# Patient Record
Sex: Male | Born: 1960
Health system: Southern US, Community
[De-identification: ages and names within clinical notes are randomized; demographics above are authoritative.]

## PROBLEM LIST (undated history)

## (undated) DIAGNOSIS — M549 Dorsalgia, unspecified: Secondary | ICD-10-CM

## (undated) DIAGNOSIS — E785 Hyperlipidemia, unspecified: Secondary | ICD-10-CM

## (undated) DIAGNOSIS — I10 Essential (primary) hypertension: Secondary | ICD-10-CM

## (undated) DIAGNOSIS — M1990 Unspecified osteoarthritis, unspecified site: Secondary | ICD-10-CM

## (undated) DIAGNOSIS — G4730 Sleep apnea, unspecified: Secondary | ICD-10-CM

## (undated) DIAGNOSIS — M25519 Pain in unspecified shoulder: Secondary | ICD-10-CM

## (undated) HISTORY — PX: WISDOM TOOTH EXTRACTION: SHX21

## (undated) HISTORY — DX: Pain in unspecified shoulder: M25.519

## (undated) HISTORY — DX: Dorsalgia, unspecified: M54.9

## (undated) HISTORY — PX: JOINT REPLACEMENT: SHX530

## (undated) HISTORY — DX: Essential (primary) hypertension: I10

## (undated) HISTORY — PX: KNEE ARTHROSCOPY: SUR90

---

## 1978-08-21 HISTORY — PX: PILONIDAL CYST EXCISION: SHX744

## 2011-12-09 LAB — HM COLONOSCOPY: HM Colonoscopy: NORMAL

## 2014-12-09 ENCOUNTER — Encounter: Payer: Self-pay | Admitting: Nurse Practitioner

## 2014-12-09 ENCOUNTER — Ambulatory Visit (INDEPENDENT_AMBULATORY_CARE_PROVIDER_SITE_OTHER): Payer: BLUE CROSS/BLUE SHIELD | Admitting: Nurse Practitioner

## 2014-12-09 DIAGNOSIS — M25561 Pain in right knee: Secondary | ICD-10-CM | POA: Diagnosis not present

## 2014-12-09 DIAGNOSIS — Z7189 Other specified counseling: Secondary | ICD-10-CM

## 2014-12-09 DIAGNOSIS — M549 Dorsalgia, unspecified: Secondary | ICD-10-CM

## 2014-12-09 DIAGNOSIS — Z7689 Persons encountering health services in other specified circumstances: Secondary | ICD-10-CM

## 2014-12-09 DIAGNOSIS — Z13220 Encounter for screening for lipoid disorders: Secondary | ICD-10-CM

## 2014-12-09 DIAGNOSIS — Z1329 Encounter for screening for other suspected endocrine disorder: Secondary | ICD-10-CM

## 2014-12-09 DIAGNOSIS — M25512 Pain in left shoulder: Secondary | ICD-10-CM | POA: Diagnosis not present

## 2014-12-09 DIAGNOSIS — Z131 Encounter for screening for diabetes mellitus: Secondary | ICD-10-CM

## 2014-12-09 DIAGNOSIS — M25519 Pain in unspecified shoulder: Secondary | ICD-10-CM

## 2014-12-09 DIAGNOSIS — M545 Low back pain, unspecified: Secondary | ICD-10-CM

## 2014-12-09 DIAGNOSIS — Z130 Encounter for screening for diseases of the blood and blood-forming organs and certain disorders involving the immune mechanism: Secondary | ICD-10-CM

## 2014-12-09 HISTORY — DX: Pain in unspecified shoulder: M25.519

## 2014-12-09 HISTORY — DX: Dorsalgia, unspecified: M54.9

## 2014-12-09 NOTE — Progress Notes (Signed)
Pre visit review using our clinic review tool, if applicable. No additional management support is needed unless otherwise documented below in the visit note. 
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
## 2014-12-09 NOTE — Patient Instructions (Signed)
Please make a fasting lab appointment before leaving.   Nothing to eat or drink after midnight- water okay  Our coordinator for referrals will be in contact about your ortho referral.   Welcome to [facility]!

## 2014-12-10 ENCOUNTER — Other Ambulatory Visit: Payer: BLUE CROSS/BLUE SHIELD

## 2014-12-15 ENCOUNTER — Other Ambulatory Visit (INDEPENDENT_AMBULATORY_CARE_PROVIDER_SITE_OTHER): Payer: BLUE CROSS/BLUE SHIELD

## 2014-12-15 DIAGNOSIS — Z0000 Encounter for general adult medical examination without abnormal findings: Secondary | ICD-10-CM | POA: Diagnosis not present

## 2014-12-15 DIAGNOSIS — E785 Hyperlipidemia, unspecified: Secondary | ICD-10-CM

## 2014-12-15 HISTORY — DX: Hyperlipidemia, unspecified: E78.5

## 2014-12-15 LAB — LIPID PANEL
Cholesterol: 188 mg/dL (ref 0–200)
HDL: 44 mg/dL (ref >39.00)
LDL CALC: 109 mg/dL — AB (ref 0–99)
NONHDL: 144
Total CHOL/HDL Ratio: 4
Triglycerides: 176 mg/dL — ABNORMAL HIGH (ref 0.0–149.0)
VLDL: 35.2 mg/dL (ref 0.0–40.0)

## 2014-12-15 LAB — CBC WITH DIFFERENTIAL/PLATELET
Basophils Absolute: 0 10*3/uL (ref 0.0–0.1)
Basophils Relative: 0.5 % (ref 0.0–3.0)
Eosinophils Absolute: 0.2 10*3/uL (ref 0.0–0.7)
Eosinophils Relative: 4.9 % (ref 0.0–5.0)
HEMATOCRIT: 46 % (ref 39.0–52.0)
HEMOGLOBIN: 15.8 g/dL (ref 13.0–17.0)
LYMPHS PCT: 28.3 % (ref 12.0–46.0)
Lymphs Abs: 1.4 10*3/uL (ref 0.7–4.0)
MCHC: 34.3 g/dL (ref 30.0–36.0)
MCV: 92.9 fl (ref 78.0–100.0)
MONOS PCT: 6.7 % (ref 3.0–12.0)
Monocytes Absolute: 0.3 10*3/uL (ref 0.1–1.0)
Neutro Abs: 2.9 10*3/uL (ref 1.4–7.7)
Neutrophils Relative %: 59.6 % (ref 43.0–77.0)
PLATELETS: 151 10*3/uL (ref 150.0–400.0)
RBC: 4.95 Mil/uL (ref 4.22–5.81)
RDW: 13 % (ref 11.5–15.5)
WBC: 4.9 10*3/uL (ref 4.0–10.5)

## 2014-12-15 LAB — COMPREHENSIVE METABOLIC PANEL
ALK PHOS: 51 U/L (ref 39–117)
ALT: 33 U/L (ref 0–53)
AST: 28 U/L (ref 0–37)
Albumin: 4.1 g/dL (ref 3.5–5.2)
BUN: 14 mg/dL (ref 6–23)
CO2: 32 mEq/L (ref 19–32)
CREATININE: 0.96 mg/dL (ref 0.40–1.50)
Calcium: 9.2 mg/dL (ref 8.4–10.5)
Chloride: 102 mEq/L (ref 96–112)
GFR: 86.21 mL/min (ref >60.00)
Glucose, Bld: 109 mg/dL — ABNORMAL HIGH (ref 70–99)
POTASSIUM: 4.3 meq/L (ref 3.5–5.1)
Sodium: 138 mEq/L (ref 135–145)
TOTAL PROTEIN: 6.4 g/dL (ref 6.0–8.3)
Total Bilirubin: 1 mg/dL (ref 0.2–1.2)

## 2014-12-15 LAB — HEMOGLOBIN A1C: Hgb A1c MFr Bld: 5.6 % (ref 4.6–6.5)

## 2014-12-15 LAB — TSH: TSH: 1.75 u[IU]/mL (ref 0.35–4.50)

## 2014-12-19 DIAGNOSIS — M25561 Pain in right knee: Secondary | ICD-10-CM | POA: Insufficient documentation

## 2014-12-19 DIAGNOSIS — Z7689 Persons encountering health services in other specified circumstances: Secondary | ICD-10-CM | POA: Insufficient documentation

## 2014-12-19 DIAGNOSIS — M25512 Pain in left shoulder: Secondary | ICD-10-CM | POA: Insufficient documentation

## 2014-12-19 DIAGNOSIS — G8929 Other chronic pain: Secondary | ICD-10-CM | POA: Insufficient documentation

## 2014-12-19 DIAGNOSIS — M545 Low back pain, unspecified: Secondary | ICD-10-CM | POA: Insufficient documentation

## 2014-12-19 DIAGNOSIS — Z0000 Encounter for general adult medical examination without abnormal findings: Secondary | ICD-10-CM | POA: Insufficient documentation

## 2014-12-19 NOTE — Assessment & Plan Note (Signed)
Referral to orthopedics due to multiple joint complaints.

## 2014-12-19 NOTE — Assessment & Plan Note (Signed)
Referral to ortho

## 2014-12-19 NOTE — Assessment & Plan Note (Signed)
Left shoulder pain uncontrolled. Pt has limited ROM and pain with overhead motions. Referral to Ortho.

## 2014-12-19 NOTE — Assessment & Plan Note (Addendum)
Discussed acute and chronic issues. Reviewed health maintenance measures, PFSHx, and immunizations. Future fasting: Obtain routine labs TSH, Lipid panel, CBC w/ diff, A1c, and CMET.

## 2015-08-06 ENCOUNTER — Ambulatory Visit (INDEPENDENT_AMBULATORY_CARE_PROVIDER_SITE_OTHER): Payer: BLUE CROSS/BLUE SHIELD | Admitting: Nurse Practitioner

## 2015-08-06 ENCOUNTER — Encounter: Payer: Self-pay | Admitting: Nurse Practitioner

## 2015-08-06 VITALS — BP 138/90 | HR 77 | Temp 97.8°F | Ht 70.0 in | Wt 259.0 lb

## 2015-08-06 DIAGNOSIS — M25561 Pain in right knee: Secondary | ICD-10-CM

## 2015-08-06 DIAGNOSIS — Z23 Encounter for immunization: Secondary | ICD-10-CM

## 2015-08-06 NOTE — Progress Notes (Signed)
Pre visit review using our clinic review tool, if applicable. No additional management support is needed unless otherwise documented below in the visit note. 

## 2015-08-06 NOTE — Patient Instructions (Addendum)
Thank you for getting your flu shot today.   Check back in 1 month to see when x-ray begins.   Happy Holidays!   Follow up for labs in the spring.

## 2015-08-06 NOTE — Progress Notes (Signed)
Patient ID: Johnny Bradley, male    DOB: [birth year]  Age: 54 y.o. MRN: [id number]  CC: No chief complaint on file.   HPI Johnny Bradley presents for CC of right knee pain 4 months.  1) Right knee pain  Onset- 4 months ago approx Location- right lateral superior around 11 o'clock Duration- few seconds Characteristics- sharp/shooting Aggravating factors- Bumping it  Relieving factors- rest Severity- 7/10 when bumped   Treatment to date; Aleve- 2 in the morning daily  Topicals over-the-counter  Patient reports gaining 20 lbs in last year  He is up 8 pounds since April according to our scale    History Cloys has a past medical history of Back pain and Shoulder pain.   He has past surgical history that includes Knee arthroscopy (Right).   His family history includes Heart disease in his father; Stroke in his paternal grandmother.He reports that he has never smoked. He does not have any smokeless tobacco history on file. He reports that he drinks about 7.2 oz of alcohol per week. He reports that he does not use illicit drugs.  Outpatient Prescriptions Prior to Visit  Medication Sig Dispense Refill  . aspirin 81 MG tablet Take 81 mg by mouth daily.    . Calcium Carbonate (CALTRATE 600 PO) Take 1 tablet by mouth daily.    . Garlic 2 MG CAPS Take 2 capsules by mouth.    . Misc Natural Products (FLEX-A-MIN JOINT FLEX PO) Take 2 tablets by mouth.    . Multiple Vitamins-Minerals (CENTRUM SILVER ADULT 50+ PO) Take 1 tablet by mouth daily.    . Naproxen Sodium (ALEVE PO) Take 2 tablets by mouth daily.    . Omega-3 Fatty Acids (FISH OIL PO) Take 1 capsule by mouth.     No facility-administered medications prior to visit.    ROS Review of Systems  Constitutional: Negative for fever, chills, diaphoresis and fatigue.  Musculoskeletal: Positive for arthralgias. Negative for back pain, joint swelling and gait problem.       Right knee    Objective:  BP 138/90 mmHg  Pulse 77   Temp(Src) 97.8 F (36.6 C) (Oral)  Ht 5\' 10"  (1.778 m)  Wt 259 lb (117.482 kg)  BMI 37.16 kg/m2  SpO2 97%  Physical Exam  Constitutional: He is oriented to person, place, and time. He appears well-developed and well-nourished. No distress.  Musculoskeletal: Normal range of motion. He exhibits no edema or tenderness.  Could not reproduce symptoms at today's visit  Neurological: He is alert and oriented to person, place, and time.  Skin: Skin is warm and dry. No rash noted. He is not diaphoretic.      Assessment & Plan:   Diagnoses and all orders for this visit:  Need for prophylactic vaccination and inoculation against influenza -     Flu Vaccine QUAD 36+ mos IM  Right knee pain   I am having Mr. Mcleish maintain his Multiple Vitamins-Minerals (CENTRUM SILVER ADULT 50+ PO), aspirin, Garlic, Calcium Carbonate (CALTRATE 600 PO), Omega-3 Fatty Acids (FISH OIL PO), Naproxen Sodium (ALEVE PO), and Misc Natural Products (FLEX-A-MIN JOINT FLEX PO).  No orders of the defined types were placed in this encounter.     Follow-up: Return in about 4 weeks (around 09/03/2015) for X-ray of right knee.

## 2015-08-10 ENCOUNTER — Telehealth: Payer: Self-pay

## 2015-08-10 NOTE — Telephone Encounter (Signed)
Called patient with to pick up completed packard form at front desk

## 2015-08-16 NOTE — Assessment & Plan Note (Signed)
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

## 2016-08-24 ENCOUNTER — Ambulatory Visit (INDEPENDENT_AMBULATORY_CARE_PROVIDER_SITE_OTHER): Payer: Self-pay

## 2016-08-24 ENCOUNTER — Ambulatory Visit (INDEPENDENT_AMBULATORY_CARE_PROVIDER_SITE_OTHER): Payer: Self-pay | Admitting: Family Medicine

## 2016-08-24 VITALS — BP 115/79 | HR 71 | Temp 97.5°F | Resp 14 | Wt 260.8 lb

## 2016-08-24 DIAGNOSIS — M545 Low back pain, unspecified: Secondary | ICD-10-CM

## 2016-08-24 DIAGNOSIS — Z23 Encounter for immunization: Secondary | ICD-10-CM

## 2016-08-24 NOTE — Patient Instructions (Signed)
Finish the prednisone.  Use the skelexin as needed.  We will call with xray results.  Consider PT.  I will see you for your physical (at your convenience).  Take care  Dr. Lacinda Axon

## 2016-08-24 NOTE — Progress Notes (Signed)
Pre visit review using our clinic review tool, if applicable. No additional management support is needed unless otherwise documented below in the visit note. 

## 2016-08-24 NOTE — Assessment & Plan Note (Signed)
Established problem, recent exacerbation. Appears to be improving on treatment with prednisone and Skelaxin. Advised to continue. X-ray today. Advise physical therapy and patient refused at this time.

## 2016-08-24 NOTE — Progress Notes (Signed)
Subjective:  Patient ID: Johnny Bradley, male    DOB: May 03, 1961  Age: 56 y.o. MRN: [id number]  CC: Back pain  HPI:  56 year old male presents with complaints of low back pain.  Low back pain  Prior history of low back pain.  Patient has had back pain past several days (started at the end of December)  Was recent seen at [facility] and was prescribed Skelexin and Prednisone.  He continues to have low back pain, particularly with certain movements. Moderate in severity.  Pain is located bilaterally in the low back.  No radiation.  No saddle anesthesia or incontinence.  He has had improvement with the above treatment.  His primary concern is the fact that this is a recurring issue for him.  Social Hx   Social History   Social History  . Marital status: Married    Spouse name: N/A  . Number of children: N/A  . Years of education: N/A   Social History Main Topics  . Smoking status: Never Smoker  . Smokeless tobacco: Not on file  . Alcohol use 7.2 oz/week    12 Cans of beer per week     Comment: On weekends- [address]   . Drug use: No  . Sexual activity: Yes    Partners: Female     Comment: Wife   Other Topics Concern  . Not on file   Social History Narrative   Computer Dept. Next door- manages paperwork    Lives at home with wife and daughter & fiance    Son who owns Medical sales representative. And has a son 10 yo    Granddaughter 8 mos from daughter    Pets: 1 cat 2 dogs    Right handed    Caffeine- none   Masters degree    Enjoys- outdoor activities, festivals       Review of Systems  Constitutional: Negative.   Musculoskeletal: Positive for back pain.   Objective:  BP 115/79   Pulse 71   Temp 97.5 F (36.4 C) (Oral)   Resp 14   Wt 260 lb 12.8 oz (118.3 kg)   SpO2 98%   BMI 37.42 kg/m   BP/Weight 08/24/2016 08/06/2015 [id number]  Systolic BP [id number]  Diastolic BP 79 90 78  Wt. (Lbs) 260.8 259 251.12  BMI 37.42 37.16 36.03   Physical Exam    Constitutional: He is oriented to person, place, and time. He appears well-developed. No distress.  Cardiovascular: Normal rate and regular rhythm.   Pulmonary/Chest: Effort normal and breath sounds normal.  Musculoskeletal:  Low back - decreased ROM. Normal muscle strength. Negative straight leg raise. Spasm of the paraspinal musculature noted.  Neurological: He is alert and oriented to person, place, and time.  Psychiatric: He has a normal mood and affect.  Vitals reviewed.  Lab Results  Component Value Date   WBC 4.9 12/15/2014   HGB 15.8 12/15/2014   HCT 46.0 12/15/2014   PLT 151.0 12/15/2014   GLUCOSE 109 (H) 12/15/2014   CHOL 188 12/15/2014   TRIG 176.0 (H) 12/15/2014   HDL 44.00 12/15/2014   LDLCALC 109 (H) 12/15/2014   ALT 33 12/15/2014   AST 28 12/15/2014   NA 138 12/15/2014   K 4.3 12/15/2014   CL 102 12/15/2014   CREATININE 0.96 12/15/2014   BUN 14 12/15/2014   CO2 32 12/15/2014   TSH 1.75 12/15/2014   HGBA1C 5.6 12/15/2014    Assessment & Plan:   Problem List  Items Addressed This Visit    Bilateral low back pain without sciatica - Primary    Established problem, recent exacerbation. Appears to be improving on treatment with prednisone and Skelaxin. Advised to continue. X-ray today. Advise physical therapy and patient refused at this time.      Relevant Orders   DG Lumbar Spine 2-3 Views (Completed)    Other Visit Diagnoses    Encounter for immunization       Relevant Orders   Flu Vaccine QUAD 36+ mos IM (Completed)     Follow-up: PRN  [place]

## 2016-09-27 DIAGNOSIS — M25461 Effusion, right knee: Secondary | ICD-10-CM | POA: Diagnosis not present

## 2016-09-27 DIAGNOSIS — M25562 Pain in left knee: Secondary | ICD-10-CM | POA: Diagnosis not present

## 2016-10-04 DIAGNOSIS — M25562 Pain in left knee: Secondary | ICD-10-CM | POA: Diagnosis not present

## 2016-10-09 DIAGNOSIS — S83232A Complex tear of medial meniscus, current injury, left knee, initial encounter: Secondary | ICD-10-CM | POA: Diagnosis not present

## 2016-10-25 DIAGNOSIS — Z01818 Encounter for other preprocedural examination: Secondary | ICD-10-CM | POA: Diagnosis not present

## 2016-10-25 DIAGNOSIS — S83232A Complex tear of medial meniscus, current injury, left knee, initial encounter: Secondary | ICD-10-CM | POA: Diagnosis not present

## 2016-11-16 DIAGNOSIS — Z7982 Long term (current) use of aspirin: Secondary | ICD-10-CM | POA: Diagnosis not present

## 2016-11-16 DIAGNOSIS — M25561 Pain in right knee: Secondary | ICD-10-CM | POA: Diagnosis not present

## 2016-11-16 DIAGNOSIS — M23232 Derangement of other medial meniscus due to old tear or injury, left knee: Secondary | ICD-10-CM | POA: Diagnosis not present

## 2016-11-16 DIAGNOSIS — M94262 Chondromalacia, left knee: Secondary | ICD-10-CM | POA: Diagnosis not present

## 2016-11-16 DIAGNOSIS — S83232A Complex tear of medial meniscus, current injury, left knee, initial encounter: Secondary | ICD-10-CM | POA: Diagnosis not present

## 2017-04-21 ENCOUNTER — Emergency Department (HOSPITAL_BASED_OUTPATIENT_CLINIC_OR_DEPARTMENT_OTHER)
Admission: EM | Admit: 2017-04-21 | Discharge: 2017-04-21 | Disposition: A | Discharge status: Home / Self Care | Payer: 59 | Attending: Emergency Medicine | Admitting: Emergency Medicine

## 2017-04-21 ENCOUNTER — Encounter (HOSPITAL_BASED_OUTPATIENT_CLINIC_OR_DEPARTMENT_OTHER): Payer: Self-pay | Admitting: Emergency Medicine

## 2017-04-21 DIAGNOSIS — Z79899 Other long term (current) drug therapy: Secondary | ICD-10-CM | POA: Insufficient documentation

## 2017-04-21 DIAGNOSIS — R300 Dysuria: Secondary | ICD-10-CM | POA: Diagnosis not present

## 2017-04-21 DIAGNOSIS — Z7982 Long term (current) use of aspirin: Secondary | ICD-10-CM | POA: Diagnosis not present

## 2017-04-21 LAB — URINALYSIS, ROUTINE W REFLEX MICROSCOPIC
Bilirubin Urine: NEGATIVE
GLUCOSE, UA: NEGATIVE mg/dL
HGB URINE DIPSTICK: NEGATIVE
Ketones, ur: NEGATIVE mg/dL
Nitrite: NEGATIVE
PH: 6.5 (ref 5.0–8.0)
Protein, ur: NEGATIVE mg/dL

## 2017-04-21 LAB — URINALYSIS, MICROSCOPIC (REFLEX)

## 2017-04-21 MED ORDER — CEPHALEXIN 500 MG PO CAPS: 500.0000 mg | ORAL_CAPSULE | Freq: Two times a day (BID) | ORAL | 0 refills | Status: AC

## 2017-04-21 MED ORDER — PHENAZOPYRIDINE HCL 200 MG PO TABS: 200.0000 mg | ORAL_TABLET | Freq: Three times a day (TID) | ORAL | 0 refills | Status: AC

## 2017-04-21 NOTE — Discharge Instructions (Signed)
Your urine is not convincing for infection but we will treat with course of antibiotics Pyridium will help your symptoms, but will turn your urine orange. This is normal. Return for worsening symptoms, including fever, abdominal pain, back pain, or any other symptoms concerning ot you.

## 2017-04-21 NOTE — ED Provider Notes (Signed)
[facility] Provider Note   CSN: [id number] Arrival date & time: 04/21/17  1255     History   Chief Complaint Chief Complaint  Patient presents with  . Dysuria    HPI Johnny Bradley is a 56 y.o. male.  The history is provided by the patient.  Dysuria   This is a new problem. The current episode started yesterday. The problem occurs every urination. The problem has not changed since onset.The quality of the pain is described as burning. The pain is mild. There has been no fever. Pertinent negatives include no chills, no sweats, no nausea, no vomiting, no discharge, no frequency, no hematuria, no hesitancy, no urgency and no flank pain. He has tried nothing for the symptoms. His past medical history does not include kidney stones, recurrent UTIs or catheterization.   56 year old male who presents with dysuria. He is otherwise healthy and has no significant past medical history. Starting yesterday evening developed burning with urination and mild urinary hesitancy. Has not had similar symptoms before. No abdominal pain, nausea or vomiting, fevers, hematuria, chills or night sweats, urinary frequency, abnormal penile discharge, flank pain. Denies any penile lesions. He has been married for a long time and denies any concerns for STDs. No diarrhea or constipation. Has not tried any medications for his symptoms. No aggravating or alleviating factors.   Past Medical History:  Diagnosis Date  . Back pain   . Shoulder pain    Left    Patient Active Problem List   Diagnosis Date Noted  . Bilateral low back pain without sciatica 12/19/2014    Past Surgical History:  Procedure Laterality Date  . KNEE ARTHROSCOPY Right    MCL Torn       Home Medications    Prior to Admission medications   Medication Sig Start Date End Date Taking? Authorizing Provider  aspirin 81 MG tablet Take 81 mg by mouth daily.    [provider]  Calcium Carbonate (CALTRATE 600 PO)  Take 1 tablet by mouth daily.    [provider]  Garlic 2 MG CAPS Take 2 capsules by mouth.    [provider]  Misc Natural Products (FLEX-A-MIN JOINT FLEX PO) Take 2 tablets by mouth.    [provider]  Multiple Vitamins-Minerals (CENTRUM SILVER ADULT 50+ PO) Take 1 tablet by mouth daily.    [provider]  Naproxen Sodium (ALEVE PO) Take 2 tablets by mouth daily.    [provider]  Omega-3 Fatty Acids (FISH OIL PO) Take 1 capsule by mouth.    [provider]    Family History Family History  Problem Relation Age of Onset  . Heart disease Father   . Stroke Paternal Grandmother     Social History Social History  Substance Use Topics  . Smoking status: Never Smoker  . Smokeless tobacco: Never Used  . Alcohol use 7.2 oz/week    12 Cans of beer per week     Comment: On weekends- [address]      Allergies   Patient has no known allergies.   Review of Systems Review of Systems  Constitutional: Negative for chills.  Gastrointestinal: Negative for nausea and vomiting.  Genitourinary: Positive for dysuria. Negative for flank pain, frequency, hematuria, hesitancy and urgency.  All other systems reviewed and are negative.    Physical Exam Updated Vital Signs BP 135/84 (BP Location: Left Arm)   Pulse 75   Temp 97.8 F (36.6 C) (Oral)  Resp 20   Ht 5\' 10"  (1.778 m)   Wt 113.4 kg (250 lb)   SpO2 100%   BMI 35.87 kg/m   Physical Exam Physical Exam  Nursing note and vitals reviewed. Constitutional: Well developed, well nourished, non-toxic, and in no acute distress Head: Normocephalic and atraumatic.  Mouth/Throat: Oropharynx is clear and moist.  Neck: Normal range of motion. Neck supple.  Cardiovascular: Normal rate and regular rhythm.   Pulmonary/Chest: Effort normal and breath sounds normal.  Abdominal: Soft. There is no tenderness. There is no rebound and no guarding.  GU: Normal external genitalia. No  lesions or discharge.  Musculoskeletal: Normal range of motion.  Neurological: Alert, no facial droop, fluent speech, moves all extremities symmetrically Skin: Skin is warm and dry.  Psychiatric: Cooperative'  ED Treatments / Results  Labs (all labs ordered are listed, but only abnormal results are displayed) Labs Reviewed  URINALYSIS, ROUTINE W REFLEX MICROSCOPIC - Abnormal; Notable for the following:       Result Value   Specific Gravity, Urine <1.005 (*)    Leukocytes, UA SMALL (*)    All other components within normal limits  URINALYSIS, MICROSCOPIC (REFLEX) - Abnormal; Notable for the following:    Bacteria, UA RARE (*)    Squamous Epithelial / LPF 0-5 (*)    All other components within normal limits  URINE CULTURE    EKG  EKG Interpretation None       Radiology No results found.  Procedures Procedures (including critical care time)  Medications Ordered in ED Medications - No data to display   Initial Impression / Assessment and Plan / ED Course  I have reviewed the triage vital signs and the nursing notes.  Pertinent labs & imaging results that were available during my care of the patient were reviewed by me and considered in my medical decision making (see chart for details).     Presents with 1-1/2 days of dysuria. Afebrile hemodynamically stable. Well-appearing and in no acute distress. Soft and benign abdomen without tenderness and no CVA tenderness. Urinalysis is not completely concerning for UTI as there is mild leukocytes and rare bacteria. There are no concerns for STDs. No exposures that could be due to chemical irritants. We'll empirically treat with a course of antibiotics and provide Pyridium. Strict return and follow-up instructions reviewed. He expressed understanding of all discharge instructions and felt comfortable with the plan of care.   Final Clinical Impressions(s) / ED Diagnoses   Final diagnoses:  Dysuria    New Prescriptions New  Prescriptions   No medications on file     Forde Dandy, MD 04/21/17 1427

## 2017-04-23 LAB — URINE CULTURE: Culture: NO GROWTH

## 2017-04-28 ENCOUNTER — Emergency Department (HOSPITAL_BASED_OUTPATIENT_CLINIC_OR_DEPARTMENT_OTHER)
Admission: EM | Admit: 2017-04-28 | Discharge: 2017-04-28 | Disposition: A | Discharge status: Home / Self Care | Payer: 59 | Attending: Emergency Medicine | Admitting: Emergency Medicine

## 2017-04-28 ENCOUNTER — Encounter (HOSPITAL_BASED_OUTPATIENT_CLINIC_OR_DEPARTMENT_OTHER): Payer: Self-pay | Admitting: *Deleted

## 2017-04-28 DIAGNOSIS — Z7982 Long term (current) use of aspirin: Secondary | ICD-10-CM | POA: Diagnosis not present

## 2017-04-28 DIAGNOSIS — Z79899 Other long term (current) drug therapy: Secondary | ICD-10-CM | POA: Insufficient documentation

## 2017-04-28 DIAGNOSIS — R300 Dysuria: Secondary | ICD-10-CM

## 2017-04-28 LAB — URINALYSIS, ROUTINE W REFLEX MICROSCOPIC
BILIRUBIN URINE: NEGATIVE
GLUCOSE, UA: NEGATIVE mg/dL
Hgb urine dipstick: NEGATIVE
Ketones, ur: 15 mg/dL — AB
Leukocytes, UA: NEGATIVE
NITRITE: NEGATIVE
PH: 6 (ref 5.0–8.0)
PROTEIN: NEGATIVE mg/dL
SPECIFIC GRAVITY, URINE: 1.015 (ref 1.005–1.030)

## 2017-04-28 MED ORDER — LIDOCAINE HCL 1 % IJ SOLN
INTRAMUSCULAR | Status: AC
  Administered 2017-04-28: 0.9 mL
  Filled 2017-04-28: qty 10

## 2017-04-28 MED ORDER — PHENAZOPYRIDINE HCL 200 MG PO TABS: 200.0000 mg | ORAL_TABLET | Freq: Three times a day (TID) | ORAL | 0 refills | Status: DC

## 2017-04-28 MED ORDER — AZITHROMYCIN 250 MG PO TABS
1000.0000 mg | ORAL_TABLET | Freq: Once | ORAL | Status: AC
Start: 2017-04-28 — End: 2017-04-28
  Administered 2017-04-28: 1000 mg via ORAL
  Filled 2017-04-28: qty 4

## 2017-04-28 MED ORDER — CEFTRIAXONE SODIUM 250 MG IJ SOLR
250.0000 mg | Freq: Once | INTRAMUSCULAR | Status: AC
Start: 2017-04-28 — End: 2017-04-28
  Administered 2017-04-28: 250 mg via INTRAMUSCULAR
  Filled 2017-04-28: qty 250

## 2017-04-28 NOTE — ED Triage Notes (Signed)
Pt reports dx of UTI 1 wk ago -- finished antibiotics yesterday (states symptoms never fully resolved). Reports this morning he began experiencing severe dysuria. Denies frequency, urgency, hematuria. Denies penile drainage. Denies fever.

## 2017-04-28 NOTE — ED Provider Notes (Signed)
[facility] DEPT [facility] Provider Note   CSN: [id number] Arrival date & time: 04/28/17  1759     History   Chief Complaint Chief Complaint  Patient presents with  . Dysuria    HPI Johnny Bradley is a 56 y.o. male.  HPI  56 y.o. male, presents to the Emergency Department today due to dysuria x 1 week. Notes seen in ED on 04-21-17 for same. UA unremarkable at the time. Culture sent. Treated with ABX. Noted symptoms never fully resolved. Did not improvement initially, but returned. Noted pyridium aided the most. Noted experiencing severe dysuria this morning. No frequency. No discharge. No urgency. No hematuria. No fevers. Pt unsure of cause of dysuria. PT has been sexually active with wife for many years and they have been faithful. Denies trauma to urethra. No other symptoms noted.      Past Medical History:  Diagnosis Date  . Back pain   . Shoulder pain    Left    Patient Active Problem List   Diagnosis Date Noted  . Bilateral low back pain without sciatica 12/19/2014    Past Surgical History:  Procedure Laterality Date  . KNEE ARTHROSCOPY Right    MCL Torn       Home Medications    Prior to Admission medications   Medication Sig Start Date End Date Taking? Authorizing Provider  aspirin 81 MG tablet Take 81 mg by mouth daily.   Yes [provider]  Cholecalciferol (VITAMIN D) 2000 units CAPS Take by mouth.   Yes [provider]  Multiple Vitamins-Minerals (CENTRUM SILVER ADULT 50+ PO) Take 1 tablet by mouth daily.   Yes [provider]  Omega-3 Fatty Acids (FISH OIL PO) Take 1 capsule by mouth.   Yes [provider]  Calcium Carbonate (CALTRATE 600 PO) Take 1 tablet by mouth daily.    [provider]  cephALEXin (KEFLEX) 500 MG capsule Take 1 capsule (500 mg total) by mouth 2 (two) times daily. 04/21/17 04/28/17  Forde Dandy, MD  Garlic 2 MG CAPS Take 2 capsules by mouth.    [provider]  Misc Natural Products  (FLEX-A-MIN JOINT FLEX PO) Take 2 tablets by mouth.    [provider]  Naproxen Sodium (ALEVE PO) Take 2 tablets by mouth daily.    [provider]    Family History Family History  Problem Relation Age of Onset  . Heart disease Father   . Stroke Paternal Grandmother     Social History Social History  Substance Use Topics  . Smoking status: Never Smoker  . Smokeless tobacco: Never Used  . Alcohol use 7.2 oz/week    12 Cans of beer per week     Comment: On weekends- [employer] 62      Allergies   Patient has no known allergies.   Review of Systems Review of Systems ROS reviewed and all are negative for acute change except as noted in the HPI.  Physical Exam Updated Vital Signs BP (!) 130/106 (BP Location: Right Arm)   Pulse 88   Temp 98.1 F (36.7 C) (Oral)   Resp 20   Ht 5\' 10"  (1.778 m)   Wt 110.2 kg (243 lb)   SpO2 98%   BMI 34.87 kg/m   Physical Exam  Constitutional: He is oriented to person, place, and time. Vital signs are normal. He appears well-developed and well-nourished.  HENT:  Head: Normocephalic and atraumatic.  Right Ear: Hearing normal.  Left Ear: Hearing normal.  Eyes: Pupils are equal, round, and reactive to light. Conjunctivae and EOM are normal.  Neck: Normal range of motion. Neck supple.  Cardiovascular: Normal rate and regular rhythm.   Pulmonary/Chest: Effort normal.  Genitourinary: Testes normal. Cremasteric reflex is present. Circumcised. No phimosis, paraphimosis, hypospadias, penile erythema or penile tenderness. No discharge found.  Musculoskeletal: Normal range of motion.  Neurological: He is alert and oriented to person, place, and time.  Skin: Skin is warm and dry.  Psychiatric: He has a normal mood and affect. His speech is normal and behavior is normal. Thought content normal.  Nursing note and vitals reviewed.    ED Treatments / Results  Labs (all labs ordered are listed, but only abnormal results are  displayed) Labs Reviewed  URINALYSIS, ROUTINE W REFLEX MICROSCOPIC - Abnormal; Notable for the following:       Result Value   Ketones, ur 15 (*)    All other components within normal limits    EKG  EKG Interpretation None       Radiology No results found.  Procedures Procedures (including critical care time)  Medications Ordered in ED Medications  cefTRIAXone (ROCEPHIN) injection 250 mg (not administered)  azithromycin (ZITHROMAX) tablet 1,000 mg (not administered)     Initial Impression / Assessment and Plan / ED Course  I have reviewed the triage vital signs and the nursing notes.  Pertinent labs & imaging results that were available during my care of the patient were reviewed by me and considered in my medical decision making (see chart for details).  Final Clinical Impressions(s) / ED Diagnoses  {I have reviewed and evaluated the relevant laboratory values.   {I have reviewed the relevant previous healthcare records.  {I obtained HPI from historian. {Patient discussed with supervising physician.  ED Course:  Assessment: Pt is a 56 y.o. male presents to the Emergency Department today due to dysuria x 1 week. Notes seen in ED on 04-21-17 for same. UA unremarkable at the time. Culture sent. Treated with ABX. Noted symptoms never fully resolved. Did not improvement initially, but returned. Noted pyridium aided the most. Noted experiencing severe dysuria this morning. No frequency. No discharge. No urgency. No hematuria. No fevers. Pt unsure of cause of dysuria. PT has been sexually active with wife for many years and they have been faithful. Denies trauma to urethra. On exam, pt in NAD. Nontoxic/nonseptic appearing. VSS. Afebrile. GU exam unremarkable. GC obtained,. UA unremarkable. Doubt UTI. Will treat for STD prophylactic. Given Pyridium. If not improvement, will refer to urology for evaluation. Plan is to DC home with follow up. At time of discharge, Patient is in no acute  distress. Vital Signs are stable. Patient is able to ambulate. Patient able to tolerate PO.   Disposition/Plan:  DC Home Additional Verbal discharge instructions given and discussed with patient.  Pt Instructed to f/u with PCP in the next week for evaluation and treatment of symptoms. Return precautions given Pt acknowledges and agrees with plan  Supervising Physician Charlesetta Shanks, MD  Final diagnoses:  Dysuria    New Prescriptions New Prescriptions   No medications on file     Conni Slipper 04/28/17 Minus Liberty, MD 05/08/17 1005

## 2017-04-28 NOTE — Discharge Instructions (Signed)
Please read and follow all provided instructions.  Your diagnoses today include:  1. Dysuria     Tests performed today include: Vital signs. See below for your results today.   Medications prescribed:  Take as prescribed   Home care instructions:  Follow any educational materials contained in this packet.  Follow-up instructions: Please follow-up with [facility] for further evaluation of symptoms and treatment   Return instructions:  Please return to the Emergency Department if you do not get better, if you get worse, or new symptoms OR  - Fever (temperature greater than 101.74F)  - Bleeding that does not stop with holding pressure to the area    -Severe pain (please note that you may be more sore the day after your accident)  - Chest Pain  - Difficulty breathing  - Severe nausea or vomiting  - Inability to tolerate food and liquids  - Passing out  - Skin becoming red around your wounds  - Change in mental status (confusion or lethargy)  - New numbness or weakness    Please return if you have any other emergent concerns.  Additional Information:  Your vital signs today were: BP (!) 130/106 (BP Location: Right Arm)    Pulse 88    Temp 98.1 F (36.7 C) (Oral)    Resp 20    Ht 5\' 10"  (1.778 m)    Wt 110.2 kg (243 lb)    SpO2 98%    BMI 34.87 kg/m  If your blood pressure (BP) was elevated above 135/85 this visit, please have this repeated by your doctor within one month. ---------------

## 2017-04-30 LAB — GC/CHLAMYDIA PROBE AMP (~~LOC~~) NOT AT ARMC
CHLAMYDIA, DNA PROBE: NEGATIVE
Neisseria Gonorrhea: NEGATIVE

## 2017-05-22 DIAGNOSIS — R300 Dysuria: Secondary | ICD-10-CM | POA: Diagnosis not present

## 2017-05-22 DIAGNOSIS — N410 Acute prostatitis: Secondary | ICD-10-CM | POA: Diagnosis not present

## 2017-05-25 ENCOUNTER — Encounter: Payer: Self-pay | Admitting: Family Medicine

## 2017-05-25 ENCOUNTER — Telehealth: Payer: Self-pay | Admitting: Radiology

## 2017-05-25 ENCOUNTER — Ambulatory Visit (INDEPENDENT_AMBULATORY_CARE_PROVIDER_SITE_OTHER): Payer: 59 | Admitting: Family Medicine

## 2017-05-25 VITALS — BP 112/80 | HR 74 | Temp 97.8°F | Ht 70.0 in | Wt 257.8 lb

## 2017-05-25 DIAGNOSIS — N419 Inflammatory disease of prostate, unspecified: Secondary | ICD-10-CM | POA: Diagnosis not present

## 2017-05-25 DIAGNOSIS — Z130 Encounter for screening for diseases of the blood and blood-forming organs and certain disorders involving the immune mechanism: Secondary | ICD-10-CM | POA: Diagnosis not present

## 2017-05-25 DIAGNOSIS — Z13220 Encounter for screening for lipoid disorders: Secondary | ICD-10-CM | POA: Diagnosis not present

## 2017-05-25 DIAGNOSIS — E6609 Other obesity due to excess calories: Secondary | ICD-10-CM

## 2017-05-25 DIAGNOSIS — Z23 Encounter for immunization: Secondary | ICD-10-CM

## 2017-05-25 DIAGNOSIS — Z1211 Encounter for screening for malignant neoplasm of colon: Secondary | ICD-10-CM

## 2017-05-25 DIAGNOSIS — Z789 Other specified health status: Secondary | ICD-10-CM

## 2017-05-25 DIAGNOSIS — B1910 Unspecified viral hepatitis B without hepatic coma: Secondary | ICD-10-CM | POA: Diagnosis not present

## 2017-05-25 DIAGNOSIS — Z0001 Encounter for general adult medical examination with abnormal findings: Secondary | ICD-10-CM

## 2017-05-25 DIAGNOSIS — Z6836 Body mass index (BMI) 36.0-36.9, adult: Secondary | ICD-10-CM | POA: Diagnosis not present

## 2017-05-25 DIAGNOSIS — R17 Unspecified jaundice: Secondary | ICD-10-CM

## 2017-05-25 DIAGNOSIS — R251 Tremor, unspecified: Secondary | ICD-10-CM | POA: Diagnosis not present

## 2017-05-25 DIAGNOSIS — Z125 Encounter for screening for malignant neoplasm of prostate: Secondary | ICD-10-CM | POA: Diagnosis not present

## 2017-05-25 DIAGNOSIS — R6883 Chills (without fever): Secondary | ICD-10-CM | POA: Insufficient documentation

## 2017-05-25 DIAGNOSIS — Z1329 Encounter for screening for other suspected endocrine disorder: Secondary | ICD-10-CM | POA: Diagnosis not present

## 2017-05-25 DIAGNOSIS — D696 Thrombocytopenia, unspecified: Secondary | ICD-10-CM

## 2017-05-25 DIAGNOSIS — E669 Obesity, unspecified: Secondary | ICD-10-CM | POA: Insufficient documentation

## 2017-05-25 DIAGNOSIS — Z1159 Encounter for screening for other viral diseases: Secondary | ICD-10-CM

## 2017-05-25 DIAGNOSIS — M21619 Bunion of unspecified foot: Secondary | ICD-10-CM | POA: Diagnosis not present

## 2017-05-25 HISTORY — DX: Encounter for screening for malignant neoplasm of colon: Z12.11

## 2017-05-25 LAB — COMPREHENSIVE METABOLIC PANEL
ALK PHOS: 51 U/L (ref 39–117)
ALT: 28 U/L (ref 0–53)
AST: 23 U/L (ref 0–37)
Albumin: 4.4 g/dL (ref 3.5–5.2)
BUN: 13 mg/dL (ref 6–23)
CO2: 28 mEq/L (ref 19–32)
Calcium: 9.2 mg/dL (ref 8.4–10.5)
Chloride: 99 mEq/L (ref 96–112)
Creatinine, Ser: 0.89 mg/dL (ref 0.40–1.50)
GFR: 93.26 mL/min (ref >60.00)
GLUCOSE: 86 mg/dL (ref 70–99)
POTASSIUM: 3.7 meq/L (ref 3.5–5.1)
Sodium: 137 mEq/L (ref 135–145)
TOTAL PROTEIN: 7 g/dL (ref 6.0–8.3)
Total Bilirubin: 1.3 mg/dL — ABNORMAL HIGH (ref 0.2–1.2)

## 2017-05-25 LAB — CBC
HEMATOCRIT: 48.1 % (ref 39.0–52.0)
HEMOGLOBIN: 16.1 g/dL (ref 13.0–17.0)
MCHC: 33.5 g/dL (ref 30.0–36.0)
MCV: 95.2 fl (ref 78.0–100.0)
Platelets: 142 10*3/uL — ABNORMAL LOW (ref 150.0–400.0)
RBC: 5.05 Mil/uL (ref 4.22–5.81)
RDW: 12.8 % (ref 11.5–15.5)
WBC: 5.5 10*3/uL (ref 4.0–10.5)

## 2017-05-25 LAB — TSH: TSH: 2.27 u[IU]/mL (ref 0.35–4.50)

## 2017-05-25 LAB — LDL CHOLESTEROL, DIRECT: Direct LDL: 142 mg/dL

## 2017-05-25 LAB — LIPID PANEL
CHOLESTEROL: 218 mg/dL — AB (ref 0–200)
HDL: 45.3 mg/dL (ref >39.00)
NONHDL: 172.89
TRIGLYCERIDES: 207 mg/dL — AB (ref 0.0–149.0)
Total CHOL/HDL Ratio: 5
VLDL: 41.4 mg/dL — ABNORMAL HIGH (ref 0.0–40.0)

## 2017-05-25 LAB — HEMOGLOBIN A1C: HEMOGLOBIN A1C: 5.7 % (ref 4.6–6.5)

## 2017-05-25 LAB — PSA: PSA: 1.35 ng/mL (ref 0.10–4.00)

## 2017-05-25 NOTE — Telephone Encounter (Signed)
Ok urine will be discarded. Thank you.

## 2017-05-25 NOTE — Telephone Encounter (Signed)
PT stated he was suppose to give urine specimen today as well? Please place order if needed.

## 2017-05-25 NOTE — Assessment & Plan Note (Addendum)
Patient with intermittent sensation of chills for some time now. No other symptoms with this. Notes it'll hit him out of nowhere. We'll check lab work as outlined below to evaluate for potential causes.

## 2017-05-25 NOTE — Patient Instructions (Signed)
Nice to meet you. Please start working on exercise as we discussed. We will get lab work as outlined. We will refer you to a podiatrist.

## 2017-05-25 NOTE — Assessment & Plan Note (Signed)
Recently treated for prostatitis. Asymptomatic currently. Monitor for recurrence of symptoms.

## 2017-05-25 NOTE — Assessment & Plan Note (Signed)
Physical exam completed. Recent prostate exam by urology. PSA today. Hepatitis C testing as well. Encourage diet and exercise. Monitor alcohol intake. Flu shot and tetanus vaccine given. Labs as outlined below.

## 2017-05-25 NOTE — Progress Notes (Signed)
Johnny Rumps, MD Phone: [phone]  Johnny Bradley is a 56 y.o. male who presents today for physical exam.  Tries to stay active by walking. Has had multiple arthroscopic surgeries on his knees and that makes it difficult for him to run. He is going to try to use the elliptical. Tries to drink a juice made with kale, spinach, Nino Glow, and vegetable protein with fruit in the morning. Some home cooked meals though does eat out multiple times a week. No prior hepatitis C testing. HIV testing when he was in the [employer]. Due for tetanus vaccination and flu shot. Colonoscopy up-to-date. Due for PSA. No tobacco use. Drinks low alcohol content beer less than 3% alcohol on the weekend. No illicit drug use. Has a bunion on his right foot that has been uncomfortable when he wears tight shoes. He's gotten his new shoes stretched out and this has been beneficial. He also has onychomycosis.  Patient reports tremor only when he drinks with his right hand. No tremor with eating or other activities. No other neurological issues.  He reports occasional chills that come out of nowhere. Occur numerous times per day. Has been intermittently going on for some time.  He saw urology recently for dysuria. Had been treated for STDs in the emergency room though STD testing came back negative. They advised is likely prostatitis.  Active Ambulatory Problems    Diagnosis Date Noted  . Bilateral low back pain without sciatica 12/19/2014  . Encounter for general adult medical examination with abnormal findings 05/25/2017  . Bunion 05/25/2017  . Class 2 obesity due to excess calories without serious comorbidity with body mass index (BMI) of 36.0 to 36.9 in adult 05/25/2017  . Tremor 05/25/2017  . Chill 05/25/2017  . Prostatitis 05/25/2017   Resolved Ambulatory Problems    Diagnosis Date Noted  . Left shoulder pain 12/19/2014  . Right knee pain 12/19/2014  . Routine general medical examination at a  health care facility 12/19/2014  . Encounter to establish care 12/19/2014   Past Medical History:  Diagnosis Date  . Back pain   . Shoulder pain     Family History  Problem Relation Age of Onset  . Heart disease Father   . Stroke Paternal Grandmother     Social History   Social History  . Marital status: Married    Spouse name: N/A  . Number of children: N/A  . Years of education: N/A   Occupational History  . Not on file.   Social History Main Topics  . Smoking status: Never Smoker  . Smokeless tobacco: Never Used  . Alcohol use 7.2 oz/week    12 Cans of beer per week     Comment: On weekends- [employer] [address]   . Drug use: No  . Sexual activity: Yes    Partners: Female     Comment: Wife   Other Topics Concern  . Not on file   Social History Narrative   Computer Dept. Next door- manages paperwork    Lives at home with wife and daughter & fiance    Son who owns Medical sales representative. And has a son 50 yo    Granddaughter 8 mos from daughter    Pets: 1 cat 2 dogs    Right handed    Caffeine- none   Masters degree    Enjoys- outdoor activities, festivals        ROS  General:  Negative for nexplained weight loss, fever Skin: Negative for new or  changing mole, sore that won't heal HEENT: Negative for trouble hearing, trouble seeing, ringing in ears, mouth sores, hoarseness, change in voice, dysphagia. CV:  Negative for chest pain, dyspnea, edema, palpitations Resp: Negative for cough, dyspnea, hemoptysis GI: Negative for nausea, vomiting, diarrhea, constipation, abdominal pain, melena, hematochezia. GU: Positive for dysuria, Negative for incontinence, urinary hesitance, hematuria, vaginal or penile discharge, polyuria, sexual difficulty, lumps in testicle or breasts MSK: Negative for muscle cramps or aches, joint pain or swelling Neuro: Negative for headaches, weakness, numbness, dizziness, passing out/fainting Psych: Negative for depression, anxiety, memory  problems  Objective  Physical Exam Vitals:   05/25/17 1412  BP: 112/80  Pulse: 74  Temp: 97.8 F (36.6 C)  SpO2: 97%    BP Readings from Last 3 Encounters:  05/25/17 112/80  04/28/17 (!) 130/106  04/21/17 (!) 149/97   Wt Readings from Last 3 Encounters:  05/25/17 257 lb 12.8 oz (116.9 kg)  04/28/17 243 lb (110.2 kg)  04/21/17 250 lb (113.4 kg)    Physical Exam  Constitutional: No distress.  HENT:  Head: Normocephalic and atraumatic.  Mouth/Throat: Oropharynx is clear and moist. No oropharyngeal exudate.  Eyes: Pupils are equal, round, and reactive to light. Conjunctivae are normal.  Cardiovascular: Normal rate, regular rhythm and normal heart sounds.   Pulmonary/Chest: Effort normal and breath sounds normal.  Abdominal: Soft. Bowel sounds are normal. He exhibits no distension. There is no tenderness. There is no rebound and no guarding.  Musculoskeletal: He exhibits no edema.  Hallux valgus right foot with no tenderness, onychomycosis 4 out of 5 toenails on the right foot  Neurological: He is alert.  CN 2-12 intact, 5/5 strength in bilateral biceps, triceps, grip, quads, hamstrings, plantar and dorsiflexion, sensation to light touch intact in bilateral UE and LE, normal gait, normal finger to nose, normal rapid alternating movements, slight tremor when patient drank with his right hand using a water bottle  Skin: Skin is warm and dry. He is not diaphoretic.  Psychiatric: Mood and affect normal.     Assessment/Plan:   Encounter for general adult medical examination with abnormal findings Physical exam completed. Recent prostate exam by urology. PSA today. Hepatitis C testing as well. Encourage diet and exercise. Monitor alcohol intake. Flu shot and tetanus vaccine given. Labs as outlined below.  Bunion Patient with bunion and onychomycosis. Referral placed to podiatry.  Class 2 obesity due to excess calories without serious comorbidity with body mass index (BMI) of  36.0 to 36.9 in adult Discussed diet and exercise.  Tremor Suspect benign essential tremor given that only occurs with use of right hand when drinking. No other symptoms. Neurologically intact otherwise. Discussed monitoring versus referral to neurology for further evaluation. Patient opted to monitor for several more weeks and if worsens or is unchanged we'll refer to neurology.  Chill Patient with intermittent sensation of chills for some time now. No other symptoms with this. Notes it'll hit him out of nowhere. We'll check lab work as outlined below to evaluate for potential causes.  Prostatitis Recently treated for prostatitis. Asymptomatic currently. Monitor for recurrence of symptoms.   Orders Placed This Encounter  Procedures  . Flu Vaccine QUAD 36+ mos IM  . Tdap vaccine greater than or equal to 7yo IM  . CBC    Standing Status:   Future    Number of Occurrences:   1    Standing Expiration Date:   05/25/2018  . Comp Met (CMET)    Standing Status:  Future    Number of Occurrences:   1    Standing Expiration Date:   05/25/2018  . HgB A1c    Standing Status:   Future    Number of Occurrences:   1    Standing Expiration Date:   05/25/2018  . Lipid panel    Standing Status:   Future    Number of Occurrences:   1    Standing Expiration Date:   05/25/2018  . PSA    Standing Status:   Future    Number of Occurrences:   1    Standing Expiration Date:   05/25/2018  . Hepatitis B surface antibody    Standing Status:   Future    Number of Occurrences:   1    Standing Expiration Date:   05/25/2018  . TSH    Standing Status:   Future    Number of Occurrences:   1    Standing Expiration Date:   05/25/2018  . Hepatitis C Antibody    Standing Status:   Future    Number of Occurrences:   1    Standing Expiration Date:   05/25/2018  . LDL cholesterol, direct  . Ambulatory referral to Podiatry    Referral Priority:   Routine    Referral Type:   Consultation    Referral Reason:    Specialty Services Required    Requested Specialty:   Podiatry    Number of Visits Requested:   1    Meds ordered this encounter  Medications  . Nutritional Supplements (JUICE PLUS FIBRE PO)    Sig: Take by mouth.     Johnny Rumps, MD [place]

## 2017-05-25 NOTE — Telephone Encounter (Signed)
We did not discuss doing a urine sample. Thanks.

## 2017-05-25 NOTE — Assessment & Plan Note (Signed)
Discussed diet and exercise 

## 2017-05-25 NOTE — Assessment & Plan Note (Signed)
Patient with bunion and onychomycosis. Referral placed to podiatry.

## 2017-05-25 NOTE — Assessment & Plan Note (Signed)
Suspect benign essential tremor given that only occurs with use of right hand when drinking. No other symptoms. Neurologically intact otherwise. Discussed monitoring versus referral to neurology for further evaluation. Patient opted to monitor for several more weeks and if worsens or is unchanged we'll refer to neurology.

## 2017-05-26 LAB — HEPATITIS B SURFACE ANTIBODY,QUALITATIVE: Hep B S Ab: REACTIVE — AB

## 2017-05-26 LAB — HEPATITIS C ANTIBODY
HEP C AB: NONREACTIVE
SIGNAL TO CUT-OFF: 0.01 (ref <1.00)

## 2017-05-26 NOTE — Progress Notes (Signed)
The 10-year ASCVD risk score Mikey Bussing DC Brooke Bonito., et al., 2013) is: 6.8%   Values used to calculate the score:     Age: 56 years     Sex: Male     Is Non-Hispanic African American: No     Diabetic: No     Tobacco smoker: No     Systolic Blood Pressure: 664 mmHg     Is BP treated: No     HDL Cholesterol: 45.3 mg/dL     Total Cholesterol: 218 mg/dL

## 2017-05-26 NOTE — Addendum Note (Signed)
Addended by: Caryl Bis, Tyshawna Alarid G on: 05/26/2017 01:11 PM   Modules accepted: Orders

## 2017-06-11 ENCOUNTER — Ambulatory Visit: Payer: Self-pay | Admitting: Podiatry

## 2017-06-12 ENCOUNTER — Ambulatory Visit (INDEPENDENT_AMBULATORY_CARE_PROVIDER_SITE_OTHER): Payer: 59 | Admitting: Podiatry

## 2017-06-12 VITALS — BP 121/81 | HR 76 | Ht 70.0 in | Wt 250.0 lb

## 2017-06-12 DIAGNOSIS — M2011 Hallux valgus (acquired), right foot: Secondary | ICD-10-CM | POA: Diagnosis not present

## 2017-06-12 DIAGNOSIS — B351 Tinea unguium: Secondary | ICD-10-CM | POA: Diagnosis not present

## 2017-06-12 DIAGNOSIS — M79674 Pain in right toe(s): Secondary | ICD-10-CM

## 2017-06-12 DIAGNOSIS — M79675 Pain in left toe(s): Secondary | ICD-10-CM | POA: Diagnosis not present

## 2017-06-12 NOTE — Progress Notes (Signed)
   Subjective:    Patient ID: Johnny Bradley, male    DOB: [birth year], 58 y.o.   MRN: [id number]  HPI This patient presents to the office with two complaints.  He says he has the bunion on his right foot for over one year.  He says the bunion became painful wearing his new boots.  He says once the boots were stretched he was able to wear his boots with no pain in his bunion.  He says the second problem is he has developed fungus in his nails 3R and 5 B/L.  He says the nails are not painful but he believes they are insightly.  No drainage from his nails  He presents to the office today for further evaluation of his feet.   Chief Complaint  Patient presents with  . Bunions    right foot/very painful/hard to find boots or shoes that are comfortable  . Nail Problem    bilateral fungus        Review of Systems  All other systems reviewed and are negative.      Objective:   Physical Exam General Appearance  Alert, conversant and in no acute stress.  Vascular  Dorsalis pedis and posterior pulses are palpable  bilaterally.  Capillary return is within normal limits  Bilaterally. Temperature is within normal limits  Bilaterally  Neurologic  Senn-Weinstein monofilament wire test within normal limits  bilaterally. Muscle power  Within normal limits bilaterally.  Nails Thick disfigured discolored nails with subungual debris noted  3 right and 5th  B/L.Marland Kitchen No evidence of bacterial infection or drainage bilaterally.  Orthopedic  No limitations of motion of motion feet bilaterally.  No crepitus or effusions noted. Mild dorsomedial exostosis  1st MPJ  Right foot.  Normal  ROM noted.  Skin  normotropic skin with no porokeratosis noted bilaterally.  No signs of infections or ulcers noted.            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  Onychomycosis  B/L   IE  Discussed his asymptomatic bunion with this patient.  Discussed treatment of his fungal nails and we discussed treating his nails  with laser.  Patient is to make an appointment for laser treatment with [facility].   Gardiner Barefoot DPM

## 2017-06-19 ENCOUNTER — Ambulatory Visit (INDEPENDENT_AMBULATORY_CARE_PROVIDER_SITE_OTHER): Payer: 59 | Admitting: Podiatry

## 2017-06-19 DIAGNOSIS — M79674 Pain in right toe(s): Secondary | ICD-10-CM

## 2017-06-19 DIAGNOSIS — B351 Tinea unguium: Secondary | ICD-10-CM

## 2017-06-19 DIAGNOSIS — M79675 Pain in left toe(s): Secondary | ICD-10-CM

## 2017-06-20 ENCOUNTER — Ambulatory Visit: Payer: Self-pay | Admitting: Podiatry

## 2017-06-20 NOTE — Progress Notes (Signed)
Pt presents with mycotic infection of nails 1-5 bilateral  All other systems are negative  Laser therapy administered to affected nails and tolerated well. All safety precautions were in place. Re-appointed in 4 weeks for 2nd treatment   Gardiner Barefoot DPM

## 2017-07-17 ENCOUNTER — Ambulatory Visit: Payer: 59 | Admitting: Podiatry

## 2017-07-17 DIAGNOSIS — B351 Tinea unguium: Secondary | ICD-10-CM

## 2017-07-17 DIAGNOSIS — M2011 Hallux valgus (acquired), right foot: Secondary | ICD-10-CM

## 2017-07-17 DIAGNOSIS — M79674 Pain in right toe(s): Principal | ICD-10-CM

## 2017-07-17 DIAGNOSIS — M79675 Pain in left toe(s): Secondary | ICD-10-CM

## 2017-08-06 NOTE — Progress Notes (Signed)
Pt presents with mycotic infection of nails 1-5 bilateral  All other systems are negative  Laser therapy administered to affected nails and tolerated well. All safety precautions were in place. Re-appointed in 4 weeks for 3rd treatment   Gardiner Barefoot DPM

## 2017-08-07 ENCOUNTER — Encounter: Payer: Self-pay | Admitting: Podiatry

## 2017-08-15 ENCOUNTER — Ambulatory Visit (INDEPENDENT_AMBULATORY_CARE_PROVIDER_SITE_OTHER): Payer: Self-pay

## 2017-08-15 DIAGNOSIS — M79675 Pain in left toe(s): Secondary | ICD-10-CM

## 2017-08-15 DIAGNOSIS — B351 Tinea unguium: Secondary | ICD-10-CM

## 2017-08-15 DIAGNOSIS — M79674 Pain in right toe(s): Secondary | ICD-10-CM

## 2017-08-30 NOTE — Progress Notes (Signed)
Pt presents with mycotic infection of nails 1-5 bilateral  All other systems are negative  Laser therapy administered to affected nails and tolerated well. All safety precautions were in place. Re-appointed in 4 weeks for 4th treatment   Gardiner Barefoot DPM

## 2017-09-17 ENCOUNTER — Ambulatory Visit (INDEPENDENT_AMBULATORY_CARE_PROVIDER_SITE_OTHER): Payer: Self-pay | Admitting: Podiatry

## 2017-09-17 DIAGNOSIS — B351 Tinea unguium: Secondary | ICD-10-CM

## 2017-09-17 DIAGNOSIS — M79675 Pain in left toe(s): Secondary | ICD-10-CM

## 2017-09-17 DIAGNOSIS — M79674 Pain in right toe(s): Secondary | ICD-10-CM

## 2017-09-17 NOTE — Progress Notes (Signed)
Pt presents with mycotic infection of nails 1-5 bilateral  All other systems are negative  Laser therapy administered to affected nails and tolerated well. All safety precautions were in place. Re-appointed in 4 weeks for 5th treatment   Gardiner Barefoot DPM

## 2017-10-16 ENCOUNTER — Ambulatory Visit: Payer: 59

## 2017-10-16 DIAGNOSIS — M79675 Pain in left toe(s): Secondary | ICD-10-CM

## 2017-10-16 DIAGNOSIS — B351 Tinea unguium: Secondary | ICD-10-CM

## 2017-10-16 DIAGNOSIS — M79674 Pain in right toe(s): Secondary | ICD-10-CM

## 2017-10-22 NOTE — Progress Notes (Signed)
Pt presents with mycotic infection of nails 1-5 bilateral  All other systems are negative  Laser therapy administered to affected nails and tolerated well. All safety precautions were in place. Re-appointed in 4 weeks for 6th treatment   Gardiner Barefoot DPM

## 2017-11-13 ENCOUNTER — Ambulatory Visit (INDEPENDENT_AMBULATORY_CARE_PROVIDER_SITE_OTHER): Payer: 59

## 2017-11-13 DIAGNOSIS — M79675 Pain in left toe(s): Secondary | ICD-10-CM

## 2017-11-13 DIAGNOSIS — B351 Tinea unguium: Secondary | ICD-10-CM

## 2017-11-13 DIAGNOSIS — M2011 Hallux valgus (acquired), right foot: Secondary | ICD-10-CM

## 2017-11-13 DIAGNOSIS — M79674 Pain in right toe(s): Secondary | ICD-10-CM

## 2017-11-20 NOTE — Progress Notes (Signed)
Pt presents with mycotic infection of nails 1-5 bilateral  All other systems are negative  Laser therapy administered to affected nails and tolerated well. All safety precautions were in place. Re-appointed prn   Gardiner Barefoot DPM

## 2017-12-03 ENCOUNTER — Other Ambulatory Visit: Payer: Self-pay | Admitting: Family Medicine

## 2017-12-03 ENCOUNTER — Encounter: Payer: Self-pay | Admitting: Family Medicine

## 2017-12-03 ENCOUNTER — Ambulatory Visit (INDEPENDENT_AMBULATORY_CARE_PROVIDER_SITE_OTHER): Payer: 59 | Admitting: Family Medicine

## 2017-12-03 VITALS — BP 150/98 | HR 70 | Temp 97.8°F | Resp 18 | Wt 253.4 lb

## 2017-12-03 DIAGNOSIS — R42 Dizziness and giddiness: Secondary | ICD-10-CM | POA: Diagnosis not present

## 2017-12-03 DIAGNOSIS — I10 Essential (primary) hypertension: Secondary | ICD-10-CM | POA: Insufficient documentation

## 2017-12-03 DIAGNOSIS — H6123 Impacted cerumen, bilateral: Secondary | ICD-10-CM

## 2017-12-03 DIAGNOSIS — H6120 Impacted cerumen, unspecified ear: Secondary | ICD-10-CM | POA: Insufficient documentation

## 2017-12-03 HISTORY — DX: Essential (primary) hypertension: I10

## 2017-12-03 LAB — COMPREHENSIVE METABOLIC PANEL
ALT: 25 U/L (ref 0–53)
AST: 23 U/L (ref 0–37)
Albumin: 4.2 g/dL (ref 3.5–5.2)
Alkaline Phosphatase: 41 U/L (ref 39–117)
BUN: 10 mg/dL (ref 6–23)
CO2: 31 meq/L (ref 19–32)
CREATININE: 0.94 mg/dL (ref 0.40–1.50)
Calcium: 9.4 mg/dL (ref 8.4–10.5)
Chloride: 102 mEq/L (ref 96–112)
GFR: 87.4 mL/min (ref >60.00)
GLUCOSE: 100 mg/dL — AB (ref 70–99)
Potassium: 4.1 mEq/L (ref 3.5–5.1)
SODIUM: 140 meq/L (ref 135–145)
Total Bilirubin: 0.7 mg/dL (ref 0.2–1.2)
Total Protein: 7.1 g/dL (ref 6.0–8.3)

## 2017-12-03 LAB — CBC
HCT: 48 % (ref 39.0–52.0)
Hemoglobin: 16.3 g/dL (ref 13.0–17.0)
MCHC: 34 g/dL (ref 30.0–36.0)
MCV: 96.6 fl (ref 78.0–100.0)
Platelets: 155 10*3/uL (ref 150.0–400.0)
RBC: 4.96 Mil/uL (ref 4.22–5.81)
RDW: 12.9 % (ref 11.5–15.5)
WBC: 4.3 10*3/uL (ref 4.0–10.5)

## 2017-12-03 LAB — TSH: TSH: 1.91 u[IU]/mL (ref 0.35–4.50)

## 2017-12-03 MED ORDER — AMLODIPINE BESYLATE 5 MG PO TABS: 5.0000 mg | ORAL_TABLET | Freq: Every day | ORAL | 3 refills | Status: DC

## 2017-12-03 NOTE — Assessment & Plan Note (Signed)
Blood pressure has been elevated at home as well as elevated today in the office.  We will check lab work as outlined below.  Once that returns we will place him on medication for his blood pressure.

## 2017-12-03 NOTE — Progress Notes (Signed)
Tommi Rumps, MD Phone: [phone]  Johnny Bradley is a 57 y.o. male who presents today for same-day visit.  Patient reports for about the last week or so he has had a lightheaded sensation that will come over him where he will feel light headed and then feels off balance as though he is going to fall over.  He has had no presyncope.  No syncopal episodes.  He denies vertiginous symptoms.  Typically occurs when he is standing.  Sometimes occurs shortly after he is going to stand up though other times he has been standing for period of time.  Does not occur when he is walking.  He does note some throbbing in his head just prior to feeling lightheaded though otherwise has had no headaches.  He has had no vision changes, numbness, or weakness.  No history of blood pressure issues.  He notes no increased thirst or urination.  He did have diarrhea a couple of weekends ago though that resolved.  He had no blood in his stool or abdominal pain.  No chest pain or shortness of breath.  No palpitations.  Typically his blood pressure has been well controlled.  He did somewhat recently go back on caffeinated beverages. BP at home has been in the 130s/90s.  He does note he just learned of a friend who committed suicide.  That has caused some distress in him though that is only today.  He has not had any anxiety prior to today.  Social History   Tobacco Use  Smoking Status Never Smoker  Smokeless Tobacco Never Used     ROS see history of present illness  Objective  Physical Exam Vitals:   12/03/17 1127 12/03/17 1128  BP:    Pulse:    Resp:    Temp:    SpO2: 97% 97%    BP Readings from Last 3 Encounters:  12/03/17 (!) 150/98  06/12/17 121/81  05/25/17 112/80   Wt Readings from Last 3 Encounters:  12/03/17 253 lb 6 oz (114.9 kg)  06/12/17 250 lb (113.4 kg)  05/25/17 257 lb 12.8 oz (116.9 kg)    Physical Exam  Constitutional: No distress.  Cardiovascular: Normal rate, regular rhythm  and normal heart sounds.  Pulmonary/Chest: Effort normal and breath sounds normal.  Abdominal: Soft. Bowel sounds are normal. He exhibits no distension. There is no tenderness.  Musculoskeletal: He exhibits no edema.  Neurological: He is alert.  CN 2-12 intact, 5/5 strength in bilateral biceps, triceps, grip, quads, hamstrings, plantar and dorsiflexion, sensation to light touch intact in bilateral UE and LE, normal gait, negative Romberg, no pronator drift, normal finger to nose, normal rapid alternating movements  Skin: Skin is warm and dry. He is not diaphoretic.  Bilateral TMs obscured by cerumen, irrigated by CMA with improvement in symptoms with his ears which were hurting to some degree, normal TM on the right, left TM still obscured by a small amount of cerumen though improved from prior   Assessment/Plan: Please see individual problem list.  Light-headed feeling Patient with intermittent symptoms of lightheadedness and then feeling off balance while standing.  He is neurologically intact.  His orthostatics are negative.  Benign neurological exam.  Blood pressure is slightly elevated higher than typical and has been recently.  We will proceed with lab work.  We will treat his blood pressure.  He will follow-up in a week for recheck.  Hypertension Blood pressure has been elevated at home as well as elevated today in the office.  We will check lab work as outlined below.  Once that returns we will place him on medication for his blood pressure.  Cerumen impaction Patient with some discomfort in bilateral ears that did improve after irrigation.  Discussed using Debrox to get the rest of the wax out of his left ear.  If this is not beneficial we could have him see ENT for evaluation.  Orders Placed This Encounter  Procedures  . CBC  . Comp Met (CMET)  . TSH    No orders of the defined types were placed in this encounter.    Tommi Rumps, MD [place]

## 2017-12-03 NOTE — Assessment & Plan Note (Signed)
Patient with some discomfort in bilateral ears that did improve after irrigation.  Discussed using Debrox to get the rest of the wax out of his left ear.  If this is not beneficial we could have him see ENT for evaluation.

## 2017-12-03 NOTE — Patient Instructions (Signed)
Nice to see you. We will check some lab work today and contact you with the results.  We will have you return in about a week to recheck your blood pressure and see how you are doing. If you develop palpitations, chest pain, shortness of breath, feel that you are going to pass out, do pass out, develop headaches, numbness, weakness, vision changes, or any new or changing symptoms please seek medical attention immediately.

## 2017-12-03 NOTE — Assessment & Plan Note (Addendum)
Patient with intermittent symptoms of lightheadedness and then feeling off balance while standing.  He is neurologically intact.  His orthostatics are negative.  Benign neurological exam.  Blood pressure is slightly elevated higher than typical and has been recently.  We will proceed with lab work.  We will treat his blood pressure.  He will follow-up in a week for recheck.

## 2017-12-03 NOTE — Progress Notes (Signed)
Patient came into office with complaint of dizziness when standing , with head pounding or throbbing sensation. Happen for the last two weeks periodically, happens more with standing for period of time. Patient came in due to happening twice this morning. BP when patient first arrived 168/104 pulse 68, gave patient 10  To 12 minutes to rest BP attained in left arm 158/100 advised BP of findings patient added to schedule. Rechecked BP 15 minutes later 150/98 pulse 70.

## 2017-12-10 ENCOUNTER — Encounter: Payer: Self-pay | Admitting: Family Medicine

## 2017-12-10 ENCOUNTER — Ambulatory Visit (INDEPENDENT_AMBULATORY_CARE_PROVIDER_SITE_OTHER): Payer: 59 | Admitting: Family Medicine

## 2017-12-10 ENCOUNTER — Other Ambulatory Visit: Payer: Self-pay

## 2017-12-10 DIAGNOSIS — I10 Essential (primary) hypertension: Secondary | ICD-10-CM

## 2017-12-10 DIAGNOSIS — Z6836 Body mass index (BMI) 36.0-36.9, adult: Secondary | ICD-10-CM | POA: Diagnosis not present

## 2017-12-10 DIAGNOSIS — E6609 Other obesity due to excess calories: Secondary | ICD-10-CM

## 2017-12-10 NOTE — Patient Instructions (Signed)
Nice to see you. Please continue to monitor your blood pressure.  Please work on diet and exercise as we discussed. If you develop lightheadedness or any additional symptoms please let us know.

## 2017-12-11 NOTE — Progress Notes (Signed)
  Tommi Rumps, MD Phone: [phone]  Johnny Bradley is a 57 y.o. male who presents today for f/u.  HYPERTENSION  Disease Monitoring  Home BP Monitoring 117/70s  Chest pain- no    Dyspnea- no Medications  Compliance-  Taking amlodipine. Lightheadedness-  Has resolved  Edema- no He has improved since going on the amlodipine.  Occasionally he will feel a wave sensation, though this lasts briefly and is much less frequent.  His lightheadedness has resolved since last week.  He did try coming off of the Aleve to see if that was affecting his blood pressure though he started back on it as his knees started to bother him.  He is going to work on getting back to exercising and watching what he eats.    Social History   Tobacco Use  Smoking Status Never Smoker  Smokeless Tobacco Never Used     ROS see history of present illness  Objective  Physical Exam Vitals:   12/10/17 1523  BP: 130/90  Pulse: 75  Temp: 97.9 F (36.6 C)  SpO2: 97%    BP Readings from Last 3 Encounters:  12/10/17 130/90  12/03/17 (!) 150/98  06/12/17 121/81   Wt Readings from Last 3 Encounters:  12/10/17 256 lb 12.8 oz (116.5 kg)  12/03/17 253 lb 6 oz (114.9 kg)  06/12/17 250 lb (113.4 kg)    Physical Exam  Constitutional: No distress.  Cardiovascular: Normal rate, regular rhythm and normal heart sounds.  Pulmonary/Chest: Effort normal and breath sounds normal.  Musculoskeletal: He exhibits no edema.  Neurological: He is alert.  Skin: Skin is warm and dry. He is not diaphoretic.     Assessment/Plan: Please see individual problem list.  Hypertension Prior symptoms possibly related to elevated blood pressure.  BP much improved at home.  He will continue amlodipine.  He will monitor for recurrence of his prior symptoms.  Class 2 obesity due to excess calories without serious comorbidity with body mass index (BMI) of 36.0 to 36.9 in adult Urged diet and exercise.   No orders of the defined  types were placed in this encounter.   No orders of the defined types were placed in this encounter.    Tommi Rumps, MD [place]

## 2017-12-11 NOTE — Assessment & Plan Note (Addendum)
Prior symptoms possibly related to elevated blood pressure.  BP much improved at home.  He will continue amlodipine.  He will monitor for recurrence of his prior symptoms.

## 2017-12-11 NOTE — Assessment & Plan Note (Signed)
Urged diet and exercise.

## 2018-03-11 ENCOUNTER — Ambulatory Visit: Payer: 59 | Admitting: Family Medicine

## 2018-03-11 ENCOUNTER — Encounter: Payer: Self-pay | Admitting: Family Medicine

## 2018-03-11 VITALS — BP 128/88 | HR 76 | Temp 98.0°F | Ht 70.0 in | Wt 251.4 lb

## 2018-03-11 DIAGNOSIS — I10 Essential (primary) hypertension: Secondary | ICD-10-CM

## 2018-03-11 DIAGNOSIS — Z6836 Body mass index (BMI) 36.0-36.9, adult: Secondary | ICD-10-CM

## 2018-03-11 DIAGNOSIS — E6609 Other obesity due to excess calories: Secondary | ICD-10-CM

## 2018-03-11 DIAGNOSIS — R0789 Other chest pain: Secondary | ICD-10-CM | POA: Diagnosis not present

## 2018-03-11 LAB — COMPREHENSIVE METABOLIC PANEL
ALBUMIN: 4.3 g/dL (ref 3.5–5.2)
ALT: 25 U/L (ref 0–53)
AST: 19 U/L (ref 0–37)
Alkaline Phosphatase: 43 U/L (ref 39–117)
BUN: 17 mg/dL (ref 6–23)
CALCIUM: 9.3 mg/dL (ref 8.4–10.5)
CO2: 30 mEq/L (ref 19–32)
Chloride: 100 mEq/L (ref 96–112)
Creatinine, Ser: 0.93 mg/dL (ref 0.40–1.50)
GFR: 88.41 mL/min (ref >60.00)
Glucose, Bld: 126 mg/dL — ABNORMAL HIGH (ref 70–99)
Potassium: 3.8 mEq/L (ref 3.5–5.1)
SODIUM: 137 meq/L (ref 135–145)
TOTAL PROTEIN: 7.3 g/dL (ref 6.0–8.3)
Total Bilirubin: 0.7 mg/dL (ref 0.2–1.2)

## 2018-03-11 LAB — LIPID PANEL
CHOLESTEROL: 212 mg/dL — AB (ref 0–200)
HDL: 46.2 mg/dL (ref >39.00)
LDL Cholesterol: 138 mg/dL — ABNORMAL HIGH (ref 0–99)
NonHDL: 165.81
TRIGLYCERIDES: 138 mg/dL (ref 0.0–149.0)
Total CHOL/HDL Ratio: 5
VLDL: 27.6 mg/dL (ref 0.0–40.0)

## 2018-03-11 LAB — HEMOGLOBIN A1C: HEMOGLOBIN A1C: 5.9 % (ref 4.6–6.5)

## 2018-03-11 NOTE — Assessment & Plan Note (Signed)
Patient will continue to monitor his diet.  Check lab work today.

## 2018-03-11 NOTE — Patient Instructions (Addendum)
Nice to see you. Please pick up a refill of your amlodipine. We will get lab work today and contact you with the results. If you have persistent chest discomfort or you develop shortness of breath please be evaluated immediately.

## 2018-03-11 NOTE — Progress Notes (Signed)
  Tommi Rumps, MD Phone: [phone]  Johnny Bradley is a 57 y.o. male who presents today for f/u.  CC: Hypertension, obesity, right-sided chest soreness next  Hypertension: Typically in the mid 120s over low 80s.  He has not consistently been taking his amlodipine as he did not realize he had refills on it.  He was trying to save the medication.  Notes no chest pressure or shortness of breath,.  Patient reports right-sided chest soreness that feels like he just lifted something heavy like doing push-ups.  Possibly occurs next day after he has done some heavy lifting or push-ups. he has noted no tenderness.  No exertional symptoms.  He will oftentimes notice it after he wakes up or while he is at rest.  No chest pressure.  No shortness of breath.  No radiation.  No diaphoresis.  No cardiac history.  Has been doing juices in the morning and later in the day.  He will eat out at lunch.  Does eat healthy at home for dinner.  Not able to exercise much related to his chronic knee discomfort.   Social History   Tobacco Use  Smoking Status Never Smoker  Smokeless Tobacco Never Used     ROS see history of present illness  Objective  Physical Exam Vitals:   03/11/18 1321 03/11/18 1344  BP: 130/90 128/88  Pulse: 76   Temp: 98 F (36.7 C)   SpO2: 96%     BP Readings from Last 3 Encounters:  03/11/18 128/88  12/10/17 130/90  12/03/17 (!) 150/98   Wt Readings from Last 3 Encounters:  03/11/18 251 lb 6.4 oz (114 kg)  12/10/17 256 lb 12.8 oz (116.5 kg)  12/03/17 253 lb 6 oz (114.9 kg)    Physical Exam  Constitutional: No distress.  Cardiovascular: Normal rate, regular rhythm and normal heart sounds.  Pulmonary/Chest: Effort normal and breath sounds normal. He exhibits no tenderness.  Musculoskeletal: He exhibits no edema.  Neurological: He is alert.  Skin: Skin is warm and dry. He is not diaphoretic.   EKG: Normal sinus rhythm, rate 72, nonspecific QRS widening and  nonspecific T wave abnormality  Assessment/Plan: Please see individual problem list.  Hypertension Relatively well controlled on amlodipine when he was taking it.  I have encouraged him to pick up a refill from the pharmacy.  Follow-up in 3 months.  Atypical chest pain I suspect this is musculoskeletal given his description.  Symptoms are atypical.  He has no cardiac history.  He does have risk factors with hypertension, elevated LDL cholesterol, and obesity.  He reports his father had a pacemaker  and possibly had an MI in his 2s.  No other family history of cardiac disease.  I will forward his EKG to cardiology to review and get their input to determine if coronary calcification scoring would be reasonable.  Class 2 obesity due to excess calories without serious comorbidity with body mass index (BMI) of 36.0 to 36.9 in adult Patient will continue to monitor his diet.  Check lab work today.   Orders Placed This Encounter  Procedures  . Comp Met (CMET)  . Lipid panel  . HgB A1c  . EKG 12-Lead    No orders of the defined types were placed in this encounter.    Tommi Rumps, MD [place]

## 2018-03-11 NOTE — Assessment & Plan Note (Signed)
Relatively well controlled on amlodipine when he was taking it.  I have encouraged him to pick up a refill from the pharmacy.  Follow-up in 3 months.

## 2018-03-11 NOTE — Assessment & Plan Note (Addendum)
I suspect this is musculoskeletal given his description.  Symptoms are atypical.  He has no cardiac history.  He does have risk factors with hypertension, elevated LDL cholesterol, and obesity.  He reports his father had a pacemaker  and possibly had an MI in his 51s.  No other family history of cardiac disease.  I will forward his EKG to cardiology to review and get their input to determine if coronary calcification scoring would be reasonable.

## 2018-03-12 ENCOUNTER — Telehealth: Payer: Self-pay | Admitting: Family Medicine

## 2018-03-12 DIAGNOSIS — E785 Hyperlipidemia, unspecified: Secondary | ICD-10-CM

## 2018-03-12 NOTE — Telephone Encounter (Signed)
Copied from [place] [id number]. Topic: Quick Communication - See Telephone Encounter >> Mar 12, 2018  5:50 PM Rutherford Nail, NT wrote: CRM for notification. See Telephone encounter for: 03/12/18. Patient calling and states that the pharmacy does not have any prescriptions for him to pick up. Patient states that it is a medication for his cholesterol. Patient is going out of town tomorrow (03/13/18) morning and is worried about taking this medication. Please advise. [id number] - [address], [place] - [place]

## 2018-03-13 MED ORDER — ROSUVASTATIN CALCIUM 20 MG PO TABS
20.0000 mg | ORAL_TABLET | Freq: Every day | ORAL | 3 refills | Status: DC
Start: 2018-03-13 — End: 2018-10-02

## 2018-03-13 NOTE — Telephone Encounter (Signed)
Was Dr. Caryl Bis going to send in a rx for Crestor for this patient?

## 2018-03-13 NOTE — Telephone Encounter (Signed)
I have sent this to his pharmacy. Order placed for labs.

## 2018-03-13 NOTE — Telephone Encounter (Signed)
Please advise 

## 2018-03-20 ENCOUNTER — Other Ambulatory Visit: Payer: Self-pay | Admitting: Family Medicine

## 2018-03-20 DIAGNOSIS — R9431 Abnormal electrocardiogram [ECG] [EKG]: Secondary | ICD-10-CM

## 2018-03-28 ENCOUNTER — Encounter: Payer: Self-pay | Admitting: Family Medicine

## 2018-03-28 ENCOUNTER — Other Ambulatory Visit: Payer: Self-pay | Admitting: Internal Medicine

## 2018-04-04 ENCOUNTER — Ambulatory Visit
Admission: RE | Admit: 2018-04-04 | Discharge: 2018-04-04 | Disposition: A | Discharge status: Home / Self Care | Payer: 59 | Source: Ambulatory Visit | Attending: Family Medicine | Admitting: Family Medicine

## 2018-04-04 DIAGNOSIS — I081 Rheumatic disorders of both mitral and tricuspid valves: Secondary | ICD-10-CM | POA: Insufficient documentation

## 2018-04-04 DIAGNOSIS — R9431 Abnormal electrocardiogram [ECG] [EKG]: Secondary | ICD-10-CM | POA: Diagnosis not present

## 2018-04-04 NOTE — Progress Notes (Signed)
*  PRELIMINARY RESULTS* Echocardiogram 2D Echocardiogram has been performed.  Johnny Bradley [place] 04/04/2018, 10:55 AM

## 2018-04-12 ENCOUNTER — Telehealth: Payer: Self-pay

## 2018-04-12 ENCOUNTER — Other Ambulatory Visit: Payer: Self-pay | Admitting: Family Medicine

## 2018-04-12 DIAGNOSIS — R079 Chest pain, unspecified: Secondary | ICD-10-CM

## 2018-04-12 DIAGNOSIS — R9431 Abnormal electrocardiogram [ECG] [EKG]: Secondary | ICD-10-CM

## 2018-04-12 NOTE — Telephone Encounter (Signed)
Copied from [place] #[id number]. Topic: General - Other >> Apr 12, 2018 11:06 AM Yvette Rack wrote: Reason for CRM: Pt returned call to office. Informed pt that he was referred to to cardiology. Pt acknowledged understanding but questioned why he was being referred. Pt requests a call back. Cb# [phone]

## 2018-04-16 ENCOUNTER — Other Ambulatory Visit (INDEPENDENT_AMBULATORY_CARE_PROVIDER_SITE_OTHER): Payer: 59

## 2018-04-16 DIAGNOSIS — E785 Hyperlipidemia, unspecified: Secondary | ICD-10-CM

## 2018-04-16 LAB — HEPATIC FUNCTION PANEL
ALBUMIN: 4.2 g/dL (ref 3.5–5.2)
ALT: 40 U/L (ref 0–53)
AST: 26 U/L (ref 0–37)
Alkaline Phosphatase: 47 U/L (ref 39–117)
BILIRUBIN TOTAL: 1.1 mg/dL (ref 0.2–1.2)
Bilirubin, Direct: 0.2 mg/dL (ref 0.0–0.3)
Total Protein: 6.6 g/dL (ref 6.0–8.3)

## 2018-04-16 LAB — LDL CHOLESTEROL, DIRECT: LDL DIRECT: 86 mg/dL

## 2018-05-03 ENCOUNTER — Encounter: Payer: Self-pay | Admitting: Cardiovascular Disease

## 2018-05-03 ENCOUNTER — Ambulatory Visit: Payer: 59 | Admitting: Cardiovascular Disease

## 2018-05-03 VITALS — BP 118/82 | HR 74 | Ht 70.0 in | Wt 251.2 lb

## 2018-05-03 DIAGNOSIS — R0789 Other chest pain: Secondary | ICD-10-CM | POA: Diagnosis not present

## 2018-05-03 DIAGNOSIS — E785 Hyperlipidemia, unspecified: Secondary | ICD-10-CM

## 2018-05-03 NOTE — Patient Instructions (Addendum)
Testing/Procedures: Your physician has requested that you have an exercise tolerance test. For further information please visit [email or web address]. Please also follow instruction sheet, as given.   Do not drink or eat foods with caffeine for 24 hours before the test. (Chocolate, coffee, tea, or energy drinks)  If you use an inhaler, bring it with you to the test.  Do not smoke for 4 hours before the test.  Wear comfortable shoes and clothing.  Follow-Up: Your physician recommends that you schedule a follow-up appointment as needed.    It was a pleasure seeing you today here in the office. Please do not hesitate to give [place] a call back if you have any further questions. [place], BSN    Exercise Stress Electrocardiogram An exercise stress electrocardiogram is a test to check how blood flows to your heart. It is done to find areas of poor blood flow. You will need to walk on a treadmill for this test. The electrocardiogram will record your heartbeat when you are at rest and when you are exercising. What happens before the procedure?  Do not have drinks with caffeine or foods with caffeine for 24 hours before the test, or as told by your doctor. This includes coffee, tea (even decaf tea), sodas, chocolate, and cocoa.  Follow your doctor's instructions about eating and drinking before the test.  Ask your doctor what medicines you should or should not take before the test. Take your medicines with water unless told by your doctor not to.  If you use an inhaler, bring it with you to the test.  Bring a snack to eat after the test.  Do not  smoke for 4 hours before the test.  Do not put lotions, powders, creams, or oils on your chest before the test.  Wear comfortable shoes and clothing. What happens during the procedure?  You will have patches put on your chest. Small areas of your chest may need to be shaved. Wires will be connected to the patches.  Your heart  rate will be watched while you are resting and while you are exercising.  You will walk on the treadmill. The treadmill will slowly get faster to raise your heart rate.  The test will take about 1-2 hours. What happens after the procedure?  Your heart rate and blood pressure will be watched after the test.  You may return to your normal diet, activities, and medicines or as told by your doctor. This information is not intended to replace advice given to you by your health care provider. Make sure you discuss any questions you have with your health care provider. Document Released: 01/24/2008 Document Revised: 04/05/2016 Document Reviewed: 04/14/2013 Elsevier Interactive Patient Education  [employer].

## 2018-05-03 NOTE — Progress Notes (Signed)
Cardiology Office Note   Date:  05/03/2018   ID:  Johnny Bradley, DOB Jun 16, 1961, MRN [id number]  PCP:  [name], MD  Cardiologist:   Kathlyn Sacramento, MD   Chief Complaint  Patient presents with  . other    Ref by Dr. Caryl Bis for abnormal EKG & Echo with pt. having chest discomfort. Meds reviewed by the pt. verbally. Pt. c/o chest discomfort, lightheaded and shortness of breath when climbing stairs.       History of Present Illness: Johnny Bradley is a 57 y.o. male who was referred by Dr. Caryl Bis for evaluation of chest pain and an abnormal EKG.  The patient has no previous cardiac history.  He has known history of recently diagnosed hyperlipidemia and hypertension both are being treated.  There is no history of tobacco use.  He does consume excessive amount of alcohol on the weekends and typically he drinks 12-16 beers on the weekend but very little on weekdays. The patient recently had an EKG done which showed borderline QRS prolongation.  He did complain of atypical chest pain and thus he was referred for an echocardiogram which was personally reviewed by me.  It showed normal LV systolic function with an EF of 55% with trace mitral and tricuspid regurgitation with no evidence of pulmonary hypertension. He reports chest discomfort across both sides of the chest.  Its described as soreness as if he pulled a muscle although he is not aware of any muscular injuries.  Sometimes he wakes up with it and his chest is sore for a day or 2 and then the symptoms are gone for a while.  There is no pattern for the symptoms with exercise.  He is able to cut his grass without any exertional symptoms.  He does describe exertional dyspnea going up 2 flights of stairs.  He denies any palpitations or syncope. Family history is negative for premature coronary artery disease.  His father had a pacemaker done in his 70s and died suddenly in his sleep in his early 57s.    Past Medical History:   Diagnosis Date  . Back pain   . Hypertension   . Shoulder pain    Left    Past Surgical History:  Procedure Laterality Date  . KNEE ARTHROSCOPY Right    MCL Torn     Current Outpatient Medications  Medication Sig Dispense Refill  . amLODipine (NORVASC) 5 MG tablet Take 1 tablet (5 mg total) by mouth daily. 90 tablet 3  . aspirin 81 MG tablet Take 81 mg by mouth daily.    . Cholecalciferol (VITAMIN D) 2000 units CAPS Take by mouth.    . Multiple Vitamins-Minerals (CENTRUM SILVER ADULT 50+ PO) Take 1 tablet by mouth daily.    . Naproxen Sodium (ALEVE PO) Take 2 tablets by mouth as needed.     . Nutritional Supplements (JUICE PLUS FIBRE PO) Take by mouth.    . rosuvastatin (CRESTOR) 20 MG tablet Take 1 tablet (20 mg total) by mouth daily. 90 tablet 3   No current facility-administered medications for this visit.     Allergies:   Patient has no known allergies.    Social History:  The patient  reports that he has never smoked. He has never used smokeless tobacco. He reports that he drinks about 12.0 standard drinks of alcohol per week. He reports that he does not use drugs.   Family History:  The patient's family history includes Heart disease in his father;  Stroke in his paternal grandmother.    ROS:  Please see the history of present illness.   Otherwise, review of systems are positive for none.   All other systems are reviewed and negative.    PHYSICAL EXAM: VS:  BP 118/82 (BP Location: Right Arm, Patient Position: Sitting, Cuff Size: Normal)   Pulse 74   Ht 5\' 10"  (1.778 m)   Wt 251 lb 4 oz (114 kg)   BMI 36.05 kg/m  , BMI Body mass index is 36.05 kg/m. GEN: Well nourished, well developed, in no acute distress  HEENT: normal  Neck: no JVD, carotid bruits, or masses Cardiac: RRR; no murmurs, rubs, or gallops,no edema  Respiratory:  clear to auscultation bilaterally, normal work of breathing GI: soft, nontender, nondistended, + BS MS: no deformity or atrophy    Skin: warm and dry, no rash Neuro:  Strength and sensation are intact Psych: euthymic mood, full affect   EKG:  EKG is ordered today. The ekg ordered today demonstrates normal sinus rhythm with borderline LVH with mild QRS widening.   Recent Labs: 12/03/2017: Hemoglobin 16.3; Platelets 155.0; TSH 1.91 03/11/2018: BUN 17; Creatinine, Ser 0.93; Potassium 3.8; Sodium 137 04/16/2018: ALT 40    Lipid Panel    Component Value Date/Time   CHOL 212 (H) 03/11/2018 1350   TRIG 138.0 03/11/2018 1350   HDL 46.20 03/11/2018 1350   CHOLHDL 5 03/11/2018 1350   VLDL 27.6 03/11/2018 1350   LDLCALC 138 (H) 03/11/2018 1350   LDLDIRECT 86.0 04/16/2018 0917      Wt Readings from Last 3 Encounters:  05/03/18 251 lb 4 oz (114 kg)  03/11/18 251 lb 6.4 oz (114 kg)  12/10/17 256 lb 12.8 oz (116.5 kg)      Other studies Reviewed: Additional studies/ records that were reviewed today include: Recent echocardiogram and EKG. Review of the above records demonstrates: Summarized above  PAD Screen 05/03/2018  Previous PAD dx? No  Previous surgical procedure? No  Pain with walking? No  Feet/toe relief with dangling? No  Painful, non-healing ulcers? No  Extremities discolored? No      ASSESSMENT AND PLAN:  1.  Atypical chest pain: The patient's chest pain is very atypical and likely musculoskeletal.  However, given his risk factors for coronary artery disease, I requested a treadmill stress test.  Continue treatment of risk factors.  2.  Mildly abnormal EKG: Borderline LVH with QRS widening likely reflects some degree of hypertensive heart disease.  Continue treatment with amlodipine.  Recent echocardiogram was reassuring.  3.  Hyperlipidemia: Currently on rosuvastatin 20 mg daily.  He should consider decreasing alcohol intake.    Disposition:   FU with me as needed.   Signed,  Kathlyn Sacramento, MD  05/03/2018 9:33 AM    [place]

## 2018-05-10 ENCOUNTER — Telehealth: Payer: Self-pay | Admitting: *Deleted

## 2018-05-10 NOTE — Telephone Encounter (Signed)
Patient verbalized understanding of the following:  DO NOT smoke for 4 hours before your test.  If you use an inhaler, bring it with you to the test.  Wear comfortable shoes and clothing.  He is also aware of appointment date and time.

## 2018-05-13 ENCOUNTER — Ambulatory Visit (INDEPENDENT_AMBULATORY_CARE_PROVIDER_SITE_OTHER): Payer: 59

## 2018-05-13 DIAGNOSIS — R0789 Other chest pain: Secondary | ICD-10-CM | POA: Diagnosis not present

## 2018-05-13 LAB — EXERCISE TOLERANCE TEST
CHL RATE OF PERCEIVED EXERTION: 16
CSEPEDS: 53 s
CSEPHR: 85 %
Estimated workload: 10.1 METS
Exercise duration (min): 8 min
MPHR: 161 {beats}/min
Peak HR: 137 {beats}/min
Rest HR: 75 {beats}/min

## 2018-05-28 ENCOUNTER — Encounter: Payer: 59 | Admitting: Family Medicine

## 2018-05-29 ENCOUNTER — Encounter: Payer: Self-pay | Admitting: Family Medicine

## 2018-05-29 ENCOUNTER — Ambulatory Visit (INDEPENDENT_AMBULATORY_CARE_PROVIDER_SITE_OTHER): Payer: 59 | Admitting: Family Medicine

## 2018-05-29 VITALS — BP 132/88 | HR 79 | Temp 98.0°F | Ht 70.0 in | Wt 252.8 lb

## 2018-05-29 DIAGNOSIS — Z0001 Encounter for general adult medical examination with abnormal findings: Secondary | ICD-10-CM | POA: Diagnosis not present

## 2018-05-29 DIAGNOSIS — Z125 Encounter for screening for malignant neoplasm of prostate: Secondary | ICD-10-CM | POA: Diagnosis not present

## 2018-05-29 DIAGNOSIS — H6123 Impacted cerumen, bilateral: Secondary | ICD-10-CM

## 2018-05-29 DIAGNOSIS — L989 Disorder of the skin and subcutaneous tissue, unspecified: Secondary | ICD-10-CM | POA: Diagnosis not present

## 2018-05-29 DIAGNOSIS — Z23 Encounter for immunization: Secondary | ICD-10-CM

## 2018-05-29 DIAGNOSIS — E782 Mixed hyperlipidemia: Secondary | ICD-10-CM | POA: Diagnosis not present

## 2018-05-29 LAB — LIPID PANEL
Cholesterol: 153 mg/dL (ref 0–200)
HDL: 45 mg/dL (ref >39.00)
NonHDL: 108.28
Total CHOL/HDL Ratio: 3
Triglycerides: 208 mg/dL — ABNORMAL HIGH (ref 0.0–149.0)
VLDL: 41.6 mg/dL — AB (ref 0.0–40.0)

## 2018-05-29 LAB — COMPREHENSIVE METABOLIC PANEL
ALBUMIN: 4.5 g/dL (ref 3.5–5.2)
ALK PHOS: 53 U/L (ref 39–117)
ALT: 40 U/L (ref 0–53)
AST: 26 U/L (ref 0–37)
BILIRUBIN TOTAL: 0.6 mg/dL (ref 0.2–1.2)
BUN: 17 mg/dL (ref 6–23)
CALCIUM: 9.7 mg/dL (ref 8.4–10.5)
CO2: 32 meq/L (ref 19–32)
Chloride: 100 mEq/L (ref 96–112)
Creatinine, Ser: 1.04 mg/dL (ref 0.40–1.50)
GFR: 77.65 mL/min (ref >60.00)
Glucose, Bld: 123 mg/dL — ABNORMAL HIGH (ref 70–99)
Potassium: 4 mEq/L (ref 3.5–5.1)
Sodium: 138 mEq/L (ref 135–145)
Total Protein: 7.2 g/dL (ref 6.0–8.3)

## 2018-05-29 LAB — PSA: PSA: 1.15 ng/mL (ref 0.10–4.00)

## 2018-05-29 LAB — LDL CHOLESTEROL, DIRECT: Direct LDL: 84 mg/dL

## 2018-05-29 NOTE — Progress Notes (Signed)
Tommi Rumps, MD Phone: [phone]  Johnny Bradley is a 57 y.o. male who presents today for cpe.  Exercise: much more active. Diet: Notes this is improved.  He is taking his lunch.  Decrease cookies and chips. Colonoscopy 5 to 6 years ago with no abnormalities per his report. 10-year recall.  No blood in stool. No family history of prostate cancer.  He does report a PSA abnormality in the past. Up-to-date on tetanus vaccination, HIV screening, and hepatitis C screening.  Flu shot to be given today.  Discussed Shingrix and he will check on insurance coverage. No tobacco use or illicit drug use.  He drinks low alcohol content beer on the weekends.  Does not drink to the point that he gets drunk. Sees a dentist often.  He is going to see an ophthalmologist soon. He reports a sore spot on the back of his neck on the left side and on his left cheek that seem to come and go to some degree.  They will heal up and then become irritated again.  He has never seen a dermatologist for them.  Active Ambulatory Problems    Diagnosis Date Noted  . Bilateral low back pain without sciatica 12/19/2014  . Encounter for general adult medical examination with abnormal findings 05/25/2017  . Bunion 05/25/2017  . Class 2 obesity due to excess calories without serious comorbidity with body mass index (BMI) of 36.0 to 36.9 in adult 05/25/2017  . Tremor 05/25/2017  . Light-headed feeling 12/03/2017  . Hypertension 12/03/2017  . Cerumen impaction 12/03/2017  . Atypical chest pain 03/11/2018  . Skin lesion 05/29/2018   Resolved Ambulatory Problems    Diagnosis Date Noted  . Left shoulder pain 12/19/2014  . Right knee pain 12/19/2014  . Routine general medical examination at a health care facility 12/19/2014  . Encounter to establish care 12/19/2014  . Chill 05/25/2017  . Prostatitis 05/25/2017   Past Medical History:  Diagnosis Date  . Back pain   . Shoulder pain     Family History  Problem  Relation Age of Onset  . Heart disease Father   . Stroke Paternal Grandmother     Social History   Socioeconomic History  . Marital status: Married    Spouse name: Not on file  . Number of children: Not on file  . Years of education: Not on file  . Highest education level: Not on file  Occupational History  . Not on file  Social Needs  . Financial resource strain: Not on file  . Food insecurity:    Worry: Not on file    Inability: Not on file  . Transportation needs:    Medical: Not on file    Non-medical: Not on file  Tobacco Use  . Smoking status: Never Smoker  . Smokeless tobacco: Never Used  Substance and Sexual Activity  . Alcohol use: Yes    Alcohol/week: 12.0 standard drinks    Types: 12 Cans of beer per week    Comment: On weekends- [employer] [address]   . Drug use: No  . Sexual activity: Yes    Partners: Female    Comment: Wife  Lifestyle  . Physical activity:    Days per week: Not on file    Minutes per session: Not on file  . Stress: Not on file  Relationships  . Social connections:    Talks on phone: Not on file    Gets together: Not on file    Attends religious  service: Not on file    Active member of club or organization: Not on file    Attends meetings of clubs or organizations: Not on file    Relationship status: Not on file  . Intimate partner violence:    Fear of current or ex partner: Not on file    Emotionally abused: Not on file    Physically abused: Not on file    Forced sexual activity: Not on file  Other Topics Concern  . Not on file  Social History Narrative   Computer Dept. Next door- manages paperwork    Lives at home with wife and daughter & fiance    Son who owns Medical sales representative. And has a son 8 yo    Granddaughter 8 mos from daughter    Pets: 1 cat 2 dogs    Right handed    Caffeine- none   Masters degree    Enjoys- outdoor activities, festivals     ROS  General:  Negative for nexplained weight loss, fever Skin: Negative for new  or changing mole, positive for sore that won't heal HEENT: Negative for trouble hearing, trouble seeing, ringing in ears, mouth sores, hoarseness, change in voice, dysphagia. CV:  Negative for chest pain, dyspnea, edema, palpitations Resp: Negative for cough, dyspnea, hemoptysis GI: Negative for nausea, vomiting, diarrhea, constipation, abdominal pain, melena, hematochezia. GU: Negative for dysuria, incontinence, urinary hesitance, hematuria, vaginal or penile discharge, polyuria, sexual difficulty, lumps in testicle or breasts MSK: Negative for muscle cramps or aches, joint pain or swelling Neuro: Negative for headaches, weakness, numbness, dizziness, passing out/fainting Psych: Negative for depression, anxiety, memory problems  Objective  Physical Exam Vitals:   05/29/18 1329  BP: 132/88  Pulse: 79  Temp: 98 F (36.7 C)  SpO2: 96%    BP Readings from Last 3 Encounters:  05/29/18 132/88  05/03/18 118/82  03/11/18 128/88   Wt Readings from Last 3 Encounters:  05/29/18 252 lb 12.8 oz (114.7 kg)  05/03/18 251 lb 4 oz (114 kg)  03/11/18 251 lb 6.4 oz (114 kg)    Physical Exam  Constitutional: No distress.  HENT:  Head: Normocephalic and atraumatic.    Mouth/Throat: Oropharynx is clear and moist.  Left TM obscured by cerumen, right TM normal, left ear canal irrigated by CMA.  Patient tolerated, minimal wax remained with normal TM  Eyes: Pupils are equal, round, and reactive to light. Conjunctivae are normal.  Neck: Neck supple.  Cardiovascular: Normal rate, regular rhythm and normal heart sounds.  Pulmonary/Chest: Effort normal and breath sounds normal.  Abdominal: Soft. Bowel sounds are normal. He exhibits no distension. There is no tenderness. There is no rebound and no guarding.  Musculoskeletal: He exhibits no edema.  Lymphadenopathy:    He has no cervical adenopathy.  Neurological: He is alert.  Skin: Skin is warm and dry. He is not diaphoretic.     Psychiatric:  He has a normal mood and affect.     Assessment/Plan:   Encounter for general adult medical examination with abnormal findings Physical exam completed.  He will work on diet and exercise.  Will request colonoscopy records.  Flu shot given today.  He will check with his insurance regarding Shingrix.  Lab work as outlined below  Skin lesion Nonhealing skin lesions.  We will have him see dermatology.  Referral placed.  Cerumen impaction Improved with irrigation.  Discussed Debrox.   Orders Placed This Encounter  Procedures  . Flu Vaccine QUAD 6+ mos PF IM (  Fluarix Quad PF)  . Comp Met (CMET)  . Lipid panel  . PSA  . LDL cholesterol, direct  . Ambulatory referral to Dermatology    Referral Priority:   Routine    Referral Type:   Consultation    Referral Reason:   Specialty Services Required    Requested Specialty:   Dermatology    Number of Visits Requested:   1    No orders of the defined types were placed in this encounter.    Tommi Rumps, MD [place]

## 2018-05-29 NOTE — Assessment & Plan Note (Signed)
Physical exam completed.  He will work on diet and exercise.  Will request colonoscopy records.  Flu shot given today.  He will check with his insurance regarding Shingrix.  Lab work as outlined below

## 2018-05-29 NOTE — Assessment & Plan Note (Signed)
Improved with irrigation.  Discussed Debrox.

## 2018-05-29 NOTE — Patient Instructions (Signed)
Nice to see you. Please work on diet and exercise. We will check lab work today and contact you with the results. We will get you to see dermatology.

## 2018-05-29 NOTE — Assessment & Plan Note (Signed)
Nonhealing skin lesions.  We will have him see dermatology.  Referral placed.

## 2018-06-06 ENCOUNTER — Encounter: Payer: Self-pay | Admitting: Family Medicine

## 2018-06-07 ENCOUNTER — Encounter

## 2018-06-07 ENCOUNTER — Ambulatory Visit: Payer: 59 | Admitting: Cardiovascular Disease

## 2018-06-21 ENCOUNTER — Ambulatory Visit: Payer: 59 | Admitting: Family Medicine

## 2018-10-02 ENCOUNTER — Encounter: Payer: Self-pay | Admitting: Family Medicine

## 2018-10-02 ENCOUNTER — Ambulatory Visit: Payer: BC Managed Care – PPO | Admitting: Family Medicine

## 2018-10-02 VITALS — BP 118/80 | HR 70 | Temp 97.9°F | Ht 70.0 in | Wt 245.8 lb

## 2018-10-02 DIAGNOSIS — E782 Mixed hyperlipidemia: Secondary | ICD-10-CM

## 2018-10-02 DIAGNOSIS — L989 Disorder of the skin and subcutaneous tissue, unspecified: Secondary | ICD-10-CM | POA: Diagnosis not present

## 2018-10-02 DIAGNOSIS — I10 Essential (primary) hypertension: Secondary | ICD-10-CM

## 2018-10-02 DIAGNOSIS — E785 Hyperlipidemia, unspecified: Secondary | ICD-10-CM | POA: Diagnosis not present

## 2018-10-02 DIAGNOSIS — R2231 Localized swelling, mass and lump, right upper limb: Secondary | ICD-10-CM | POA: Insufficient documentation

## 2018-10-02 LAB — COMPREHENSIVE METABOLIC PANEL
ALT: 32 U/L (ref 0–53)
AST: 30 U/L (ref 0–37)
Albumin: 4.3 g/dL (ref 3.5–5.2)
Alkaline Phosphatase: 50 U/L (ref 39–117)
BUN: 14 mg/dL (ref 6–23)
CALCIUM: 9.3 mg/dL (ref 8.4–10.5)
CHLORIDE: 102 meq/L (ref 96–112)
CO2: 32 meq/L (ref 19–32)
Creatinine, Ser: 0.94 mg/dL (ref 0.40–1.50)
GFR: 82 mL/min (ref >60.00)
Glucose, Bld: 109 mg/dL — ABNORMAL HIGH (ref 70–99)
Potassium: 4.4 mEq/L (ref 3.5–5.1)
Sodium: 140 mEq/L (ref 135–145)
Total Bilirubin: 0.9 mg/dL (ref 0.2–1.2)
Total Protein: 7 g/dL (ref 6.0–8.3)

## 2018-10-02 LAB — LIPID PANEL
Cholesterol: 152 mg/dL (ref 0–200)
HDL: 44.7 mg/dL (ref >39.00)
LDL CALC: 86 mg/dL (ref 0–99)
NonHDL: 106.8
TRIGLYCERIDES: 105 mg/dL (ref 0.0–149.0)
Total CHOL/HDL Ratio: 3
VLDL: 21 mg/dL (ref 0.0–40.0)

## 2018-10-02 MED ORDER — ROSUVASTATIN CALCIUM 20 MG PO TABS: 20.0000 mg | ORAL_TABLET | Freq: Every day | ORAL | 3 refills | Status: DC

## 2018-10-02 MED ORDER — AMLODIPINE BESYLATE 5 MG PO TABS: 5.0000 mg | ORAL_TABLET | Freq: Every day | ORAL | 3 refills | Status: DC

## 2018-10-02 NOTE — Assessment & Plan Note (Signed)
Has been evaluated by dermatology.

## 2018-10-02 NOTE — Assessment & Plan Note (Signed)
Potentially could be a cyst.  Does not seem to be attached to a tendon.  Discussed ice.  Discussed monitoring and if not resolving or if it becomes larger or painful letting us know and we can refer to orthopedics.

## 2018-10-02 NOTE — Assessment & Plan Note (Signed)
Check lipid panel.  Continue Crestor.

## 2018-10-02 NOTE — Progress Notes (Signed)
  Tommi Rumps, MD Phone: [phone]  Johnny Bradley is a 58 y.o. male who presents today for f/u.  CC: htn, hld, obesity, skin lesions  Hypertension: Typically blood pressure is similar to today when he checks it.  Taking amlodipine.  No chest pain, shortness of breath, or edema.  Hyperlipidemia: Taking Crestor.  No abdominal pain or myalgias.  Obesity: He has changed from eating lunch out to having any juice that he makes himself at lunch.  He eats a normal dinner.  He has cut down on his beer intake as well.  He is quite active at work.  Skin lesion: He saw dermatology on Monday.  No biopsies were taken per his report.  He was advised they were age spots.  He did have a nodule in the webspace between his thumb and index finger on his right hand.  He notes the dermatologist thought it may have been a cyst.  He notes that gets larger and then gets smaller at times.  No significant pain related to this.  Noticed it recently.  Social History   Tobacco Use  Smoking Status Never Smoker  Smokeless Tobacco Never Used     ROS see history of present illness  Objective  Physical Exam Vitals:   10/02/18 0808  BP: 118/80  Pulse: 70  Temp: 97.9 F (36.6 C)  SpO2: 97%    BP Readings from Last 3 Encounters:  10/02/18 118/80  05/29/18 132/88  05/03/18 118/82   Wt Readings from Last 3 Encounters:  10/02/18 245 lb 12 oz (111.5 kg)  05/29/18 252 lb 12.8 oz (114.7 kg)  05/03/18 251 lb 4 oz (114 kg)    Physical Exam Constitutional:      General: He is not in acute distress.    Appearance: He is not diaphoretic.  Cardiovascular:     Rate and Rhythm: Normal rate and regular rhythm.     Heart sounds: Normal heart sounds.  Pulmonary:     Effort: Pulmonary effort is normal.     Breath sounds: Normal breath sounds.  Musculoskeletal:     Comments: Small nodule noted in the webspace between the thumb and index finger, not specifically attached to a tendon, nontender, no swelling  in this area  Skin:    General: Skin is warm and dry.  Neurological:     Mental Status: He is alert.      Assessment/Plan: Please see individual problem list.  HLD (hyperlipidemia) Check lipid panel.  Continue Crestor.  Hypertension Well-controlled.  Continue amlodipine.  Check CMP.  Skin lesion Has been evaluated by dermatology.  Subcutaneous nodule of right hand Potentially could be a cyst.  Does not seem to be attached to a tendon.  Discussed ice.  Discussed monitoring and if not resolving or if it becomes larger or painful letting us know and we can refer to orthopedics.   Orders Placed This Encounter  Procedures  . Comp Met (CMET)  . Lipid panel    Meds ordered this encounter  Medications  . amLODipine (NORVASC) 5 MG tablet    Sig: Take 1 tablet (5 mg total) by mouth daily.    Dispense:  90 tablet    Refill:  3  . rosuvastatin (CRESTOR) 20 MG tablet    Sig: Take 1 tablet (20 mg total) by mouth daily.    Dispense:  90 tablet    Refill:  Freer, MD [place]

## 2018-10-02 NOTE — Assessment & Plan Note (Signed)
Well-controlled.  Continue amlodipine.  Check CMP. 

## 2018-10-02 NOTE — Patient Instructions (Signed)
Nice to see you. Please continue to work on diet and exercise. Please monitor the nodule in your right hand and if it continues to be an issue or enlarges please let us know so we can refer you to orthopedics.

## 2018-11-29 ENCOUNTER — Other Ambulatory Visit: Payer: Self-pay | Admitting: Family Medicine

## 2019-04-10 ENCOUNTER — Other Ambulatory Visit: Payer: Self-pay

## 2019-04-14 ENCOUNTER — Encounter: Payer: Self-pay | Admitting: Family Medicine

## 2019-04-14 ENCOUNTER — Other Ambulatory Visit: Payer: Self-pay

## 2019-04-14 ENCOUNTER — Ambulatory Visit: Payer: BC Managed Care – PPO | Admitting: Family Medicine

## 2019-04-14 VITALS — BP 110/80 | HR 75 | Temp 97.1°F | Ht 70.0 in | Wt 258.6 lb

## 2019-04-14 DIAGNOSIS — E782 Mixed hyperlipidemia: Secondary | ICD-10-CM | POA: Diagnosis not present

## 2019-04-14 DIAGNOSIS — R7303 Prediabetes: Secondary | ICD-10-CM

## 2019-04-14 DIAGNOSIS — Z23 Encounter for immunization: Secondary | ICD-10-CM

## 2019-04-14 DIAGNOSIS — Z809 Family history of malignant neoplasm, unspecified: Secondary | ICD-10-CM

## 2019-04-14 DIAGNOSIS — I10 Essential (primary) hypertension: Secondary | ICD-10-CM | POA: Diagnosis not present

## 2019-04-14 DIAGNOSIS — K047 Periapical abscess without sinus: Secondary | ICD-10-CM | POA: Diagnosis not present

## 2019-04-14 LAB — COMPREHENSIVE METABOLIC PANEL
ALT: 28 U/L (ref 0–53)
AST: 27 U/L (ref 0–37)
Albumin: 4.7 g/dL (ref 3.5–5.2)
Alkaline Phosphatase: 54 U/L (ref 39–117)
BUN: 13 mg/dL (ref 6–23)
CO2: 30 mEq/L (ref 19–32)
Calcium: 9.1 mg/dL (ref 8.4–10.5)
Chloride: 102 mEq/L (ref 96–112)
Creatinine, Ser: 0.89 mg/dL (ref 0.40–1.50)
GFR: 87.18 mL/min (ref >60.00)
Glucose, Bld: 105 mg/dL — ABNORMAL HIGH (ref 70–99)
Potassium: 4.4 mEq/L (ref 3.5–5.1)
Sodium: 140 mEq/L (ref 135–145)
Total Bilirubin: 0.6 mg/dL (ref 0.2–1.2)
Total Protein: 7 g/dL (ref 6.0–8.3)

## 2019-04-14 LAB — HEMOGLOBIN A1C: Hgb A1c MFr Bld: 5.8 % (ref 4.6–6.5)

## 2019-04-14 LAB — LDL CHOLESTEROL, DIRECT: Direct LDL: 92 mg/dL

## 2019-04-14 MED ORDER — AMOXICILLIN-POT CLAVULANATE 875-125 MG PO TABS: 1.0000 | ORAL_TABLET | Freq: Two times a day (BID) | ORAL | 0 refills | Status: DC

## 2019-04-14 NOTE — Progress Notes (Signed)
Tommi Rumps, MD Phone: [phone]  Johnny Bradley is a 58 y.o. male who presents today for f/u.  HYPERTENSION  Disease Monitoring  Home BP Monitoring similar to today Chest pain- no    Dyspnea- no Medications  Compliance-  Taking amlodpine.  Edema- no  HYPERLIPIDEMIA Symptoms Chest pain on exertion:  no   Leg claudication:   no Medications: Compliance- taking crestor Right upper quadrant pain- no  Muscle aches- no  Prediabetes- walking 10000 steps daily. Taking in smoothies for meals with vegetable protein in them.  No polyuria or polydipsia.  Dental infection: Patient notes he has been wearing visaline for some time now.  He has developed an area on his right lower posterior outer gum that has been sore and bleeds.  He will scrub it and use dental floss and a WaterPik and that will seem to help as well.  It does get pretty painful.  He has not noticed some pus though it has drained blood.  He will occasionally feel a pop.  No jaw swelling.  No fevers.  Family history of angiosarcoma: Patient notes his brother was diagnosed with angiosarcoma earlier this year and passed away pretty quickly afterwards.  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  Social History   Tobacco Use  Smoking Status Never Smoker  Smokeless Tobacco Never Used     ROS see history of present illness  Objective  Physical Exam Vitals:   04/14/19 0812  BP: 110/80  Pulse: 75  Temp: (!) 97.1 F (36.2 C)  SpO2: 98%    BP Readings from Last 3 Encounters:  04/14/19 110/80  10/02/18 118/80  05/29/18 132/88   Wt Readings from Last 3 Encounters:  04/14/19 258 lb 9.6 oz (117.3 kg)  10/02/18 245 lb 12 oz (111.5 kg)  05/29/18 252 lb 12.8 oz (114.7 kg)    Physical Exam Constitutional:      General: He is not in acute distress.    Appearance: He is not diaphoretic.  HENT:     Head: Normocephalic and atraumatic.   Comments: No apparent jaw swelling or erythema over the right jaw    Mouth/Throat:     Mouth: Mucous membranes are moist.     Pharynx: Oropharynx is clear.   Cardiovascular:     Rate and Rhythm: Normal rate and regular rhythm.     Heart sounds: Normal heart sounds.  Pulmonary:     Effort: Pulmonary effort is normal.     Breath sounds: Normal breath sounds.  Musculoskeletal:     Right lower leg: No edema.     Left lower leg: No edema.  Skin:    General: Skin is warm and dry.  Neurological:     Mental Status: He is alert.      Assessment/Plan: Please see individual problem list.  Hypertension Well-controlled.  Continue current regimen.  Dental infection Evidence of dental infection.  Will treat with Augmentin.  He will contact his dentist to schedule an appointment for evaluation.  Discussed return precautions.  Prediabetes Continue diet and exercise.  Check A1c.  HLD (hyperlipidemia) Continue Crestor.  Check LDL.  Family history of angiosarcoma Message sent to oncology regarding this.   This note will be forwarded to oncology (Dr Grayland Ormond) to get his input on the patients question regarding angiosarcoma.   Orders Placed This Encounter  Procedures  . Flu Vaccine QUAD 6+ mos PF IM (Fluarix Quad PF)  .  Comp Met (CMET)  . Direct LDL  . HgB A1c    Meds ordered this encounter  Medications  . amoxicillin-clavulanate (AUGMENTIN) 875-125 MG tablet    Sig: Take 1 tablet by mouth 2 (two) times daily.    Dispense:  14 tablet    Refill:  0     Tommi Rumps, MD [place]

## 2019-04-14 NOTE — Patient Instructions (Signed)
Nice to see you. We will treat you with an antibiotic for the dental infection. Please contact your dentist for an appointment.  If you do not hear from [place] regarding the oncology question in one week please contact us.  We will contact you with your lab results.

## 2019-04-15 DIAGNOSIS — Z809 Family history of malignant neoplasm, unspecified: Secondary | ICD-10-CM | POA: Insufficient documentation

## 2019-04-15 DIAGNOSIS — K047 Periapical abscess without sinus: Secondary | ICD-10-CM | POA: Insufficient documentation

## 2019-04-15 NOTE — Assessment & Plan Note (Signed)
Message sent to oncology regarding this.

## 2019-04-15 NOTE — Assessment & Plan Note (Signed)
Well-controlled.  Continue current regimen. 

## 2019-04-15 NOTE — Assessment & Plan Note (Signed)
Continue diet and exercise.  Check A1c. 

## 2019-04-15 NOTE — Assessment & Plan Note (Signed)
Evidence of dental infection.  Will treat with Augmentin.  He will contact his dentist to schedule an appointment for evaluation.  Discussed return precautions.

## 2019-04-15 NOTE — Assessment & Plan Note (Signed)
Continue Crestor.  Check LDL. 

## 2019-04-16 ENCOUNTER — Telehealth: Payer: Self-pay | Admitting: Family Medicine

## 2019-04-16 NOTE — Telephone Encounter (Signed)
Please let the patient know that the oncologist did not know of a genetic predisposition to angiosarcoma and noted that there are not any screening tests for angiosarcoma. The oncologist recommended that the patient be vigilant to any new symptoms that may arise. If he develops any new soft tissue lesions or any constitutional symptoms such as fatigue, unintentional weight loss, fatigue then he needs to let us know.

## 2019-04-16 NOTE — Telephone Encounter (Signed)
-----   Message from Lloyd Huger, MD sent at 04/16/2019  7:30 AM EDT ----- Randall Hiss- To my knowledge there is no genetic predisposition to angiosarcoma, but as you point out it's pretty rare so I guess we truly don't know.  The radiation exposure I think is theoretically possible, but there are no tests we have for screening.  He will have to vigilant with any new symptoms that may arise.  Hope this helps.  -Tim ----- Message ----- From: [name], MD Sent: 04/15/2019   4:02 PM EDT To: Lloyd Huger, MD  Hi Dr Grayland Ormond,   I wanted to see if you could help me with a question. This patient noted his brother was diagnosed with and passed away from angiosarcoma earlier this year. He was not able to give me many details other than the diagnosis and that his brother passed away pretty quickly after the diagnosis. Given that this is a pretty rare type of cancer the patient wanted to know if this would be something that could run in families and if it there is some evaluation he needs to undergo. 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 Any guidance you can give would be much appreciated. Thanks.   Randall Hiss

## 2019-04-18 NOTE — Telephone Encounter (Signed)
I called and gave the pateint the message from the provider and he understood.  Nina,cma

## 2019-10-15 ENCOUNTER — Encounter: Payer: Self-pay | Admitting: Family Medicine

## 2019-10-15 ENCOUNTER — Telehealth (INDEPENDENT_AMBULATORY_CARE_PROVIDER_SITE_OTHER): Payer: BC Managed Care – PPO | Admitting: Family Medicine

## 2019-10-15 ENCOUNTER — Other Ambulatory Visit: Payer: Self-pay

## 2019-10-15 VITALS — Ht 70.0 in | Wt 249.0 lb

## 2019-10-15 DIAGNOSIS — R7303 Prediabetes: Secondary | ICD-10-CM | POA: Diagnosis not present

## 2019-10-15 DIAGNOSIS — I10 Essential (primary) hypertension: Secondary | ICD-10-CM

## 2019-10-15 DIAGNOSIS — Z125 Encounter for screening for malignant neoplasm of prostate: Secondary | ICD-10-CM | POA: Diagnosis not present

## 2019-10-15 DIAGNOSIS — M545 Low back pain, unspecified: Secondary | ICD-10-CM

## 2019-10-15 DIAGNOSIS — E782 Mixed hyperlipidemia: Secondary | ICD-10-CM

## 2019-10-15 DIAGNOSIS — Z6835 Body mass index (BMI) 35.0-35.9, adult: Secondary | ICD-10-CM

## 2019-10-15 NOTE — Assessment & Plan Note (Addendum)
Continue amlodipine.  He will check his blood pressure and let us know what his readings are.

## 2019-10-15 NOTE — Assessment & Plan Note (Signed)
Encouraged continued exercise and dietary changes. ?

## 2019-10-15 NOTE — Progress Notes (Signed)
Virtual Visit via video Note  This visit type was conducted due to national recommendations for restrictions regarding the COVID-19 pandemic (e.g. social distancing).  This format is felt to be most appropriate for this patient at this time.  All issues noted in this document were discussed and addressed.  No physical exam was performed (except for noted visual exam findings with Video Visits).   I connected with Johnny Bradley today at  8:30 AM EST by a video enabled telemedicine application or telephone and verified that I am speaking with the correct person using two identifiers. Location patient: home Location provider: work Persons participating in the virtual visit: patient, provider  I discussed the limitations, risks, security and privacy concerns of performing an evaluation and management service by telephone and the availability of in person appointments. I also discussed with the patient that there may be a patient responsible charge related to this service. The patient expressed understanding and agreed to proceed.  Reason for visit: follow-up  HPI: Obesity: Patient has cut down on beer.  He only has a beer if he goes out to dinner.  He was previously drinking a high quantity over the weekends of low calorie beer.  He has also cut down on sodas.  He is only down about 6 pounds.  He walks all the time for exercise.  Hypertension: Not checking consistently though he will check and send me his readings.  Taking amlodipine.  No chest pain or shortness of breath.  Hyperlipidemia: Taking Crestor.  No right upper quadrant pain or myalgias.  Right low back pain: Patient notes this has progressively been an issue over the last 1.5-2 months.  Started out as a noticeable issue and then has progressed to be uncomfortable at times.  Sometimes it is uncomfortable to sit.  No injury.  No radiation.  No numbness, weakness, or incontinence.  No dysuria.  No history of kidney stones.  It is not  tender.  There is no motion that insights discomfort.  Aleve does help.   ROS: See pertinent positives and negatives per HPI.  Past Medical History:  Diagnosis Date  . Back pain   . Hypertension   . Shoulder pain    Left    Past Surgical History:  Procedure Laterality Date  . KNEE ARTHROSCOPY Right    MCL Torn    Family History  Problem Relation Age of Onset  . Heart disease Father   . Stroke Paternal Grandmother     SOCIAL HX: Non-smoker   Current Outpatient Medications:  .  amLODipine (NORVASC) 5 MG tablet, TAKE 1 TABLET BY MOUTH EVERY DAY, Disp: 90 tablet, Rfl: 3 .  aspirin 81 MG tablet, Take 81 mg by mouth daily., Disp: , Rfl:  .  Cholecalciferol (VITAMIN D) 2000 units CAPS, Take by mouth., Disp: , Rfl:  .  Multiple Vitamins-Minerals (CENTRUM SILVER ADULT 50+ PO), Take 1 tablet by mouth daily., Disp: , Rfl:  .  Naproxen Sodium (ALEVE PO), Take 2 tablets by mouth as needed. , Disp: , Rfl:  .  Nutritional Supplements (JUICE PLUS FIBRE PO), Take by mouth., Disp: , Rfl:  .  rosuvastatin (CRESTOR) 20 MG tablet, Take 1 tablet (20 mg total) by mouth daily., Disp: 90 tablet, Rfl: 3  EXAM:  VITALS per patient if applicable:  GENERAL: alert, oriented, appears well and in no acute distress  HEENT: atraumatic, conjunttiva clear, no obvious abnormalities on inspection of external nose and ears  NECK: normal movements of the  head and neck  LUNGS: on inspection no signs of respiratory distress, breathing rate appears normal, no obvious gross SOB, gasping or wheezing  CV: no obvious cyanosis  MS: moves all visible extremities without noticeable abnormality  PSYCH/NEURO: pleasant and cooperative, no obvious depression or anxiety, speech and thought processing grossly intact  ASSESSMENT AND PLAN:  Discussed the following assessment and plan:  Right low back pain Recent ongoing issue over the last 6 to 8 weeks.  Given duration will obtain an x-ray.  Consider physical  therapy or exercises after the x-ray.  He can continue Aleve taken with food.  Prediabetes Check A1c.  Hypertension Continue amlodipine.  He will check his blood pressure and let us know what his readings are.  HLD (hyperlipidemia) Continue Crestor.  Check lipid panel.  Obesity Encouraged continued exercise and dietary changes.   Orders Placed This Encounter  Procedures  . DG Lumbar Spine Complete    Standing Status:   Future    Standing Expiration Date:   12/12/2020    Order Specific Question:   Reason for Exam (SYMPTOM  OR DIAGNOSIS REQUIRED)    Answer:   right low back pain for the past 6-8 weeks, no injury    Order Specific Question:   Preferred imaging location?    Answer:   [facility] Specific Question:   Radiology Contrast Protocol - do NOT remove file path    Answer:   [email or web address]  . Comp Met (CMET)    Standing Status:   Future    Standing Expiration Date:   10/14/2020  . Lipid panel    Standing Status:   Future    Standing Expiration Date:   10/14/2020  . HgB A1c    Standing Status:   Future    Standing Expiration Date:   10/14/2020  . PSA    Standing Status:   Future    Standing Expiration Date:   10/14/2020    No orders of the defined types were placed in this encounter.    I discussed the assessment and treatment plan with the patient. The patient was provided an opportunity to ask questions and all were answered. The patient agreed with the plan and demonstrated an understanding of the instructions.   The patient was advised to call back or seek an in-person evaluation if the symptoms worsen or if the condition fails to improve as anticipated.    Tommi Rumps, MD

## 2019-10-15 NOTE — Assessment & Plan Note (Signed)
Recent ongoing issue over the last 6 to 8 weeks.  Given duration will obtain an x-ray.  Consider physical therapy or exercises after the x-ray.  He can continue Aleve taken with food.

## 2019-10-15 NOTE — Assessment & Plan Note (Signed)
Check A1c. 

## 2019-10-15 NOTE — Assessment & Plan Note (Signed)
Continue Crestor. Check lipid panel.  

## 2019-10-23 ENCOUNTER — Ambulatory Visit (INDEPENDENT_AMBULATORY_CARE_PROVIDER_SITE_OTHER): Payer: BC Managed Care – PPO

## 2019-10-23 ENCOUNTER — Other Ambulatory Visit (INDEPENDENT_AMBULATORY_CARE_PROVIDER_SITE_OTHER): Payer: BC Managed Care – PPO

## 2019-10-23 ENCOUNTER — Other Ambulatory Visit: Payer: Self-pay

## 2019-10-23 DIAGNOSIS — Z125 Encounter for screening for malignant neoplasm of prostate: Secondary | ICD-10-CM | POA: Diagnosis not present

## 2019-10-23 DIAGNOSIS — E782 Mixed hyperlipidemia: Secondary | ICD-10-CM

## 2019-10-23 DIAGNOSIS — M545 Low back pain, unspecified: Secondary | ICD-10-CM

## 2019-10-23 DIAGNOSIS — R7303 Prediabetes: Secondary | ICD-10-CM | POA: Diagnosis not present

## 2019-10-23 LAB — LIPID PANEL
Cholesterol: 127 mg/dL (ref 0–200)
HDL: 37 mg/dL — ABNORMAL LOW (ref >39.00)
LDL Cholesterol: 69 mg/dL (ref 0–99)
NonHDL: 90
Total CHOL/HDL Ratio: 3
Triglycerides: 103 mg/dL (ref 0.0–149.0)
VLDL: 20.6 mg/dL (ref 0.0–40.0)

## 2019-10-23 LAB — COMPREHENSIVE METABOLIC PANEL
ALT: 28 U/L (ref 0–53)
AST: 22 U/L (ref 0–37)
Albumin: 4 g/dL (ref 3.5–5.2)
Alkaline Phosphatase: 50 U/L (ref 39–117)
BUN: 13 mg/dL (ref 6–23)
CO2: 29 mEq/L (ref 19–32)
Calcium: 9.1 mg/dL (ref 8.4–10.5)
Chloride: 104 mEq/L (ref 96–112)
Creatinine, Ser: 0.93 mg/dL (ref 0.40–1.50)
GFR: 82.72 mL/min (ref >60.00)
Glucose, Bld: 102 mg/dL — ABNORMAL HIGH (ref 70–99)
Potassium: 4.1 mEq/L (ref 3.5–5.1)
Sodium: 139 mEq/L (ref 135–145)
Total Bilirubin: 0.9 mg/dL (ref 0.2–1.2)
Total Protein: 6.7 g/dL (ref 6.0–8.3)

## 2019-10-23 LAB — HEMOGLOBIN A1C: Hgb A1c MFr Bld: 5.9 % (ref 4.6–6.5)

## 2019-10-23 LAB — PSA: PSA: 1.01 ng/mL (ref 0.10–4.00)

## 2019-11-27 ENCOUNTER — Telehealth: Payer: Self-pay | Admitting: Family Medicine

## 2019-11-27 DIAGNOSIS — R0681 Apnea, not elsewhere classified: Secondary | ICD-10-CM

## 2019-11-27 NOTE — Telephone Encounter (Signed)
Pt states that his wife wants him to have a sleep study done because she states that he stops breathing at night. Pt needs a referral. Please advise.

## 2019-11-27 NOTE — Telephone Encounter (Signed)
Pt states that his wife wants him to have a sleep study done because she states that he stops breathing at night. Pt needs a referral. Please advise. Mansoor Hillyard,cma

## 2019-11-28 NOTE — Telephone Encounter (Signed)
Sleep study ordered. Rasheedah, can you please arrange for this for the patient.

## 2019-12-03 ENCOUNTER — Other Ambulatory Visit: Payer: Self-pay | Admitting: Family Medicine

## 2019-12-03 DIAGNOSIS — E785 Hyperlipidemia, unspecified: Secondary | ICD-10-CM

## 2019-12-05 ENCOUNTER — Other Ambulatory Visit: Payer: Self-pay | Admitting: Family Medicine

## 2019-12-05 ENCOUNTER — Other Ambulatory Visit: Payer: Self-pay

## 2019-12-05 DIAGNOSIS — E785 Hyperlipidemia, unspecified: Secondary | ICD-10-CM

## 2019-12-05 MED ORDER — AMLODIPINE BESYLATE 5 MG PO TABS: 5.0000 mg | ORAL_TABLET | Freq: Every day | ORAL | 3 refills | Status: DC

## 2019-12-05 MED ORDER — ROSUVASTATIN CALCIUM 20 MG PO TABS: 20.0000 mg | ORAL_TABLET | Freq: Every day | ORAL | 2 refills | Status: DC

## 2019-12-08 NOTE — Telephone Encounter (Signed)
It was faxed to [facility] on 04/19.

## 2019-12-10 ENCOUNTER — Telehealth: Payer: Self-pay | Admitting: Family Medicine

## 2019-12-10 NOTE — Telephone Encounter (Signed)
Patient called asking about his sleep study, it was sent to [facility] on 12/08/2019, so will they call and schedule with the patient?  Johnny Bradley,cma

## 2019-12-10 NOTE — Telephone Encounter (Signed)
Patient is waiting to hear from the office about getting a sleep study done. Does he have to make an appointment with Dr. Caryl Bis first?

## 2019-12-11 NOTE — Telephone Encounter (Signed)
Order was fax to [facility] on 12/08/2019. Yes they will cal pt to sch appt.

## 2020-01-09 ENCOUNTER — Ambulatory Visit: Payer: BC Managed Care – PPO | Attending: Neurology

## 2020-01-09 DIAGNOSIS — G4733 Obstructive sleep apnea (adult) (pediatric): Secondary | ICD-10-CM | POA: Insufficient documentation

## 2020-01-12 ENCOUNTER — Other Ambulatory Visit: Payer: Self-pay

## 2020-01-20 ENCOUNTER — Telehealth: Payer: Self-pay | Admitting: Family Medicine

## 2020-01-20 DIAGNOSIS — G4730 Sleep apnea, unspecified: Secondary | ICD-10-CM

## 2020-01-20 DIAGNOSIS — E669 Obesity, unspecified: Secondary | ICD-10-CM

## 2020-01-20 DIAGNOSIS — G4733 Obstructive sleep apnea (adult) (pediatric): Secondary | ICD-10-CM

## 2020-01-20 HISTORY — DX: Obesity, unspecified: E66.9

## 2020-01-20 HISTORY — DX: Sleep apnea, unspecified: G47.30

## 2020-01-20 NOTE — Telephone Encounter (Signed)
I called and informed the patient that his sleep study results were positive for severe sleep apnea, I informd him that the provider put in an order for CPAP titration and they should contact him this week if they do not I informed him to call us and let us know.  Kennedee Kitzmiller,cma

## 2020-01-20 NOTE — Telephone Encounter (Signed)
Please let the patient know that I received his sleep study back.  He has severe sleep apnea and needs to have a CPAP titration.  I have signed the order for this.  If he does not hear anything regarding this in the next week or so he needs to contact us.

## 2020-02-03 ENCOUNTER — Ambulatory Visit (INDEPENDENT_AMBULATORY_CARE_PROVIDER_SITE_OTHER): Payer: BC Managed Care – PPO | Admitting: Family Medicine

## 2020-02-03 ENCOUNTER — Other Ambulatory Visit: Payer: Self-pay

## 2020-02-03 ENCOUNTER — Encounter: Payer: Self-pay | Admitting: Family Medicine

## 2020-02-03 DIAGNOSIS — M545 Low back pain, unspecified: Secondary | ICD-10-CM

## 2020-02-03 DIAGNOSIS — E782 Mixed hyperlipidemia: Secondary | ICD-10-CM | POA: Diagnosis not present

## 2020-02-03 DIAGNOSIS — M25569 Pain in unspecified knee: Secondary | ICD-10-CM | POA: Insufficient documentation

## 2020-02-03 DIAGNOSIS — I10 Essential (primary) hypertension: Secondary | ICD-10-CM

## 2020-02-03 DIAGNOSIS — M25562 Pain in left knee: Secondary | ICD-10-CM

## 2020-02-03 DIAGNOSIS — M25561 Pain in right knee: Secondary | ICD-10-CM

## 2020-02-03 DIAGNOSIS — G4733 Obstructive sleep apnea (adult) (pediatric): Secondary | ICD-10-CM

## 2020-02-03 DIAGNOSIS — M1990 Unspecified osteoarthritis, unspecified site: Secondary | ICD-10-CM

## 2020-02-03 DIAGNOSIS — G8929 Other chronic pain: Secondary | ICD-10-CM

## 2020-02-03 HISTORY — DX: Unspecified osteoarthritis, unspecified site: M19.90

## 2020-02-03 NOTE — Assessment & Plan Note (Signed)
Patient is seeing a specialist and is going to have PRP and stem cell treatment.  Discussed that PRP may be beneficial although there is not a lot of data on the stem cell treatment.

## 2020-02-03 NOTE — Patient Instructions (Signed)
Nice to see you. Please call to schedule your CPAP titration study. Please continue to work on diet and exercise.

## 2020-02-03 NOTE — Assessment & Plan Note (Signed)
Adequate control.  Continue current medication. 

## 2020-02-03 NOTE — Assessment & Plan Note (Signed)
Continue Crestor 

## 2020-02-03 NOTE — Progress Notes (Signed)
  Tommi Rumps, MD Phone: [phone]  Johnny Bradley is a 59 y.o. male who presents today for f/u.  HYPERTENSION  Disease Monitoring  Home BP Monitoring similar to today Chest pain- no    Dyspnea- no change to chronic issues with bending over Medications  Compliance-  Taking amlodipine  Edema- no  HYPERLIPIDEMIA Symptoms Chest pain on exertion:  no   Medications: Compliance- taking crestor Right upper quadrant pain- no  Muscle aches- no  Patient notes his prior side pains went away.  He was taking a supplement at that time and stopped and the pain went away then.  OSA: Patient had a sleep study that revealed severe OSA.  They recommended CPAP titration study.  He has not called to schedule this yet though he will do this later today.  Chronic knee pain: Patient is considering undergoing PRP and stem cell treatment at a clinic called [facility] in [place].  He does not see a downside to this other than cost.  He notes he needs to do something for his knees so he does not end up a cripple.      Social History   Tobacco Use  Smoking Status Never Smoker  Smokeless Tobacco Never Used     ROS see history of present illness  Objective  Physical Exam Vitals:   02/03/20 0913  BP: 130/80  Pulse: 69  Temp: (!) 96.8 F (36 C)  SpO2: 98%    BP Readings from Last 3 Encounters:  02/03/20 130/80  04/14/19 110/80  10/02/18 118/80   Wt Readings from Last 3 Encounters:  02/03/20 262 lb 9.6 oz (119.1 kg)  10/15/19 249 lb (112.9 kg)  04/14/19 258 lb 9.6 oz (117.3 kg)    Physical Exam Constitutional:      General: He is not in acute distress.    Appearance: He is not diaphoretic.  Cardiovascular:     Rate and Rhythm: Normal rate and regular rhythm.     Heart sounds: Normal heart sounds.  Pulmonary:     Effort: Pulmonary effort is normal.     Breath sounds: Normal breath sounds.  Musculoskeletal:     Right lower leg: No edema.     Left lower  leg: No edema.  Skin:    General: Skin is warm and dry.  Neurological:     Mental Status: He is alert.      Assessment/Plan: Please see individual problem list.  Hypertension Adequate control.  Continue current medication.  HLD (hyperlipidemia) Continue Crestor.  Right low back pain Resolved with discontinuing the supplement.  Monitor for recurrence.  OSA (obstructive sleep apnea) Severe on sleep study.  Encouraged him to call to schedule his CPAP titration.  Discussed that his blood pressure may improve on CPAP.  Chronic knee pain Patient is seeing a specialist and is going to have PRP and stem cell treatment.  Discussed that PRP may be beneficial although there is not a lot of data on the stem cell treatment.   No orders of the defined types were placed in this encounter.   No orders of the defined types were placed in this encounter.   This visit occurred during the SARS-CoV-2 public health emergency.  Safety protocols were in place, including screening questions prior to the visit, additional usage of staff PPE, and extensive cleaning of exam room while observing appropriate contact time as indicated for disinfecting solutions.    Tommi Rumps, MD [place]

## 2020-02-03 NOTE — Assessment & Plan Note (Signed)
Resolved with discontinuing the supplement.  Monitor for recurrence.

## 2020-02-03 NOTE — Assessment & Plan Note (Signed)
Severe on sleep study.  Encouraged him to call to schedule his CPAP titration.  Discussed that his blood pressure may improve on CPAP.

## 2020-02-05 ENCOUNTER — Telehealth: Payer: Self-pay

## 2020-02-06 NOTE — Telephone Encounter (Signed)
Signed. Please fax.

## 2020-02-06 NOTE — Telephone Encounter (Signed)
Faxed.  Sheika Coutts,cma

## 2020-03-02 ENCOUNTER — Ambulatory Visit: Payer: BC Managed Care – PPO | Attending: Otolaryngology

## 2020-03-02 DIAGNOSIS — G4733 Obstructive sleep apnea (adult) (pediatric): Secondary | ICD-10-CM | POA: Diagnosis present

## 2020-03-02 DIAGNOSIS — G4761 Periodic limb movement disorder: Secondary | ICD-10-CM | POA: Insufficient documentation

## 2020-03-03 ENCOUNTER — Other Ambulatory Visit: Payer: Self-pay

## 2020-03-12 ENCOUNTER — Telehealth: Payer: Self-pay | Admitting: Family Medicine

## 2020-03-12 NOTE — Telephone Encounter (Signed)
Please let the patient know that his CPAP titration result came to me and we are working on getting him a CPAP. Please fill in all the appropriate information on the order form and submit. I placed the form in the sign folder.

## 2020-03-16 NOTE — Telephone Encounter (Signed)
I called the patient and informed him that we received the titration results and I faxed all the necessary documents to [facility]  confirmation given. and they would reach out to him to give him the CPAP.  He understood.  Abdullah Rizzi,cma

## 2020-04-20 NOTE — Telephone Encounter (Signed)
Patient called in stated that he still does not have his CPAP no one has contacted him about the CPAP

## 2020-04-23 NOTE — Telephone Encounter (Signed)
I called [facility] and the stated they never received the fax for the CPAP, the information was faxed again today per patient request.  Karri Kallenbach,cma

## 2020-05-06 NOTE — Telephone Encounter (Signed)
Pt came into office and said he still haven't heard from [facility] regarding getting CPAP machine. I gave him the number to [facility]. He said if there is another place it can be sent to let him know. He would like a call about it.

## 2020-05-11 NOTE — Telephone Encounter (Signed)
I called and LVM for the patient to reach out to [facility] at [phone]. Davey Limas,cma

## 2020-07-28 ENCOUNTER — Telehealth: Payer: Self-pay

## 2020-07-28 NOTE — Telephone Encounter (Signed)
Can you find out why he needs a handicap placard? Has he had a change in his health status? Is he requiring a cane or a walker? Can he walk more than 200 feet without stopping?

## 2020-07-28 NOTE — Telephone Encounter (Signed)
Pt's son dropped off handicap placard application to be filled out and please call [phone] to be picked up. Placed in folder up front

## 2020-07-28 NOTE — Telephone Encounter (Signed)
The handicap renewal form is in the sign basket.  Lavonne Kinderman,cma

## 2020-07-29 NOTE — Telephone Encounter (Signed)
I called and spoke with the patient. Patient stated his health status has not changed but he has braces on both legs that he wears everyday and he uses a cane sometimes.  Patient stated he cannot walk 200 feet without stopping.  Morene Cecilio,cma

## 2020-08-02 NOTE — Telephone Encounter (Signed)
I called and informed the patient that his handicap application is ready for pickup he stated his sone has the same namne and will pickup for him.  Rigoberto Repass,cma

## 2020-08-02 NOTE — Telephone Encounter (Signed)
Signed. Please fill in our information and make available to the patient to pick up. Thanks.

## 2020-09-01 ENCOUNTER — Other Ambulatory Visit: Payer: Self-pay | Admitting: Family Medicine

## 2020-09-01 DIAGNOSIS — E785 Hyperlipidemia, unspecified: Secondary | ICD-10-CM

## 2020-09-15 ENCOUNTER — Encounter: Payer: Self-pay | Admitting: Family Medicine

## 2020-09-15 ENCOUNTER — Telehealth (INDEPENDENT_AMBULATORY_CARE_PROVIDER_SITE_OTHER): Payer: BC Managed Care – PPO | Admitting: Family Medicine

## 2020-09-15 VITALS — Ht 70.0 in | Wt 253.0 lb

## 2020-09-15 DIAGNOSIS — M79605 Pain in left leg: Secondary | ICD-10-CM

## 2020-09-15 DIAGNOSIS — M79604 Pain in right leg: Secondary | ICD-10-CM | POA: Diagnosis not present

## 2020-09-15 DIAGNOSIS — E785 Hyperlipidemia, unspecified: Secondary | ICD-10-CM

## 2020-09-15 DIAGNOSIS — M791 Myalgia, unspecified site: Secondary | ICD-10-CM | POA: Diagnosis not present

## 2020-09-15 DIAGNOSIS — M25561 Pain in right knee: Secondary | ICD-10-CM | POA: Insufficient documentation

## 2020-09-15 NOTE — Assessment & Plan Note (Addendum)
Undetermined cause.  This does not seem consistent with claudication or PAD given that it does improve to a certain degree with movement.  Does not seem consistent with DVT given that it comes and goes and there is no associated swelling or skin color change.  He has not done anything to injure himself so muscular strain would seem odd as well.  It does not seem to be consistent with neurogenic claudication either.  This could be a statin induced myalgia or some other muscular inflammatory issue.  There is also the potential that it could be related to the treatments he had on his knees.  I will have him stop his statin and see if that provides benefit.  I have advised that he contact the team that treated his knees recently and see if this can be related to that treatment.  I have also encouraged him to ask them if he can resume NSAIDs as he noted they advised him to hold off on those.  I believe an anti-inflammatory would be beneficial for his symptoms.  If the discontinuation of statin is not beneficial we may need to look at ABIs or other imaging.  We will proceed with checking lab work as well to look for an underlying cause.

## 2020-09-15 NOTE — Progress Notes (Signed)
Virtual Visit via video note  This visit type was conducted due to national recommendations for restrictions regarding the COVID-19 pandemic (e.g. social distancing).  This format is felt to be most appropriate for this patient at this time.  All issues noted in this document were discussed and addressed.  No physical exam was performed (except for noted visual exam findings with Video Visits).   I connected with Johnny Bradley today at  4:00 PM EST by a video enabled telemedicine application and verified that I am speaking with the correct person using two identifiers. Location patient: work Location provider: home office Persons participating in the virtual visit: patient, provider  I discussed the limitations, risks, security and privacy concerns of performing an evaluation and management service by telephone and the availability of in person appointments. I also discussed with the patient that there may be a patient responsible charge related to this service. The patient expressed understanding and agreed to proceed.  Reason for visit: same day visit  HPI: Leg pain: Patient notes 2 to 3 weeks of bilateral leg pain.  Notes that it switches between the sides.  Notes it is intermittent.  Typically it is much better in the morning when he gets up and then occurs about 30 minutes later and by the time he gets to work after sitting down in his car it is significant discomfort.  Notes it ranges from 5-10/10.  Mostly occurs below his knees though does occasionally encompass his knees.  There is no swelling, bruising, injury, or redness.  He has not lifted anything excessively heavy.  No new supplements.  Tylenol 1000 mg 2-3 times a day does provide benefit.  He did have platelet rich plasma and stem cell therapy on his knees several months ago.  He then had Synvisc injections though those treatments were not in the last several weeks.  It is not worse with standing up.  It is typically worse with  sitting.  Walking tends to help to a certain degree.  He does have chronic back pain though its not any worse than typical.   ROS: See pertinent positives and negatives per HPI.  Past Medical History:  Diagnosis Date  . Back pain   . Hypertension   . Shoulder pain    Left    Past Surgical History:  Procedure Laterality Date  . KNEE ARTHROSCOPY Right    MCL Torn    Family History  Problem Relation Age of Onset  . Heart disease Father   . Stroke Paternal Grandmother     SOCIAL HX: Non-smoker   Current Outpatient Medications:  .  amLODipine (NORVASC) 5 MG tablet, TAKE ONE TABLET BY MOUTH DAILY, Disp: 90 tablet, Rfl: 2 .  aspirin 81 MG tablet, Take 81 mg by mouth daily., Disp: , Rfl:  .  Cholecalciferol (VITAMIN D) 2000 units CAPS, Take by mouth., Disp: , Rfl:  .  Multiple Vitamins-Minerals (CENTRUM SILVER ADULT 50+ PO), Take 1 tablet by mouth daily., Disp: , Rfl:  .  Naproxen Sodium (ALEVE PO), Take 2 tablets by mouth as needed. , Disp: , Rfl:  .  Nutritional Supplements (JUICE PLUS FIBRE PO), Take by mouth., Disp: , Rfl:  .  rosuvastatin (CRESTOR) 20 MG tablet, TAKE ONE TABLET BY MOUTH DAILY, Disp: 90 tablet, Rfl: 1  EXAM:  VITALS per patient if applicable:  GENERAL: alert, oriented, appears well and in no acute distress  HEENT: atraumatic, conjunttiva clear, no obvious abnormalities on inspection of external nose and  ears  NECK: normal movements of the head and neck  LUNGS: on inspection no signs of respiratory distress, breathing rate appears normal, no obvious gross SOB, gasping or wheezing  CV: no obvious cyanosis  MS: moves all visible extremities without noticeable abnormality  PSYCH/NEURO: pleasant and cooperative, no obvious depression or anxiety, speech and thought processing grossly intact  ASSESSMENT AND PLAN:  Discussed the following assessment and plan:  Problem List Items Addressed This Visit    Leg pain, bilateral    Undetermined cause.  This  does not seem consistent with claudication or PAD given that it does improve to a certain degree with movement.  Does not seem consistent with DVT given that it comes and goes and there is no associated swelling or skin color change.  He has not done anything to injure himself so muscular strain would seem odd as well.  It does not seem to be consistent with neurogenic claudication either.  This could be a statin induced myalgia or some other muscular inflammatory issue.  There is also the potential that it could be related to the treatments he had on his knees.  I will have him stop his statin and see if that provides benefit.  I have advised that he contact the team that treated his knees recently and see if this can be related to that treatment.  I have also encouraged him to ask them if he can resume NSAIDs as he noted they advised him to hold off on those.  I believe an anti-inflammatory would be beneficial for his symptoms.  If the discontinuation of statin is not beneficial we may need to look at ABIs or other imaging.  We will proceed with checking lab work as well to look for an underlying cause.      Relevant Orders   CK (Creatine Kinase)   Sedimentation rate   Comp Met (CMET)   TSH    Other Visit Diagnoses    Myalgia    -  Primary   Relevant Orders   CK (Creatine Kinase)   Sedimentation rate   Comp Met (CMET)   TSH       I discussed the assessment and treatment plan with the patient. The patient was provided an opportunity to ask questions and all were answered. The patient agreed with the plan and demonstrated an understanding of the instructions.   The patient was advised to call back or seek an in-person evaluation if the symptoms worsen or if the condition fails to improve as anticipated.   Tommi Rumps, MD

## 2020-09-21 ENCOUNTER — Other Ambulatory Visit: Payer: BC Managed Care – PPO

## 2020-09-30 ENCOUNTER — Other Ambulatory Visit (INDEPENDENT_AMBULATORY_CARE_PROVIDER_SITE_OTHER): Payer: BC Managed Care – PPO

## 2020-09-30 ENCOUNTER — Other Ambulatory Visit: Payer: Self-pay

## 2020-09-30 DIAGNOSIS — M791 Myalgia, unspecified site: Secondary | ICD-10-CM | POA: Diagnosis not present

## 2020-09-30 DIAGNOSIS — M79604 Pain in right leg: Secondary | ICD-10-CM

## 2020-09-30 DIAGNOSIS — M79605 Pain in left leg: Secondary | ICD-10-CM

## 2020-09-30 LAB — COMPREHENSIVE METABOLIC PANEL
ALT: 24 U/L (ref 0–53)
AST: 19 U/L (ref 0–37)
Albumin: 4.3 g/dL (ref 3.5–5.2)
Alkaline Phosphatase: 44 U/L (ref 39–117)
BUN: 14 mg/dL (ref 6–23)
CO2: 31 mEq/L (ref 19–32)
Calcium: 9.3 mg/dL (ref 8.4–10.5)
Chloride: 102 mEq/L (ref 96–112)
Creatinine, Ser: 1.04 mg/dL (ref 0.40–1.50)
GFR: 77.5 mL/min (ref >60.00)
Glucose, Bld: 85 mg/dL (ref 70–99)
Potassium: 4.1 mEq/L (ref 3.5–5.1)
Sodium: 140 mEq/L (ref 135–145)
Total Bilirubin: 0.9 mg/dL (ref 0.2–1.2)
Total Protein: 6.8 g/dL (ref 6.0–8.3)

## 2020-09-30 LAB — CK: Total CK: 81 U/L (ref 7–232)

## 2020-09-30 LAB — SEDIMENTATION RATE: Sed Rate: 5 mm/hr (ref 0–20)

## 2020-09-30 LAB — TSH: TSH: 2.47 u[IU]/mL (ref 0.35–4.50)

## 2020-09-30 NOTE — Telephone Encounter (Cosign Needed)
Pt came in this morning for labs & assumed we would be rechecking his cholesterol in addition to the other labs since he stopped it due to leg pain. Please let me knownif you would like to add it (would need a future order also)

## 2020-09-30 NOTE — Telephone Encounter (Signed)
I added a future order for the lipid panel to be done in addition to the other future labs that were in the system.

## 2020-10-11 ENCOUNTER — Other Ambulatory Visit: Payer: Self-pay

## 2020-10-11 ENCOUNTER — Ambulatory Visit (INDEPENDENT_AMBULATORY_CARE_PROVIDER_SITE_OTHER): Payer: BC Managed Care – PPO | Admitting: Family Medicine

## 2020-10-11 ENCOUNTER — Encounter: Payer: Self-pay | Admitting: Family Medicine

## 2020-10-11 DIAGNOSIS — G4733 Obstructive sleep apnea (adult) (pediatric): Secondary | ICD-10-CM | POA: Diagnosis not present

## 2020-10-11 DIAGNOSIS — M79604 Pain in right leg: Secondary | ICD-10-CM | POA: Diagnosis not present

## 2020-10-11 DIAGNOSIS — M79605 Pain in left leg: Secondary | ICD-10-CM | POA: Diagnosis not present

## 2020-10-11 DIAGNOSIS — E782 Mixed hyperlipidemia: Secondary | ICD-10-CM | POA: Diagnosis not present

## 2020-10-11 NOTE — Patient Instructions (Signed)
Nice to see. We will check your cholesterol. We will determine what to do with a statin once your cholesterol returns.

## 2020-10-11 NOTE — Assessment & Plan Note (Signed)
Much improved.  I suspect this is related to his Crestor.  We will check a lipid panel today.  Consider reduced dosing or reduce frequency of the statin if cholesterol is not adequately controlled.

## 2020-10-11 NOTE — Assessment & Plan Note (Signed)
Check lipid panel  

## 2020-10-11 NOTE — Assessment & Plan Note (Signed)
Seems to be benefiting from his CPAP.  We will request a compliance report.  I encouraged him to use this nightly.  Discussed there would be some risk with strain on his heart and lungs if he were not to use it.

## 2020-10-11 NOTE — Progress Notes (Signed)
Tommi Rumps, MD Phone: [phone]  Johnny Bradley is a 60 y.o. male who presents today for f/u.  OSA CPAP use: using almost every night for at least 4 hours, new mask fits better Hypersomnia: no Well rested: yes CPAP company: [employer]  Leg pain: Patient notes this is gone.  Prior lab work-up was unremarkable.  He did discontinue the Crestor and notes the pain improved almost immediately.  He also started using the stairs last.  He had no injuries to his legs.  Social History   Tobacco Use  Smoking Status Never Smoker  Smokeless Tobacco Never Used    Current Outpatient Medications on File Prior to Visit  Medication Sig Dispense Refill  . amLODipine (NORVASC) 5 MG tablet TAKE ONE TABLET BY MOUTH DAILY 90 tablet 2  . aspirin 81 MG tablet Take 81 mg by mouth daily.    . Cholecalciferol (VITAMIN D) 2000 units CAPS Take by mouth.    . Multiple Vitamins-Minerals (CENTRUM SILVER ADULT 50+ PO) Take 1 tablet by mouth daily.    . Naproxen Sodium (ALEVE PO) Take 2 tablets by mouth as needed.     . Nutritional Supplements (JUICE PLUS FIBRE PO) Take by mouth.    . rosuvastatin (CRESTOR) 20 MG tablet TAKE ONE TABLET BY MOUTH DAILY 90 tablet 1   No current facility-administered medications on file prior to visit.     ROS see history of present illness  Objective  Physical Exam Vitals:   10/11/20 1445  BP: 120/80  Pulse: 82  Temp: 98.1 F (36.7 C)  SpO2: 98%    BP Readings from Last 3 Encounters:  10/11/20 120/80  02/03/20 130/80  04/14/19 110/80   Wt Readings from Last 3 Encounters:  10/11/20 266 lb (120.7 kg)  09/15/20 253 lb (114.8 kg)  02/03/20 262 lb 9.6 oz (119.1 kg)    Physical Exam Constitutional:      General: He is not in acute distress.    Appearance: He is not diaphoretic.  Cardiovascular:     Rate and Rhythm: Normal rate and regular rhythm.     Heart sounds: Normal heart sounds.  Pulmonary:     Effort: Pulmonary effort is normal.     Breath sounds:  Normal breath sounds.  Musculoskeletal:        General: No edema.     Right lower leg: No edema.     Left lower leg: No edema.     Comments: No lower leg tenderness  Skin:    General: Skin is warm and dry.  Neurological:     Mental Status: He is alert.      Assessment/Plan: Please see individual problem list.  Problem List Items Addressed This Visit    HLD (hyperlipidemia)    Check lipid panel.      Relevant Orders   Lipid panel   Leg pain, bilateral    Much improved.  I suspect this is related to his Crestor.  We will check a lipid panel today.  Consider reduced dosing or reduce frequency of the statin if cholesterol is not adequately controlled.      OSA (obstructive sleep apnea)    Seems to be benefiting from his CPAP.  We will request a compliance report.  I encouraged him to use this nightly.  Discussed there would be some risk with strain on his heart and lungs if he were not to use it.         This visit occurred during the SARS-CoV-2 public  health emergency.  Safety protocols were in place, including screening questions prior to the visit, additional usage of staff PPE, and extensive cleaning of exam room while observing appropriate contact time as indicated for disinfecting solutions.    Tommi Rumps, MD [place]

## 2020-10-12 LAB — LIPID PANEL
Cholesterol: 230 mg/dL — ABNORMAL HIGH (ref 0–200)
HDL: 50 mg/dL (ref >39.00)
NonHDL: 179.84
Total CHOL/HDL Ratio: 5
Triglycerides: 224 mg/dL — ABNORMAL HIGH (ref 0.0–149.0)
VLDL: 44.8 mg/dL — ABNORMAL HIGH (ref 0.0–40.0)

## 2020-10-12 LAB — LDL CHOLESTEROL, DIRECT: Direct LDL: 157 mg/dL

## 2020-10-12 NOTE — Progress Notes (Signed)
Compliance report in lab basket.  Nina,cma

## 2020-10-14 ENCOUNTER — Telehealth: Payer: Self-pay

## 2020-10-14 DIAGNOSIS — E782 Mixed hyperlipidemia: Secondary | ICD-10-CM

## 2020-10-14 NOTE — Telephone Encounter (Signed)
-----   Message from [name], MD sent at 10/14/2020  9:30 AM EST ----- Please let the patient know that his LDL cholesterol has worsened from where it was previously.  His total cholesterol is also elevated again.  I would suggest that we try a relatively low dose of pravastatin 3 days a week and see if he can tolerate that.  He would need to follow-up labs in about 6 weeks.

## 2020-10-15 MED ORDER — PRAVASTATIN SODIUM 20 MG PO TABS: ORAL_TABLET | ORAL | 3 refills | Status: DC

## 2020-10-15 NOTE — Telephone Encounter (Signed)
Pravastatin sent to pharmacy.  He will need lab work in 6 weeks.  Orders placed.

## 2020-11-26 ENCOUNTER — Other Ambulatory Visit: Payer: BC Managed Care – PPO

## 2020-11-30 ENCOUNTER — Other Ambulatory Visit: Payer: Self-pay | Admitting: Family Medicine

## 2021-08-28 ENCOUNTER — Other Ambulatory Visit: Payer: Self-pay | Admitting: Family Medicine

## 2021-09-20 ENCOUNTER — Other Ambulatory Visit: Payer: Self-pay | Admitting: Family Medicine

## 2021-09-20 DIAGNOSIS — E782 Mixed hyperlipidemia: Secondary | ICD-10-CM

## 2021-11-01 ENCOUNTER — Encounter: Payer: Self-pay | Admitting: Family Medicine

## 2021-11-01 ENCOUNTER — Ambulatory Visit (INDEPENDENT_AMBULATORY_CARE_PROVIDER_SITE_OTHER): Payer: BC Managed Care – PPO

## 2021-11-01 ENCOUNTER — Ambulatory Visit (INDEPENDENT_AMBULATORY_CARE_PROVIDER_SITE_OTHER): Payer: BC Managed Care – PPO | Admitting: Family Medicine

## 2021-11-01 ENCOUNTER — Other Ambulatory Visit: Payer: Self-pay

## 2021-11-01 VITALS — BP 120/82 | HR 74 | Temp 97.9°F | Ht 70.0 in | Wt 248.0 lb

## 2021-11-01 DIAGNOSIS — M25562 Pain in left knee: Secondary | ICD-10-CM

## 2021-11-01 DIAGNOSIS — E782 Mixed hyperlipidemia: Secondary | ICD-10-CM | POA: Diagnosis not present

## 2021-11-01 DIAGNOSIS — M25561 Pain in right knee: Secondary | ICD-10-CM

## 2021-11-01 DIAGNOSIS — M25511 Pain in right shoulder: Secondary | ICD-10-CM | POA: Diagnosis not present

## 2021-11-01 DIAGNOSIS — R109 Unspecified abdominal pain: Secondary | ICD-10-CM | POA: Insufficient documentation

## 2021-11-01 DIAGNOSIS — Z23 Encounter for immunization: Secondary | ICD-10-CM | POA: Diagnosis not present

## 2021-11-01 DIAGNOSIS — R7303 Prediabetes: Secondary | ICD-10-CM

## 2021-11-01 DIAGNOSIS — I10 Essential (primary) hypertension: Secondary | ICD-10-CM

## 2021-11-01 LAB — COMPREHENSIVE METABOLIC PANEL
ALT: 25 U/L (ref 0–53)
AST: 22 U/L (ref 0–37)
Albumin: 4.2 g/dL (ref 3.5–5.2)
Alkaline Phosphatase: 48 U/L (ref 39–117)
BUN: 9 mg/dL (ref 6–23)
CO2: 32 mEq/L (ref 19–32)
Calcium: 9.4 mg/dL (ref 8.4–10.5)
Chloride: 101 mEq/L (ref 96–112)
Creatinine, Ser: 0.93 mg/dL (ref 0.40–1.50)
GFR: 87.96 mL/min (ref >60.00)
Glucose, Bld: 113 mg/dL — ABNORMAL HIGH (ref 70–99)
Potassium: 4.2 mEq/L (ref 3.5–5.1)
Sodium: 136 mEq/L (ref 135–145)
Total Bilirubin: 0.8 mg/dL (ref 0.2–1.2)
Total Protein: 6.5 g/dL (ref 6.0–8.3)

## 2021-11-01 LAB — LIPID PANEL
Cholesterol: 175 mg/dL (ref 0–200)
HDL: 41.6 mg/dL (ref >39.00)
LDL Cholesterol: 98 mg/dL (ref 0–99)
NonHDL: 133.38
Total CHOL/HDL Ratio: 4
Triglycerides: 175 mg/dL — ABNORMAL HIGH (ref 0.0–149.0)
VLDL: 35 mg/dL (ref 0.0–40.0)

## 2021-11-01 LAB — HEMOGLOBIN A1C: Hgb A1c MFr Bld: 5.8 % (ref 4.6–6.5)

## 2021-11-01 NOTE — Progress Notes (Signed)
?Tommi Rumps, MD ?Phone: [phone] ? ?Johnny Bradley is a 61 y.o. male who presents today for f/u. ? ?HYPERTENSION ?Disease Monitoring ?Home BP Monitoring reports at goal at home Chest pain- no    Dyspnea- no ?Medications ?Compliance-  taking amlodipine.  ?BMET ?   ?Component Value Date/Time  ? NA 140 09/30/2020 1038  ? K 4.1 09/30/2020 1038  ? CL 102 09/30/2020 1038  ? CO2 31 09/30/2020 1038  ? GLUCOSE 85 09/30/2020 1038  ? BUN 14 09/30/2020 1038  ? CREATININE 1.04 09/30/2020 1038  ? CALCIUM 9.3 09/30/2020 1038  ? ?HYPERLIPIDEMIA ?Symptoms ?Chest pain on exertion:  no   Leg claudication:   no ?Medications: ?Compliance- taking pravastatin Right upper quadrant pain- no ?Lipid Panel  ?   ?Component Value Date/Time  ? CHOL 230 (H) 10/11/2020 1507  ? TRIG 224.0 (H) 10/11/2020 1507  ? HDL 50.00 10/11/2020 1507  ? CHOLHDL 5 10/11/2020 1507  ? VLDL 44.8 (H) 10/11/2020 1507  ? Echo 69 10/23/2019 1043  ? LDLDIRECT 157.0 10/11/2020 1507  ? ?Stomach discomfort: Patient reports this only bothers him if he is sitting on his sofa.  Resolves after getting up.  No discomfort at any other time. ? ?Right shoulder pain: This has been going on for 2 to 3 months.  Feels like he lifted something heavy.  It has gradually worsened.  It initially was a 4/10 and now it is a 5/10.  He notes no injury.  He notes certain movements make it worse. ? ?Bilateral knee pain: This is a chronic ongoing issue related to arthritis.  He was getting some treatments through a specialty office though notes the pain was worse after getting those though it would help for a few months.  He is thinking about going to [facility] for treatment.  He notes occasionally the right knee gives out on him.  He has not had any x-rays recently.  He does report prior surgeries on both knees. ? ?Social History  ? ?Tobacco Use  ?Smoking Status Never  ?Smokeless Tobacco Never  ? ? ?Current Outpatient Medications on File Prior to Visit  ?Medication Sig Dispense Refill   ? amLODipine (NORVASC) 5 MG tablet TAKE ONE TABLET BY MOUTH DAILY 90 tablet 0  ? aspirin 81 MG tablet Take 81 mg by mouth daily.    ? Cholecalciferol (VITAMIN D) 2000 units CAPS Take by mouth.    ? Multiple Vitamins-Minerals (CENTRUM SILVER ADULT 50+ PO) Take 1 tablet by mouth daily.    ? Naproxen Sodium (ALEVE PO) Take 2 tablets by mouth as needed.     ? Nutritional Supplements (JUICE PLUS FIBRE PO) Take by mouth.    ? pravastatin (PRAVACHOL) 20 MG tablet TAKE 1 TABLET BY MOUTH ON MONDAY, WEDNESDAY, AND FRIDAY 36 tablet 0  ? ?No current facility-administered medications on file prior to visit.  ? ? ? ?ROS see history of present illness ? ?Objective ? ?Physical Exam ?Vitals:  ? 11/01/21 0941  ?BP: 120/82  ?Pulse: 74  ?Temp: 97.9 ?F (36.6 ?C)  ?SpO2: 98%  ? ? ?BP Readings from Last 3 Encounters:  ?11/01/21 120/82  ?10/11/20 120/80  ?02/03/20 130/80  ? ?Wt Readings from Last 3 Encounters:  ?11/01/21 248 lb (112.5 kg)  ?10/11/20 266 lb (120.7 kg)  ?09/15/20 253 lb (114.8 kg)  ? ? ?Physical Exam ?Constitutional:   ?   General: He is not in acute distress. ?   Appearance: He is not diaphoretic.  ?Cardiovascular:  ?  Rate and Rhythm: Normal rate and regular rhythm.  ?   Heart sounds: Normal heart sounds.  ?Pulmonary:  ?   Effort: Pulmonary effort is normal.  ?   Breath sounds: Normal breath sounds.  ?Musculoskeletal:  ?   Comments: Right shoulder with no tenderness, he has discomfort on active and passive internal range of motion and external range of motion as well as abduction, left shoulder with no discomfort on active range of motion, bilateral shoulders with full range of motion, positive empty can on the right, bilateral knees with no warmth or erythema, no tenderness in his bilateral knees, negative McMurray's bilaterally  ?Skin: ?   General: Skin is warm and dry.  ?Neurological:  ?   Mental Status: He is alert.  ? ? ? ?Assessment/Plan: Please see individual problem list. ? ?Problem List Items Addressed This  Visit   ? ? Abdominal discomfort  ?  I suspect this is a muscular issue given how he sits on the couch.  Does not occur at any other time to indicate any other cause.  He has a benign exam.  He will monitor. ?  ?  ? Bilateral knee pain  ?  Suspect osteoarthritis.  We will get a standing plain film to determine the severity. ?  ?  ? Relevant Orders  ? DG Knee Bilateral Standing AP  ? HLD (hyperlipidemia)  ?  Continue pravastatin 20 mg Monday Wednesday Friday.  Check lipid panel. ?  ?  ? Relevant Orders  ? Comp Met (CMET)  ? Lipid panel  ? Hypertension - Primary  ?  Adequately controlled.  He will continue amlodipine 5 mg once daily.  Check labs. ?  ?  ? Prediabetes  ? Relevant Orders  ? HgB A1c  ? Right shoulder pain  ?  I suspect this is related to rotator cuff impingement.  Given the chronicity we will get an x-ray.  He was provided with exercises to do for his shoulder. ?  ?  ? Relevant Orders  ? DG Shoulder Right  ? ?Other Visit Diagnoses   ? ? Need for immunization against influenza      ? Relevant Orders  ? Flu Vaccine QUAD 45moIM (Fluarix, Fluzone & Alfiuria Quad PF) (Completed)  ? ?  ? ? ?Return in about 6 months (around 05/04/2022) for Hypertension. ? ?This visit occurred during the SARS-CoV-2 public health emergency.  Safety protocols were in place, including screening questions prior to the visit, additional usage of staff PPE, and extensive cleaning of exam room while observing appropriate contact time as indicated for disinfecting solutions.  ? ? ?ETommi Rumps MD ?[facility]? ?

## 2021-11-01 NOTE — Patient Instructions (Signed)
Nice to see you. ?Please try the exercises for your right shoulder. ?We will contact you with your lab results and x-ray results. ? ?Shoulder Impingement Syndrome Rehab ?Ask your health care provider which exercises are safe for you. Do exercises exactly as told by your health care provider and adjust them as directed. It is normal to feel mild stretching, pulling, tightness, or discomfort as you do these exercises. Stop right away if you feel sudden pain or your pain gets worse. Do not begin these exercises until told by your health care provider. ?Stretching and range-of-motion exercise ?This exercise warms up your muscles and joints and improves the movement and flexibility of your shoulder. This exercise also helps to relieve pain and stiffness. ?Passive horizontal adduction ?In passive adduction, you use your other hand to move the injured arm toward your body. The injured arm does not move on its own. In this movement, your arm is moved across your body in the horizontal plane (horizontal adduction). ?Sit or stand and pull your left / right elbow across your chest, toward your other shoulder. Stop when you feel a gentle stretch in the back of your shoulder and upper arm. ?Keep your arm at shoulder height. ?Keep your arm as close to your body as you comfortably can. ?Hold for __________ seconds. ?Slowly return to the starting position. ?Repeat __________ times. Complete this exercise __________ times a day. ?Strengthening exercises ?These exercises build strength and endurance in your shoulder. Endurance is the ability to use your muscles for a long time, even after they get tired. ?External rotation, isometric ?This is an exercise in which you press the back of your wrist against a door frame without moving your shoulder joint (isometric). ?Stand or sit in a doorway, facing the door frame. ?Bend your left / right elbow and place the back of your wrist against the door frame. Only the back of your wrist should  be touching the frame. Keep your upper arm at your side. ?Gently press your wrist against the door frame, as if you are trying to push your arm away from your abdomen (external rotation). Press as hard as you are able without pain. ?Avoid shrugging your shoulder while you press your wrist against the door frame. Keep your shoulder blade tucked down toward the middle of your back. ?Hold for __________ seconds. ?Slowly release the tension, and relax your muscles completely before you repeat the exercise. ?Repeat __________ times. Complete this exercise __________ times a day. ?Internal rotation, isometric ?This is an exercise in which you press your palm against a door frame without moving your shoulder joint (isometric). ?Stand or sit in a doorway, facing the door frame. ?Bend your left / right elbow and place the palm of your hand against the door frame. Only your palm should be touching the frame. Keep your upper arm at your side. ?Gently press your hand against the door frame, as if you are trying to push your arm toward your abdomen (internal rotation). Press as hard as you are able without pain. ?Avoid shrugging your shoulder while you press your hand against the door frame. Keep your shoulder blade tucked down toward the middle of your back. ?Hold for __________ seconds. ?Slowly release the tension, and relax your muscles completely before you repeat the exercise. ?Repeat __________ times. Complete this exercise __________ times a day. ?Scapular protraction, supine ? ?Lie on your back on a firm surface (supine position). Hold a __________ weight in your left / right hand. ?Raise your  left / right arm straight into the air so your hand is directly above your shoulder joint. ?Push the weight into the air so your shoulder (scapula) lifts off the surface that you are lying on. The scapula will push up or forward (protraction). Do not move your head, neck, or back. ?Hold for __________ seconds. ?Slowly return to the  starting position. Let your muscles relax completely before you repeat this exercise. ?Repeat __________ times. Complete this exercise __________ times a day. ?Scapular retraction ? ?Sit in a stable chair without armrests, or stand up. ?Secure an exercise band to a stable object in front of you so the band is at shoulder height. ?Hold one end of the exercise band in each hand. Your palms should face down. ?Squeeze your shoulder blades together (retraction) and move your elbows slightly behind you. Do not shrug your shoulders upward while you do this. ?Hold for __________ seconds. ?Slowly return to the starting position. ?Repeat __________ times. Complete this exercise __________ times a day. ?Shoulder extension ? ?Sit in a stable chair without armrests, or stand up. ?Secure an exercise band to a stable object in front of you so the band is above shoulder height. ?Hold one end of the exercise band in each hand. ?Straighten your elbows and lift your hands up to shoulder height. ?Squeeze your shoulder blades together and pull your hands down to the sides of your thighs (extension). Stop when your hands are straight down by your sides. Do not let your hands go behind your body. ?Hold for __________ seconds. ?Slowly return to the starting position. ?Repeat __________ times. Complete this exercise __________ times a day. ?This information is not intended to replace advice given to you by your health care provider. Make sure you discuss any questions you have with your health care provider. ?Document Revised: 11/29/2018 Document Reviewed: 09/02/2018 ?Elsevier Patient Education ? [place]. ? ?

## 2021-11-01 NOTE — Assessment & Plan Note (Signed)
I suspect this is a muscular issue given how he sits on the couch.  Does not occur at any other time to indicate any other cause.  He has a benign exam.  He will monitor. ?

## 2021-11-01 NOTE — Assessment & Plan Note (Signed)
Adequately controlled.  He will continue amlodipine 5 mg once daily.  Check labs. ?

## 2021-11-01 NOTE — Assessment & Plan Note (Signed)
Suspect osteoarthritis.  We will get a standing plain film to determine the severity. ?

## 2021-11-01 NOTE — Assessment & Plan Note (Signed)
I suspect this is related to rotator cuff impingement.  Given the chronicity we will get an x-ray.  He was provided with exercises to do for his shoulder. ?

## 2021-11-01 NOTE — Assessment & Plan Note (Signed)
Continue pravastatin 20 mg Monday Wednesday Friday.  Check lipid panel. ?

## 2021-11-02 ENCOUNTER — Encounter: Payer: Self-pay | Admitting: Family Medicine

## 2021-11-03 ENCOUNTER — Other Ambulatory Visit: Payer: Self-pay | Admitting: Family Medicine

## 2021-11-15 ENCOUNTER — Encounter: Payer: Self-pay | Admitting: Orthopaedic Surgery

## 2021-11-15 ENCOUNTER — Ambulatory Visit (INDEPENDENT_AMBULATORY_CARE_PROVIDER_SITE_OTHER): Payer: BC Managed Care – PPO | Admitting: Orthopaedic Surgery

## 2021-11-15 ENCOUNTER — Other Ambulatory Visit: Payer: Self-pay

## 2021-11-15 ENCOUNTER — Ambulatory Visit (INDEPENDENT_AMBULATORY_CARE_PROVIDER_SITE_OTHER): Payer: BC Managed Care – PPO

## 2021-11-15 VITALS — Ht 70.0 in | Wt 246.0 lb

## 2021-11-15 DIAGNOSIS — M1711 Unilateral primary osteoarthritis, right knee: Secondary | ICD-10-CM | POA: Insufficient documentation

## 2021-11-15 NOTE — Progress Notes (Signed)
? ?Office Visit Note ?  ?Patient: Johnny Bradley           ?Date of Birth: 1961/03/18           ?MRN: [id number] ?Visit Date: 11/15/2021 ?             ?Requested by: Johnny Haven, MD ?[address] ?[address] ?[place],  [place] [address] ?PCP: Johnny Haven, MD ? ? ?Assessment & Plan: ?Visit Diagnoses:  ?1. Primary osteoarthritis of right knee   ? ? ?Plan: Impression is advanced tricompartmental right knee DJD.  He is bone-on-bone in the medial compartment and patellofemoral compartments.  Based on his options he has elected to move forward with right total knee replacement.  He would like to have this done sometime in August.  Johnny Bradley will call the patient to schedule surgery. ? ?Follow-Up Instructions: No follow-ups on file.  ? ?Orders:  ?Orders Placed This Encounter  ?Procedures  ? XR KNEE 3 VIEW RIGHT  ? ?No orders of the defined types were placed in this encounter. ? ? ? ? Procedures: ?No procedures performed ? ? ?Clinical Data: ?No additional findings. ? ? ?Subjective: ?Chief Complaint  ?Patient presents with  ? Right Knee - Pain  ? ? ?HPI ? ?Johnny Bradley is a 61 year old gentleman here for evaluation of chronic severe right knee pain for years.  Denies any injuries.  Had a prior meniscal surgery in 2007 in [place].  He did not feel significant relief from the surgeries.  He has been living with the pain for the last 26 years.  He wears a knee brace for ambulation.  He has difficulty with increased walking and standing and activities.  He has tried extensive physical therapy and conservative management with medications and PRP and stem cell injections.  The knee feels like it gives away unexpectedly. ? ?Review of Systems  ?Constitutional: Negative.   ?All other systems reviewed and are negative. ? ? ?Objective: ?Vital Signs: Ht '5\' 10"'$  (1.778 m)   Wt 246 lb (111.6 kg)   BMI 35.30 kg/m?  ? ?Physical Exam ?Vitals and nursing note reviewed.  ?Constitutional:   ?   Appearance: He is well-developed.  ?HENT:  ?    Head: Normocephalic and atraumatic.  ?Eyes:  ?   Pupils: Pupils are equal, round, and reactive to light.  ?Pulmonary:  ?   Effort: Pulmonary effort is normal.  ?Abdominal:  ?   Palpations: Abdomen is soft.  ?Musculoskeletal:     ?   General: Normal range of motion.  ?   Cervical back: Neck supple.  ?Skin: ?   General: Skin is warm.  ?Neurological:  ?   Mental Status: He is alert and oriented to person, place, and time.  ?Psychiatric:     ?   Behavior: Behavior normal.     ?   Thought Content: Thought content normal.     ?   Judgment: Judgment normal.  ? ? ?Ortho Exam ? ?Examination right knee shows 2+ patellofemoral crepitus through arc of motion.  Collaterals and cruciates are stable.  Mild varus deformity.  Trace effusion. ? ?Specialty Comments:  ?No specialty comments available. ? ?Imaging: ?XR KNEE 3 VIEW RIGHT ? ?Result Date: 11/15/2021 ?Widespread advanced degenerative changes of the knee.  Medial compartment is bone-on-bone.  ? ? ?PMFS History: ?Patient Active Problem List  ? Diagnosis Date Noted  ? Primary osteoarthritis of right knee 11/15/2021  ? Right shoulder pain 11/01/2021  ? Abdominal discomfort 11/01/2021  ? Bilateral  knee pain 09/15/2020  ? OSA (obstructive sleep apnea) 02/03/2020  ? Chronic knee pain 02/03/2020  ? Family history of angiosarcoma 04/15/2019  ? Prediabetes 04/14/2019  ? HLD (hyperlipidemia) 10/02/2018  ? Subcutaneous nodule of right hand 10/02/2018  ? Skin lesion 05/29/2018  ? Hypertension 12/03/2017  ? Obesity 05/25/2017  ? Right low back pain 12/19/2014  ? ?Past Medical History:  ?Diagnosis Date  ? Back pain   ? Hypertension   ? Shoulder pain   ? Left  ?  ?Family History  ?Problem Relation Age of Onset  ? Heart disease Father   ? Stroke Paternal Grandmother   ?  ?Past Surgical History:  ?Procedure Laterality Date  ? KNEE ARTHROSCOPY Right   ? [facility] Torn  ? ?Social History  ? ?Occupational History  ? Not on file  ?Tobacco Use  ? Smoking status: Never  ? Smokeless tobacco: Never   ?Vaping Use  ? Vaping Use: Never used  ?Substance and Sexual Activity  ? Alcohol use: Yes  ?  Alcohol/week: 12.0 standard drinks  ?  Types: 12 Cans of beer per week  ?  Comment: On weekends- [address]   ? Drug use: No  ? Sexual activity: Yes  ?  Partners: Female  ?  Comment: Wife  ? ? ? ? ? ? ?

## 2021-11-25 ENCOUNTER — Other Ambulatory Visit: Payer: Self-pay | Admitting: Family Medicine

## 2021-12-10 ENCOUNTER — Other Ambulatory Visit: Payer: Self-pay | Admitting: Family Medicine

## 2021-12-10 DIAGNOSIS — E782 Mixed hyperlipidemia: Secondary | ICD-10-CM

## 2022-02-25 ENCOUNTER — Other Ambulatory Visit: Payer: Self-pay | Admitting: Family Medicine

## 2022-02-28 ENCOUNTER — Other Ambulatory Visit: Payer: Self-pay | Admitting: Family Medicine

## 2022-02-28 DIAGNOSIS — E782 Mixed hyperlipidemia: Secondary | ICD-10-CM

## 2022-03-01 ENCOUNTER — Telehealth: Payer: Self-pay

## 2022-03-01 NOTE — Telephone Encounter (Signed)
Patient states he will be leaving on his vacation to [place] on 03/08/2022 and will return on 03/31/2022.  Patient states he does not have enough pravastatin (PRAVACHOL) 20 MG tablet to make it through his trip.  Patient states he would like to request a refill.  Patient states he spoke with his pharmacist and understood them to state that his insurance will not pay for a 30-day supply, but will pay for a 90-day supply.  Patient states he would like to schedule an appointment for labs upon his return.  *Patient states his preferred pharmacy is [facility] on [address] in [place].

## 2022-03-01 NOTE — Telephone Encounter (Signed)
Medication sent to pharmacy.  Cinnamon Morency,cma  ?

## 2022-04-10 NOTE — Pre-Procedure Instructions (Signed)
Surgical Instructions    Your procedure is scheduled on Monday, August 28th.  Report to [facility] Main Entrance "A" at 6:45 A.M., then check in with the Admitting office.  Call this number if you have problems the morning of surgery:  [phone]   If you have any questions prior to your surgery date call [phone]: Open Monday-Friday 8am-4pm    Remember:  Do not eat after midnight the night before your surgery  You may drink clear liquids until 5:45 a.m. the morning of your surgery.   Clear liquids allowed are: Water, Non-Citrus Juices (without pulp), Carbonated Beverages, Clear Tea, Black Coffee Only (NO MILK, CREAM OR POWDERED CREAMER of any kind), and Gatorade.   Enhanced Recovery after Surgery for Orthopedics Enhanced Recovery after Surgery is a protocol used to improve the stress on your body and your recovery after surgery.  Patient Instructions  The day of surgery (if you do NOT have diabetes):  Drink ONE (1) Pre-Surgery Clear Ensure by 5:45 am the morning of surgery   This drink was given to you during your hospital  pre-op appointment visit. Nothing else to drink after completing the  Pre-Surgery Clear Ensure.         If you have questions, please contact your surgeon's office.     Take these medicines the morning of surgery with A SIP OF WATER  amLODipine (NORVASC)  pravastatin (PRAVACHOL)  acetaminophen (TYLENOL)-as needed  Follow your surgeon's instructions on when to stop Aspirin.  If no instructions were given by your surgeon then you will need to call the office to get those instructions.    As of today, STOP taking any Aleve, Naproxen, Ibuprofen, Motrin, Advil, Goody's, BC's, all herbal medications, fish oil, and all vitamins.                     Do NOT Smoke (Tobacco/Vaping) for 24 hours prior to your procedure.  If you use a CPAP at night, you may bring your mask/headgear for your overnight stay.   Contacts, glasses, piercing's, hearing aid's,  dentures or partials may not be worn into surgery, please bring cases for these belongings.    For patients admitted to the hospital, discharge time will be determined by your treatment team.   Patients discharged the day of surgery will not be allowed to drive home, and someone needs to stay with them for 24 hours.  SURGICAL WAITING ROOM VISITATION Patients having surgery or a procedure may have no more than 2 support people in the waiting area - these visitors may rotate.   Children under the age of 6 must have an adult with them who is not the patient. If the patient needs to stay at the hospital during part of their recovery, the visitor guidelines for inpatient rooms apply. Pre-op nurse will coordinate an appropriate time for 1 support person to accompany patient in pre-op.  This support person may not rotate.   Please refer to the [facility] website for the visitor guidelines for Inpatients (after your surgery is over and you are in a regular room).    Special instructions:   [facility]- Preparing For Surgery  Before surgery, you can play an important role. Because skin is not sterile, your skin needs to be as free of germs as possible. You can reduce the number of germs on your skin by washing with CHG (chlorahexidine gluconate) Soap before surgery.  CHG is an antiseptic cleaner which kills germs and bonds with the  skin to continue killing germs even after washing.    Oral Hygiene is also important to reduce your risk of infection.  Remember - BRUSH YOUR TEETH THE MORNING OF SURGERY WITH YOUR REGULAR TOOTHPASTE  Please do not use if you have an allergy to CHG or antibacterial soaps. If your skin becomes reddened/irritated stop using the CHG.  Do not shave (including legs and underarms) for at least 48 hours prior to first CHG shower. It is OK to shave your face.  Please follow these instructions carefully.   Shower the NIGHT BEFORE SURGERY and the MORNING OF SURGERY  If you  chose to wash your hair, wash your hair first as usual with your normal shampoo.  After you shampoo, rinse your hair and body thoroughly to remove the shampoo.  Use CHG Soap as you would any other liquid soap. You can apply CHG directly to the skin and wash gently with a scrungie or a clean washcloth.   Apply the CHG Soap to your body ONLY FROM THE NECK DOWN.  Do not use on open wounds or open sores. Avoid contact with your eyes, ears, mouth and genitals (private parts). Wash Face and genitals (private parts)  with your normal soap.   Wash thoroughly, paying special attention to the area where your surgery will be performed.  Thoroughly rinse your body with warm water from the neck down.  DO NOT shower/wash with your normal soap after using and rinsing off the CHG Soap.  Pat yourself dry with a CLEAN TOWEL.  Wear CLEAN PAJAMAS to bed the night before surgery  Place CLEAN SHEETS on your bed the night before your surgery  DO NOT SLEEP WITH PETS.   Day of Surgery: Take a shower with CHG soap. Do not wear jewelry Do not wear lotions, powders, colognes, or deodorant. Men may shave face and neck. Do not bring valuables to the hospital.  [facility] is not responsible for any belongings or valuables. Wear Clean/Comfortable clothing the morning of surgery Remember to brush your teeth WITH YOUR REGULAR TOOTHPASTE.   Please read over the following fact sheets that you were given.    If you received a COVID test during your pre-op visit  it is requested that you wear a mask when out in public, stay away from anyone that may not be feeling well and notify your surgeon if you develop symptoms. If you have been in contact with anyone that has tested positive in the last 10 days please notify you surgeon.

## 2022-04-11 ENCOUNTER — Other Ambulatory Visit: Payer: Self-pay

## 2022-04-11 ENCOUNTER — Encounter (HOSPITAL_COMMUNITY): Payer: Self-pay

## 2022-04-11 ENCOUNTER — Encounter (HOSPITAL_COMMUNITY)
Admission: RE | Admit: 2022-04-11 | Discharge: 2022-04-11 | Disposition: A | Discharge status: Home / Self Care | Payer: BC Managed Care – PPO | Source: Ambulatory Visit | Attending: Orthopaedic Surgery | Admitting: Orthopaedic Surgery

## 2022-04-11 VITALS — BP 136/83 | HR 84 | Temp 98.0°F | Resp 18 | Ht 70.0 in | Wt 264.6 lb

## 2022-04-11 DIAGNOSIS — Z01818 Encounter for other preprocedural examination: Secondary | ICD-10-CM | POA: Diagnosis present

## 2022-04-11 DIAGNOSIS — M1711 Unilateral primary osteoarthritis, right knee: Secondary | ICD-10-CM | POA: Insufficient documentation

## 2022-04-11 DIAGNOSIS — I10 Essential (primary) hypertension: Secondary | ICD-10-CM | POA: Diagnosis not present

## 2022-04-11 HISTORY — DX: Unspecified osteoarthritis, unspecified site: M19.90

## 2022-04-11 HISTORY — DX: Hyperlipidemia, unspecified: E78.5

## 2022-04-11 HISTORY — DX: Sleep apnea, unspecified: G47.30

## 2022-04-11 LAB — COMPREHENSIVE METABOLIC PANEL
ALT: 35 U/L (ref 0–44)
AST: 29 U/L (ref 15–41)
Albumin: 4.1 g/dL (ref 3.5–5.0)
Alkaline Phosphatase: 45 U/L (ref 38–126)
Anion gap: 8 (ref 5–15)
BUN: 13 mg/dL (ref 8–23)
CO2: 28 mmol/L (ref 22–32)
Calcium: 9.4 mg/dL (ref 8.9–10.3)
Chloride: 102 mmol/L (ref 98–111)
Creatinine, Ser: 0.98 mg/dL (ref 0.61–1.24)
GFR, Estimated: >60 mL/min (ref >60)
Glucose, Bld: 123 mg/dL — ABNORMAL HIGH (ref 70–99)
Potassium: 4 mmol/L (ref 3.5–5.1)
Sodium: 138 mmol/L (ref 135–145)
Total Bilirubin: 0.5 mg/dL (ref 0.3–1.2)
Total Protein: 7.1 g/dL (ref 6.5–8.1)

## 2022-04-11 LAB — CBC
HCT: 46.7 % (ref 39.0–52.0)
Hemoglobin: 15.7 g/dL (ref 13.0–17.0)
MCH: 32.2 pg (ref 26.0–34.0)
MCHC: 33.6 g/dL (ref 30.0–36.0)
MCV: 95.7 fL (ref 80.0–100.0)
Platelets: 160 10*3/uL (ref 150–400)
RBC: 4.88 MIL/uL (ref 4.22–5.81)
RDW: 11.9 % (ref 11.5–15.5)
WBC: 5.8 10*3/uL (ref 4.0–10.5)
nRBC: 0 % (ref 0.0–0.2)

## 2022-04-11 LAB — SURGICAL PCR SCREEN
MRSA, PCR: NEGATIVE
Staphylococcus aureus: NEGATIVE

## 2022-04-11 NOTE — Progress Notes (Signed)
PCP - Tommi Rumps, MD Cardiologist - denies  PPM/ICD - n/a  Chest x-ray - n/a EKG - 04/11/22 Stress Test - 05/13/18 ECHO - 04/04/18 Cardiac Cath - denies  Sleep Study - OSA+ CPAP - uses nightly  Blood Thinner Instructions: n/a Aspirin Instructions:LD 04/10/22.  ERAS Protcol -Clear liquids until 0545 DOS PRE-SURGERY Ensure or G2- Ensure provided.   COVID TEST- n/a  Anesthesia review: Yes, abnormal EKG  Patient denies shortness of breath, fever, cough and chest pain at PAT appointment   All instructions explained to the patient, with a verbal understanding of the material. Patient agrees to go over the instructions while at home for a better understanding. Patient also instructed to self quarantine after being tested for COVID-19. The opportunity to ask questions was provided.

## 2022-04-12 ENCOUNTER — Telehealth: Payer: Self-pay | Admitting: Radiology

## 2022-04-12 NOTE — Telephone Encounter (Signed)
FYI  Patient called from dental office stating he went to get dental implants done today, but is scheduled for total joint replacement on Monday, 04/17/2022 and was not sure that he should have it done. He already has the implants, but they were going to use novacaine and dig around the implant to place the crowns.  No current dental abscess or infection. Patient advised, per Dr. Erlinda Hong, do not have the dental work today and that he will need to reschedule this appointment for at least 3 months post joint replacement. Patient expressed understanding.

## 2022-04-13 ENCOUNTER — Other Ambulatory Visit: Payer: Self-pay | Admitting: Physician Assistant

## 2022-04-13 MED ORDER — ONDANSETRON HCL 4 MG PO TABS: 4.0000 mg | ORAL_TABLET | Freq: Three times a day (TID) | ORAL | 0 refills | Status: DC | PRN

## 2022-04-13 MED ORDER — DOCUSATE SODIUM 100 MG PO CAPS: 100.0000 mg | ORAL_CAPSULE | Freq: Every day | ORAL | 2 refills | Status: DC | PRN

## 2022-04-13 MED ORDER — OXYCODONE-ACETAMINOPHEN 5-325 MG PO TABS: 1.0000 | ORAL_TABLET | Freq: Four times a day (QID) | ORAL | 0 refills | Status: DC | PRN

## 2022-04-13 MED ORDER — METHOCARBAMOL 750 MG PO TABS: 750.0000 mg | ORAL_TABLET | Freq: Two times a day (BID) | ORAL | 2 refills | Status: DC | PRN

## 2022-04-13 MED ORDER — ASPIRIN 81 MG PO TBEC: 81.0000 mg | DELAYED_RELEASE_TABLET | Freq: Two times a day (BID) | ORAL | 0 refills | Status: AC

## 2022-04-14 MED ORDER — TRANEXAMIC ACID 1000 MG/10ML IV SOLN
2000.0000 mg | INTRAVENOUS | Status: DC
  Filled 2022-04-14: qty 20

## 2022-04-16 ENCOUNTER — Encounter (HOSPITAL_COMMUNITY): Payer: Self-pay | Admitting: Orthopaedic Surgery

## 2022-04-16 NOTE — Anesthesia Preprocedure Evaluation (Signed)
Anesthesia Evaluation  Patient identified by MRN, date of birth, ID band Patient awake    Reviewed: Allergy & Precautions, NPO status , Patient's Chart, lab work & pertinent test results  Airway Mallampati: II       Dental no notable dental hx.    Pulmonary sleep apnea ,    Pulmonary exam normal        Cardiovascular hypertension, Pt. on medications Normal cardiovascular exam     Neuro/Psych negative neurological ROS  negative psych ROS   GI/Hepatic Neg liver ROS,   Endo/Other  negative endocrine ROS  Renal/GU negative Renal ROS     Musculoskeletal  (+) Arthritis , Osteoarthritis,    Abdominal (+) + obese,   Peds  Hematology negative hematology ROS (+)   Anesthesia Other Findings    Inform patient that stress test was normal.    -------------------------------------------------------------------  Study Conclusions   - Left ventricle: The cavity size was normal. Wall thickness was  normal. Systolic function was normal. The estimated ejection  fraction was in the range of 50% to 55%.  - Aortic valve: Valve area (VTI): 2.39 cm^2. Valve area (Vmax):  2.78 cm^2. Valve area (Vmean): 2.65 cm^2.  - Mitral valve: There was mild regurgitation. Valve area by  continuity equation (using LVOT flow): 2.95 cm^2.    Reproductive/Obstetrics                            Anesthesia Physical Anesthesia Plan  ASA: 2  Anesthesia Plan: Spinal   Post-op Pain Management: Regional block*   Induction:   PONV Risk Score and Plan: 1 and Ondansetron, Dexamethasone, Midazolam and Propofol infusion  Airway Management Planned: Natural Airway and Simple Face Mask  Additional Equipment: None  Intra-op Plan:   Post-operative Plan:   Informed Consent: I have reviewed the patients History and Physical, chart, labs and discussed the procedure including the risks, benefits and alternatives for  the proposed anesthesia with the patient or authorized representative who has indicated his/her understanding and acceptance.       Plan Discussed with: CRNA  Anesthesia Plan Comments:        Anesthesia Quick Evaluation

## 2022-04-17 ENCOUNTER — Observation Stay (HOSPITAL_COMMUNITY)
Admission: RE | Admit: 2022-04-17 | Discharge: 2022-04-18 | Disposition: A | Discharge status: Home / Self Care | Payer: BC Managed Care – PPO | Attending: Orthopaedic Surgery | Admitting: Orthopaedic Surgery

## 2022-04-17 ENCOUNTER — Observation Stay (HOSPITAL_COMMUNITY): Payer: BC Managed Care – PPO

## 2022-04-17 ENCOUNTER — Ambulatory Visit (HOSPITAL_COMMUNITY): Payer: BC Managed Care – PPO | Admitting: Anesthesiology

## 2022-04-17 ENCOUNTER — Other Ambulatory Visit: Payer: Self-pay

## 2022-04-17 ENCOUNTER — Encounter (HOSPITAL_COMMUNITY)
Admission: RE | Disposition: A | Discharge status: Home / Self Care | Payer: Self-pay | Source: Home / Self Care | Attending: Orthopaedic Surgery

## 2022-04-17 ENCOUNTER — Encounter (HOSPITAL_COMMUNITY): Payer: Self-pay | Admitting: Orthopaedic Surgery

## 2022-04-17 ENCOUNTER — Ambulatory Visit (HOSPITAL_COMMUNITY): Payer: BC Managed Care – PPO | Admitting: Physician Assistant

## 2022-04-17 DIAGNOSIS — Z96652 Presence of left artificial knee joint: Secondary | ICD-10-CM

## 2022-04-17 DIAGNOSIS — Z79899 Other long term (current) drug therapy: Secondary | ICD-10-CM | POA: Diagnosis not present

## 2022-04-17 DIAGNOSIS — Z7982 Long term (current) use of aspirin: Secondary | ICD-10-CM | POA: Diagnosis not present

## 2022-04-17 DIAGNOSIS — I10 Essential (primary) hypertension: Secondary | ICD-10-CM | POA: Diagnosis not present

## 2022-04-17 DIAGNOSIS — M1711 Unilateral primary osteoarthritis, right knee: Principal | ICD-10-CM

## 2022-04-17 HISTORY — PX: TOTAL KNEE ARTHROPLASTY: SHX125

## 2022-04-17 SURGERY — ARTHROPLASTY, KNEE, TOTAL
Anesthesia: Spinal | Site: Knee | Laterality: Right

## 2022-04-17 MED ORDER — DEXAMETHASONE SODIUM PHOSPHATE 10 MG/ML IJ SOLN
INTRAMUSCULAR | Status: AC
  Filled 2022-04-17: qty 1

## 2022-04-17 MED ORDER — BUPIVACAINE-MELOXICAM ER 400-12 MG/14ML IJ SOLN
INTRAMUSCULAR | Status: DC | PRN
  Administered 2022-04-17: 400 mg

## 2022-04-17 MED ORDER — ONDANSETRON HCL 4 MG/2ML IJ SOLN
INTRAMUSCULAR | Status: AC
  Filled 2022-04-17: qty 2

## 2022-04-17 MED ORDER — METHOCARBAMOL 1000 MG/10ML IJ SOLN: 500.0000 mg | Freq: Four times a day (QID) | INTRAVENOUS | Status: DC | PRN

## 2022-04-17 MED ORDER — DEXAMETHASONE SODIUM PHOSPHATE 10 MG/ML IJ SOLN: 10.0000 mg | Freq: Once | INTRAMUSCULAR | Status: DC

## 2022-04-17 MED ORDER — VANCOMYCIN HCL 1000 MG IV SOLR
INTRAVENOUS | Status: AC
  Filled 2022-04-17: qty 20

## 2022-04-17 MED ORDER — DOCUSATE SODIUM 100 MG PO CAPS
100.0000 mg | ORAL_CAPSULE | Freq: Two times a day (BID) | ORAL | Status: DC
  Administered 2022-04-17 – 2022-04-18 (×2): 100 mg via ORAL
  Filled 2022-04-17 (×2): qty 1

## 2022-04-17 MED ORDER — CEFAZOLIN SODIUM-DEXTROSE 2-4 GM/100ML-% IV SOLN
2.0000 g | Freq: Four times a day (QID) | INTRAVENOUS | Status: AC
  Administered 2022-04-17 (×2): 2 g via INTRAVENOUS
  Filled 2022-04-17 (×2): qty 100

## 2022-04-17 MED ORDER — MENTHOL 3 MG MT LOZG: 1.0000 | LOZENGE | OROMUCOSAL | Status: DC | PRN

## 2022-04-17 MED ORDER — PHENOL 1.4 % MT LIQD: 1.0000 | OROMUCOSAL | Status: DC | PRN

## 2022-04-17 MED ORDER — ONDANSETRON HCL 4 MG PO TABS: 4.0000 mg | ORAL_TABLET | Freq: Four times a day (QID) | ORAL | Status: DC | PRN

## 2022-04-17 MED ORDER — LACTATED RINGERS IV SOLN: INTRAVENOUS | Status: DC

## 2022-04-17 MED ORDER — DEXAMETHASONE SODIUM PHOSPHATE 10 MG/ML IJ SOLN
INTRAMUSCULAR | Status: DC | PRN
  Administered 2022-04-17: 5 mg via INTRAVENOUS

## 2022-04-17 MED ORDER — CEFAZOLIN SODIUM-DEXTROSE 2-4 GM/100ML-% IV SOLN
INTRAVENOUS | Status: AC
  Filled 2022-04-17: qty 100

## 2022-04-17 MED ORDER — OXYCODONE HCL ER 10 MG PO T12A
10.0000 mg | EXTENDED_RELEASE_TABLET | Freq: Two times a day (BID) | ORAL | Status: DC
  Administered 2022-04-17 – 2022-04-18 (×3): 10 mg via ORAL
  Filled 2022-04-17 (×3): qty 1

## 2022-04-17 MED ORDER — MEPERIDINE HCL 25 MG/ML IJ SOLN: 6.2500 mg | INTRAMUSCULAR | Status: DC | PRN

## 2022-04-17 MED ORDER — ACETAMINOPHEN 325 MG PO TABS: 325.0000 mg | ORAL_TABLET | Freq: Four times a day (QID) | ORAL | Status: DC | PRN

## 2022-04-17 MED ORDER — FERROUS SULFATE 325 (65 FE) MG PO TABS
325.0000 mg | ORAL_TABLET | Freq: Three times a day (TID) | ORAL | Status: DC
  Administered 2022-04-17 – 2022-04-18 (×3): 325 mg via ORAL
  Filled 2022-04-17 (×3): qty 1

## 2022-04-17 MED ORDER — FENTANYL CITRATE (PF) 250 MCG/5ML IJ SOLN
INTRAMUSCULAR | Status: DC | PRN
  Administered 2022-04-17: 50 ug via INTRAVENOUS
  Administered 2022-04-17: 20 ug via INTRAVENOUS
  Administered 2022-04-17 (×3): 50 ug via INTRAVENOUS

## 2022-04-17 MED ORDER — HYDROMORPHONE HCL 1 MG/ML IJ SOLN: 0.2500 mg | INTRAMUSCULAR | Status: DC | PRN

## 2022-04-17 MED ORDER — PHENYLEPHRINE 80 MCG/ML (10ML) SYRINGE FOR IV PUSH (FOR BLOOD PRESSURE SUPPORT)
PREFILLED_SYRINGE | INTRAVENOUS | Status: DC | PRN
  Administered 2022-04-17 (×4): 80 ug via INTRAVENOUS

## 2022-04-17 MED ORDER — BUPIVACAINE IN DEXTROSE 0.75-8.25 % IT SOLN
INTRATHECAL | Status: DC | PRN
  Administered 2022-04-17: 1.6 mL via INTRATHECAL

## 2022-04-17 MED ORDER — DEXAMETHASONE SODIUM PHOSPHATE 10 MG/ML IJ SOLN
INTRAMUSCULAR | Status: DC | PRN
  Administered 2022-04-17: 10 mg

## 2022-04-17 MED ORDER — PRONTOSAN WOUND IRRIGATION OPTIME
TOPICAL | Status: DC | PRN
  Administered 2022-04-17: 1 via TOPICAL

## 2022-04-17 MED ORDER — TRANEXAMIC ACID-NACL 1000-0.7 MG/100ML-% IV SOLN
1000.0000 mg | Freq: Once | INTRAVENOUS | Status: AC
  Administered 2022-04-17: 1000 mg via INTRAVENOUS
  Filled 2022-04-17: qty 100

## 2022-04-17 MED ORDER — PROMETHAZINE HCL 25 MG/ML IJ SOLN: 6.2500 mg | INTRAMUSCULAR | Status: DC | PRN

## 2022-04-17 MED ORDER — METOCLOPRAMIDE HCL 5 MG/ML IJ SOLN: 5.0000 mg | Freq: Three times a day (TID) | INTRAMUSCULAR | Status: DC | PRN

## 2022-04-17 MED ORDER — METOCLOPRAMIDE HCL 5 MG PO TABS: 5.0000 mg | ORAL_TABLET | Freq: Three times a day (TID) | ORAL | Status: DC | PRN

## 2022-04-17 MED ORDER — ORAL CARE MOUTH RINSE
15.0000 mL | Freq: Once | OROMUCOSAL | Status: AC
Start: 2022-04-17 — End: 2022-04-17

## 2022-04-17 MED ORDER — AMLODIPINE BESYLATE 5 MG PO TABS
5.0000 mg | ORAL_TABLET | Freq: Every day | ORAL | Status: DC
  Administered 2022-04-18: 5 mg via ORAL
  Filled 2022-04-17: qty 1

## 2022-04-17 MED ORDER — SODIUM CHLORIDE 0.9 % IV SOLN: INTRAVENOUS | Status: DC

## 2022-04-17 MED ORDER — 0.9 % SODIUM CHLORIDE (POUR BTL) OPTIME
TOPICAL | Status: DC | PRN
  Administered 2022-04-17: 1000 mL

## 2022-04-17 MED ORDER — PROPOFOL 500 MG/50ML IV EMUL
INTRAVENOUS | Status: DC | PRN
  Administered 2022-04-17: 50 ug/kg/min via INTRAVENOUS

## 2022-04-17 MED ORDER — METHOCARBAMOL 500 MG PO TABS
500.0000 mg | ORAL_TABLET | Freq: Four times a day (QID) | ORAL | Status: DC | PRN
  Administered 2022-04-17 – 2022-04-18 (×2): 500 mg via ORAL
  Filled 2022-04-17 (×2): qty 1

## 2022-04-17 MED ORDER — TRANEXAMIC ACID-NACL 1000-0.7 MG/100ML-% IV SOLN
1000.0000 mg | INTRAVENOUS | Status: AC
  Administered 2022-04-17: 1000 mg via INTRAVENOUS

## 2022-04-17 MED ORDER — ASPIRIN 81 MG PO CHEW
81.0000 mg | CHEWABLE_TABLET | Freq: Two times a day (BID) | ORAL | Status: DC
  Administered 2022-04-17 – 2022-04-18 (×2): 81 mg via ORAL
  Filled 2022-04-17 (×2): qty 1

## 2022-04-17 MED ORDER — PROPOFOL 10 MG/ML IV BOLUS
INTRAVENOUS | Status: DC | PRN
  Administered 2022-04-17: 30 mg via INTRAVENOUS
  Administered 2022-04-17: 50 mg via INTRAVENOUS
  Administered 2022-04-17: 20 mg via INTRAVENOUS
  Administered 2022-04-17: 50 mg via INTRAVENOUS
  Administered 2022-04-17: 30 mg via INTRAVENOUS
  Administered 2022-04-17: 40 mg via INTRAVENOUS
  Administered 2022-04-17: 20 mg via INTRAVENOUS

## 2022-04-17 MED ORDER — CLONIDINE HCL (ANALGESIA) 100 MCG/ML EP SOLN
EPIDURAL | Status: DC | PRN
  Administered 2022-04-17: 100 ug

## 2022-04-17 MED ORDER — TRANEXAMIC ACID 1000 MG/10ML IV SOLN
INTRAVENOUS | Status: DC | PRN
  Administered 2022-04-17: 2000 mg via TOPICAL

## 2022-04-17 MED ORDER — VANCOMYCIN HCL 1000 MG IV SOLR
INTRAVENOUS | Status: DC | PRN
  Administered 2022-04-17: 1000 mg via TOPICAL

## 2022-04-17 MED ORDER — CEFAZOLIN IN SODIUM CHLORIDE 3-0.9 GM/100ML-% IV SOLN: 3.0000 g | INTRAVENOUS | Status: DC

## 2022-04-17 MED ORDER — ROPIVACAINE HCL 5 MG/ML IJ SOLN
INTRAMUSCULAR | Status: DC | PRN
  Administered 2022-04-17 (×6): 5 mL via PERINEURAL

## 2022-04-17 MED ORDER — TRANEXAMIC ACID-NACL 1000-0.7 MG/100ML-% IV SOLN
INTRAVENOUS | Status: AC
  Filled 2022-04-17: qty 100

## 2022-04-17 MED ORDER — OXYCODONE HCL 5 MG PO TABS
10.0000 mg | ORAL_TABLET | ORAL | Status: DC | PRN
  Administered 2022-04-17 – 2022-04-18 (×3): 10 mg via ORAL
  Filled 2022-04-17 (×2): qty 2

## 2022-04-17 MED ORDER — ONDANSETRON HCL 4 MG/2ML IJ SOLN: 4.0000 mg | Freq: Four times a day (QID) | INTRAMUSCULAR | Status: DC | PRN

## 2022-04-17 MED ORDER — CHLORHEXIDINE GLUCONATE 0.12 % MT SOLN: 15.0000 mL | Freq: Once | OROMUCOSAL | Status: AC

## 2022-04-17 MED ORDER — KETOROLAC TROMETHAMINE 30 MG/ML IJ SOLN: 30.0000 mg | Freq: Once | INTRAMUSCULAR | Status: DC | PRN

## 2022-04-17 MED ORDER — POVIDONE-IODINE 10 % EX SWAB
2.0000 | Freq: Once | CUTANEOUS | Status: AC
  Administered 2022-04-17: 2 via TOPICAL

## 2022-04-17 MED ORDER — KETOROLAC TROMETHAMINE 15 MG/ML IJ SOLN
15.0000 mg | Freq: Four times a day (QID) | INTRAMUSCULAR | Status: DC
  Administered 2022-04-17 – 2022-04-18 (×3): 15 mg via INTRAVENOUS
  Filled 2022-04-17 (×3): qty 1

## 2022-04-17 MED ORDER — FENTANYL CITRATE (PF) 250 MCG/5ML IJ SOLN
INTRAMUSCULAR | Status: AC
  Filled 2022-04-17: qty 5

## 2022-04-17 MED ORDER — MIDAZOLAM HCL 2 MG/2ML IJ SOLN
INTRAMUSCULAR | Status: AC
  Filled 2022-04-17: qty 2

## 2022-04-17 MED ORDER — CEFAZOLIN SODIUM-DEXTROSE 2-3 GM-%(50ML) IV SOLR
INTRAVENOUS | Status: DC | PRN
  Administered 2022-04-17: 2 g via INTRAVENOUS

## 2022-04-17 MED ORDER — CHLORHEXIDINE GLUCONATE 0.12 % MT SOLN
OROMUCOSAL | Status: AC
  Administered 2022-04-17: 15 mL via OROMUCOSAL
  Filled 2022-04-17: qty 15

## 2022-04-17 MED ORDER — HYDROMORPHONE HCL 1 MG/ML IJ SOLN
0.5000 mg | INTRAMUSCULAR | Status: DC | PRN
  Administered 2022-04-17: 1 mg via INTRAVENOUS
  Filled 2022-04-17: qty 1

## 2022-04-17 MED ORDER — MIDAZOLAM HCL 2 MG/2ML IJ SOLN
INTRAMUSCULAR | Status: DC | PRN
  Administered 2022-04-17 (×2): 1 mg via INTRAVENOUS

## 2022-04-17 MED ORDER — OXYCODONE HCL 5 MG PO TABS
5.0000 mg | ORAL_TABLET | ORAL | Status: DC | PRN
  Filled 2022-04-17: qty 2

## 2022-04-17 MED ORDER — ONDANSETRON HCL 4 MG/2ML IJ SOLN
INTRAMUSCULAR | Status: DC | PRN
  Administered 2022-04-17: 4 mg via INTRAVENOUS

## 2022-04-17 MED ORDER — SODIUM CHLORIDE 0.9 % IR SOLN
Status: DC | PRN
  Administered 2022-04-17: 1000 mL

## 2022-04-17 MED ORDER — BUPIVACAINE-MELOXICAM ER 400-12 MG/14ML IJ SOLN
INTRAMUSCULAR | Status: AC
Start: 2022-04-17 — End: ?
  Filled 2022-04-17: qty 1

## 2022-04-17 MED ORDER — ACETAMINOPHEN 500 MG PO TABS
1000.0000 mg | ORAL_TABLET | Freq: Four times a day (QID) | ORAL | Status: DC
  Administered 2022-04-17 – 2022-04-18 (×3): 1000 mg via ORAL
  Filled 2022-04-17 (×3): qty 2

## 2022-04-17 MED ORDER — PROPOFOL 1000 MG/100ML IV EMUL
INTRAVENOUS | Status: AC
  Filled 2022-04-17: qty 100

## 2022-04-17 MED ORDER — PHENYLEPHRINE HCL-NACL 20-0.9 MG/250ML-% IV SOLN
INTRAVENOUS | Status: DC | PRN
  Administered 2022-04-17: 50 ug/min via INTRAVENOUS

## 2022-04-17 MED ORDER — ORAL CARE MOUTH RINSE: 15.0000 mL | OROMUCOSAL | Status: DC | PRN

## 2022-04-17 SURGICAL SUPPLY — 88 items
ADH SKN CLS APL DERMABOND .7 (GAUZE/BANDAGES/DRESSINGS) ×1
ALCOHOL 70% 16 OZ (MISCELLANEOUS) ×1 IMPLANT
BAG COUNTER SPONGE SURGICOUNT (BAG) IMPLANT
BAG DECANTER FOR FLEXI CONT (MISCELLANEOUS) ×1 IMPLANT
BAG SPNG CNTER NS LX DISP (BAG)
BANDAGE ESMARK 6X9 LF (GAUZE/BANDAGES/DRESSINGS) IMPLANT
BLADE SAG 18X100X1.27 (BLADE) ×1 IMPLANT
BLADE SAW SAG 90X13X1.27 (BLADE) IMPLANT
BNDG CMPR 9X6 STRL LF SNTH (GAUZE/BANDAGES/DRESSINGS)
BNDG ESMARK 6X9 LF (GAUZE/BANDAGES/DRESSINGS)
BOWL SMART MIX CTS (DISPOSABLE) ×1 IMPLANT
CEMENT BONE REFOBACIN R1X40 US (Cement) IMPLANT
CLSR STERI-STRIP ANTIMIC 1/2X4 (GAUZE/BANDAGES/DRESSINGS) ×2 IMPLANT
COMP FEM CMT PS STD 11 RT (Joint) ×1 IMPLANT
COMP PATELLAR 10X35 METAL (Joint) ×1 IMPLANT
COMP TIB KNEE PS G 0 RT (Joint) ×1 IMPLANT
COMPONENT FEM CMT PS STD 11 RT (Joint) IMPLANT
COMPONENT PATELLAR 10X35 METAL (Joint) IMPLANT
COMPONENT TIB KNEE PS G 0 RT (Joint) IMPLANT
COOLER ICEMAN CLASSIC (MISCELLANEOUS) ×1 IMPLANT
COVER SURGICAL LIGHT HANDLE (MISCELLANEOUS) ×1 IMPLANT
CUFF TOURN SGL QUICK 34 (TOURNIQUET CUFF) ×1
CUFF TOURN SGL QUICK 42 (TOURNIQUET CUFF) IMPLANT
CUFF TRNQT CYL 34X4.125X (TOURNIQUET CUFF) ×1 IMPLANT
DERMABOND ADVANCED (GAUZE/BANDAGES/DRESSINGS) ×1
DERMABOND ADVANCED .7 DNX12 (GAUZE/BANDAGES/DRESSINGS) ×1 IMPLANT
DRAPE EXTREMITY T 121X128X90 (DISPOSABLE) ×1 IMPLANT
DRAPE HALF SHEET 40X57 (DRAPES) ×1 IMPLANT
DRAPE INCISE IOBAN 66X45 STRL (DRAPES) ×1 IMPLANT
DRAPE ORTHO SPLIT 77X108 STRL (DRAPES) ×2
DRAPE POUCH INSTRU U-SHP 10X18 (DRAPES) ×1 IMPLANT
DRAPE SURG ORHT 6 SPLT 77X108 (DRAPES) ×2 IMPLANT
DRAPE U-SHAPE 47X51 STRL (DRAPES) ×2 IMPLANT
DRSG AQUACEL AG ADV 3.5X10 (GAUZE/BANDAGES/DRESSINGS) ×1 IMPLANT
DURAPREP 26ML APPLICATOR (WOUND CARE) ×3 IMPLANT
ELECT CAUTERY BLADE 6.4 (BLADE) ×1 IMPLANT
ELECT REM PT RETURN 9FT ADLT (ELECTROSURGICAL) ×1
ELECTRODE REM PT RTRN 9FT ADLT (ELECTROSURGICAL) ×1 IMPLANT
GLOVE BIOGEL PI IND STRL 7.0 (GLOVE) ×2 IMPLANT
GLOVE BIOGEL PI IND STRL 7.5 (GLOVE) ×5 IMPLANT
GLOVE BIOGEL PI INDICATOR 7.0 (GLOVE) ×1
GLOVE BIOGEL PI INDICATOR 7.5 (GLOVE) ×1
GLOVE ECLIPSE 7.0 STRL STRAW (GLOVE) ×3 IMPLANT
GLOVE SKINSENSE NS SZ7.5 (GLOVE) ×3
GLOVE SKINSENSE STRL SZ7.5 (GLOVE) ×3 IMPLANT
GLOVE SURG SYN 7.5  E (GLOVE) ×2
GLOVE SURG SYN 7.5 E (GLOVE) ×2 IMPLANT
GLOVE SURG SYN 7.5 PF PI (GLOVE) ×2 IMPLANT
GLOVE SURG UNDER LTX SZ7.5 (GLOVE) ×2 IMPLANT
GLOVE SURG UNDER POLY LF SZ7 (GLOVE) ×2 IMPLANT
GOWN STRL REIN XL XLG (GOWN DISPOSABLE) ×1 IMPLANT
GOWN STRL REUS W/ TWL LRG LVL3 (GOWN DISPOSABLE) ×1 IMPLANT
GOWN STRL REUS W/TWL LRG LVL3 (GOWN DISPOSABLE) ×1
HANDPIECE INTERPULSE COAX TIP (DISPOSABLE) ×1
HDLS TROCR DRIL PIN KNEE 75 (PIN) ×1
HOOD PEEL AWAY FLYTE STAYCOOL (MISCELLANEOUS) ×2 IMPLANT
INSERT TIBIAL PERSONA 13 RT (Insert) IMPLANT
KIT BASIN OR (CUSTOM PROCEDURE TRAY) ×1 IMPLANT
KIT TURNOVER KIT B (KITS) ×1 IMPLANT
MANIFOLD NEPTUNE II (INSTRUMENTS) ×1 IMPLANT
MARKER SKIN DUAL TIP RULER LAB (MISCELLANEOUS) ×2 IMPLANT
NDL SPNL 18GX3.5 QUINCKE PK (NEEDLE) ×1 IMPLANT
NEEDLE SPNL 18GX3.5 QUINCKE PK (NEEDLE) ×1 IMPLANT
NS IRRIG 1000ML POUR BTL (IV SOLUTION) ×1 IMPLANT
PACK TOTAL JOINT (CUSTOM PROCEDURE TRAY) ×1 IMPLANT
PAD ARMBOARD 7.5X6 YLW CONV (MISCELLANEOUS) ×2 IMPLANT
PAD COLD SHLDR WRAP-ON (PAD) ×1 IMPLANT
PIN DRILL HDLS TROCAR 75 4PK (PIN) IMPLANT
SAW OSC TIP CART 19.5X105X1.3 (SAW) ×1 IMPLANT
SCREW FEMALE HEX FIX 25X2.5 (ORTHOPEDIC DISPOSABLE SUPPLIES) IMPLANT
SET HNDPC FAN SPRY TIP SCT (DISPOSABLE) ×1 IMPLANT
SOLUTION PRONTOSAN WOUND 350ML (IRRIGATION / IRRIGATOR) IMPLANT
STAPLER VISISTAT 35W (STAPLE) IMPLANT
SUCTION FRAZIER HANDLE 10FR (MISCELLANEOUS) ×1
SUCTION TUBE FRAZIER 10FR DISP (MISCELLANEOUS) ×1 IMPLANT
SUT ETHILON 2 0 FS 18 (SUTURE) IMPLANT
SUT MNCRL AB 3-0 PS2 27 (SUTURE) IMPLANT
SUT VIC AB 0 CT1 27 (SUTURE) ×2
SUT VIC AB 0 CT1 27XBRD ANBCTR (SUTURE) ×2 IMPLANT
SUT VIC AB 1 CTX 27 (SUTURE) ×3 IMPLANT
SUT VIC AB 2-0 CT1 27 (SUTURE) ×5
SUT VIC AB 2-0 CT1 TAPERPNT 27 (SUTURE) ×4 IMPLANT
SYR 50ML LL SCALE MARK (SYRINGE) ×2 IMPLANT
TOWEL GREEN STERILE (TOWEL DISPOSABLE) ×1 IMPLANT
TOWEL GREEN STERILE FF (TOWEL DISPOSABLE) ×1 IMPLANT
TRAY CATH 16FR W/PLASTIC CATH (SET/KITS/TRAYS/PACK) IMPLANT
UNDERPAD 30X36 HEAVY ABSORB (UNDERPADS AND DIAPERS) ×1 IMPLANT
YANKAUER SUCT BULB TIP NO VENT (SUCTIONS) ×2 IMPLANT

## 2022-04-17 NOTE — Anesthesia Procedure Notes (Signed)
Date/Time: 04/17/2022 7:22 AM  Performed by: Michele Rockers, CRNAPre-anesthesia Checklist: Patient identified, Emergency Drugs available, Suction available, Timeout performed and Patient being monitored Patient Re-evaluated:Patient Re-evaluated prior to induction Oxygen Delivery Method: Simple face mask

## 2022-04-17 NOTE — Transfer of Care (Signed)
Immediate Anesthesia Transfer of Care Note  Patient: Johnny Bradley  Procedure(s) Performed: RIGHT TOTAL KNEE ARTHROPLASTY (Right: Knee)  Patient Location: PACU  Anesthesia Type:Spinal  Level of Consciousness: awake, patient cooperative and responds to stimulation  Airway & Oxygen Therapy: Patient Spontanous Breathing and Patient connected to nasal cannula oxygen  Post-op Assessment: Report given to RN and Post -op Vital signs reviewed and stable  Post vital signs: Reviewed and stable  Last Vitals:  Vitals Value Taken Time  BP 96/65 04/17/22 0947  Temp    Pulse 69 04/17/22 0948  Resp 19 04/17/22 0948  SpO2 92 % 04/17/22 0948  Vitals shown include unvalidated device data.  Last Pain:  Vitals:   04/17/22 0559  TempSrc:   PainSc: 0-No pain         Complications: No notable events documented.

## 2022-04-17 NOTE — TOC Initial Note (Signed)
Transition of Care [facility]) - Initial/Assessment Note    Patient Details  Name: Johnny Bradley MRN: [id number] Date of Birth: 08-04-1961  Transition of Care [facility]) CM/SW Contact:    Ella Bodo, RN Phone Number: 04/17/2022, 4:42 PM  Clinical Narrative:                 Pt is a 61 y/o male s/p R TKA. PMH includes HTN. Prior to admission, patient independent and living at home with spouse.  Home health PT and OT has been arranged preoperatively with [facility] home [facility]; recommended DME to be given to patient from [facility] staff.  Anticipate discharge in a.m.  Expected Discharge Plan: [place] Barriers to Discharge: Continued Medical Work up   Patient Goals and CMS Choice   CMS Medicare.gov Compare Post Acute Care list provided to:: Patient Choice offered to / list presented to : Patient  Expected Discharge Plan and Services Expected Discharge Plan: [place]   Discharge Planning Services: CM Consult Post Acute Care Choice: [place] arrangements for the past 2 months: Single Family Home                   DME Agency: Other - Comment       HH Arranged: PT, OT HH Agency: [facility]     Representative spoke with at [place]: Claiborne Billings [place]/ arranged PTA  Prior Living Arrangements/Services Living arrangements for the past 2 months: Single Family Home Lives with:: Spouse Patient language and need for interpreter reviewed:: Yes Do you feel safe going back to the place where you live?: Yes      Need for Family Participation in Patient Care: Yes (Comment) Care giver support system in place?: Yes (comment)   Criminal Activity/Legal Involvement Pertinent to Current Situation/Hospitalization: No - Comment as needed               Emotional Assessment Appearance:: Appears stated age Attitude/Demeanor/Rapport: Engaged Affect (typically observed): Accepting Orientation: : Oriented to Self, Oriented to Place, Oriented to   Time, Oriented to Situation      Admission diagnosis:  Status post total left knee replacement [Z96.652] Patient Active Problem List   Diagnosis Date Noted   Status post total left knee replacement 04/17/2022   Primary osteoarthritis of right knee 11/15/2021   Right shoulder pain 11/01/2021   Abdominal discomfort 11/01/2021   Bilateral knee pain 09/15/2020   OSA (obstructive sleep apnea) 02/03/2020   Chronic knee pain 02/03/2020   Family history of angiosarcoma 04/15/2019   Prediabetes 04/14/2019   HLD (hyperlipidemia) 10/02/2018   Subcutaneous nodule of right hand 10/02/2018   Skin lesion 05/29/2018   Hypertension 12/03/2017   Obesity 05/25/2017   Right low back pain 12/19/2014   PCP:  [name], MD Pharmacy:   [facility] [id number] - [address], [place] [address] [place] [address] Phone: [phone] Fax: [phone]     Social Determinants of Health (SDOH) Interventions    Readmission Risk Interventions     No data to display         Reinaldo Raddle, RN, BSN  Trauma/Neuro ICU Case Manager [phone]

## 2022-04-17 NOTE — Anesthesia Procedure Notes (Signed)
Spinal  Patient location during procedure: OR Start time: 04/17/2022 7:30 AM End time: 04/17/2022 7:40 AM Reason for block: surgical anesthesia Staffing Performed: anesthesiologist  Anesthesiologist: Lyn Hollingshead, MD Performed by: Lyn Hollingshead, MD Authorized by: Lyn Hollingshead, MD   Preanesthetic Checklist Completed: patient identified, IV checked, site marked, risks and benefits discussed, surgical consent, monitors and equipment checked, pre-op evaluation and timeout performed Spinal Block Patient position: sitting Prep: DuraPrep Patient monitoring: heart rate, cardiac monitor, continuous pulse ox and blood pressure Approach: midline Location: L3-4 Injection technique: single-shot Needle Needle type: Sprotte  Needle gauge: 24 G Needle length: 9 cm Needle insertion depth: 7 cm Assessment Sensory level: T8 Events: CSF return

## 2022-04-17 NOTE — Op Note (Signed)
Total Knee Arthroplasty Procedure Note  Preoperative diagnosis: Right knee osteoarthritis  Postoperative diagnosis:same  Operative findings: Complete loss of cartilage from all 3 compartments Synovitis  Operative procedure: Right total knee arthroplasty. CPT [phone]  Surgeon: N. Eduard Roux, MD  Assist: Madalyn Rob, PA-C; necessary for the timely completion of procedure and due to complexity of procedure.  Anesthesia: Spinal, regional  Tourniquet time: see anesthesia record  Implants used: Zimmer persona pressfit Femur: CR 11 Tibia: G Patella: 35 mm Polyethylene: 13 mm, MC  Indication: Johnny Bradley is a 61 y.o. year old male with a history of knee pain. Having failed conservative management, the patient elected to proceed with a total knee arthroplasty.  We have reviewed the risk and benefits of the surgery and they elected to proceed after voicing understanding.  Procedure:  After informed consent was obtained and understanding of the risk were voiced including but not limited to bleeding, infection, damage to surrounding structures including nerves and vessels, blood clots, leg length inequality and the failure to achieve desired results, the operative extremity was marked with verbal confirmation of the patient in the holding area.   The patient was then brought to the operating room and transported to the operating room table in the supine position.  A tourniquet was applied to the operative extremity around the upper thigh. The operative limb was then prepped and draped in the usual sterile fashion and preoperative antibiotics were administered.  A time out was performed prior to the start of surgery confirming the correct extremity, preoperative antibiotic administration, as well as team members, implants and instruments available for the case. Correct surgical site was also confirmed with preoperative radiographs. The limb was then elevated for exsanguination and  the tourniquet was inflated. A midline incision was made and a standard medial parapatellar approach was performed.  The patella was prepared and sized to a 35 mm.  A cover was placed on the patella for protection from retractors.  We then turned our attention to the femur. Posterior cruciate ligament was sacrificed. Start site was drilled in the femur and the intramedullary distal femoral cutting guide was placed, set at 5 degrees valgus, taking 10 mm of distal resection. The distal cut was made. Osteophytes were then removed.   Next, the proximal tibial cutting guide was placed with appropriate slope, varus/valgus alignment and depth of resection. The proximal tibial cut was made taking 2 mm off the low side. Gap blocks were then used to assess the extension gap and alignment, and appropriate soft tissue releases were performed. Attention was turned back to the femur, which was sized using the sizing guide to a size 11. Appropriate rotation of the femoral component was determined using epicondylar axis, Whiteside's line, and assessing the flexion gap under ligament tension. The appropriate size 4-in-1 cutting block was placed and cuts were made.  Posterior femoral osteophytes and uncapped bone were then removed with the curved osteotome.  Trial components were placed, and stability was checked in full extension, mid-flexion, and deep flexion. Proper tibial rotation was determined and marked.  The patella tracked well without a lateral release. Trial components were then removed and tibial preparation performed.  The tibia was sized for a size G component.  The bony surfaces were irrigated with a pulse lavage and then dried. Final components were placed.  The stability of the construct was re-evaluated throughout a range of motion and found to be acceptable. The trial liner was removed, the knee was copiously irrigated,  and the knee was re-evaluated for any excess bone debris. The real polyethylene liner, 13 mm  thick, was inserted and checked to ensure the locking mechanism had engaged appropriately. The tourniquet was deflated and hemostasis was achieved. The wound was irrigated with normal saline.  One gram of vancomycin powder was placed in the surgical bed.  Topical 0.25% bupivacaine and meloxicam was placed in the joint for postoperative pain.  Capsular closure was performed with a #1 vicryl, subcutaneous fat closed with a 0 vicryl suture, then subcutaneous tissue closed with interrupted 2.0 vicryl suture. The skin was then closed with a 2.0 nylon and dermabond. A sterile dressing was applied.  The patient was awakened in the operating room and taken to recovery in stable condition. All sponge, needle, and instrument counts were correct at the end of the case.  Tawanna Cooler was necessary for opening, closing, retracting, limb positioning and overall facilitation and completion of the surgery.  Position: supine  Complications: none.  Time Out: performed   Drains/Packing: none  Estimated blood loss: minimal  Returned to Recovery Room: in good condition.   Antibiotics: yes   Mechanical VTE (DVT) Prophylaxis: sequential compression devices, TED thigh-high  Chemical VTE (DVT) Prophylaxis: aspirin  Fluid Replacement  Crystalloid: see anesthesia record Blood: none  FFP: none   Specimens Removed: 1 to pathology   Sponge and Instrument Count Correct? yes   PACU: portable radiograph - knee AP and Lateral   Plan/RTC: Return in 2 weeks for wound check.   Weight Bearing/Load Lower Extremity: full   Implant Name Type Inv. Item Serial No. Manufacturer Lot No. LRB No. Used Action  COMP FEM CMT PS STD 11 RT - [id number] Joint COMP FEM CMT PS STD 11 RT  ZIMMER RECON(ORTH,TRAU,BIO,SG) [id number] Right 1 Implanted  INSERT TIBIAL PERSONA 13 RT - [id number] Insert INSERT TIBIAL PERSONA 13 RT  ZIMMER RECON(ORTH,TRAU,BIO,SG) [id number] Right 1 Implanted  COMP PATELLAR 10X35 METAL - [id number] Joint COMP  PATELLAR 10X35 METAL  ZIMMER RECON(ORTH,TRAU,BIO,SG) [id number] Right 1 Implanted  COMP TIB KNEE PS G 0 RT - [id number] Joint COMP TIB KNEE PS G 0 RT  ZIMMER RECON(ORTH,TRAU,BIO,SG) [id number] Right 1 Implanted    N. Eduard Roux, MD [facility] 9:07 AM

## 2022-04-17 NOTE — H&P (Signed)
PREOPERATIVE H&P  Chief Complaint: right knee degenerative joint disease  HPI: Johnny Bradley is a 61 y.o. male who presents for surgical treatment of right knee degenerative joint disease.  He denies any changes in medical history.  Past Medical History:  Diagnosis Date   Arthritis    Back pain    Hyperlipidemia    Hypertension    Shoulder pain    Left   Sleep apnea    Past Surgical History:  Procedure Laterality Date   KNEE ARTHROSCOPY Right    MCL Torn   PILONIDAL CYST EXCISION  1980   WISDOM TOOTH EXTRACTION     Social History   Socioeconomic History   Marital status: Married    Spouse name: Not on file   Number of children: Not on file   Years of education: Not on file   Highest education level: Not on file  Occupational History   Not on file  Tobacco Use   Smoking status: Never   Smokeless tobacco: Never  Vaping Use   Vaping Use: Never used  Substance and Sexual Activity   Alcohol use: Yes    Alcohol/week: 12.0 standard drinks of alcohol    Types: 12 Cans of beer per week    Comment: On weekends- [employer] [address]    Drug use: No   Sexual activity: Yes    Partners: Female    Comment: Wife  Other Topics Concern   Not on file  Social History Narrative   Computer Dept. Next door- manages paperwork    Lives at home with wife and daughter & fiance    Son who owns Medical sales representative. And has a son 40 yo    Granddaughter 8 mos from daughter    Pets: 1 cat 2 dogs    Right handed    Caffeine- none   Masters degree    Enjoys- outdoor activities, festivals    Social Determinants of Radio broadcast assistant Strain: Not on file  Food Insecurity: Not on file  Transportation Needs: Not on file  Physical Activity: Not on file  Stress: Not on file  Social Connections: Not on file   Family History  Problem Relation Age of Onset   Heart disease Father    Stroke Paternal Grandmother    No Known Allergies Prior to Admission medications   Medication Sig Start  Date End Date Taking? Authorizing Provider  acetaminophen (TYLENOL) 500 MG tablet Take 1,000 mg by mouth every 8 (eight) hours as needed for moderate pain.   Yes [provider]  amLODipine (NORVASC) 5 MG tablet TAKE ONE TABLET BY MOUTH DAILY; **MUST CALL MD FOR APPOINTMENT FOR FUTURE REFILLS ! 02/27/22  Yes [name], MD  Calcium Carb-Cholecalciferol (CALTRATE 600+D3 PO) Take 1 tablet by mouth daily.   Yes [provider]  Multiple Vitamins-Minerals (CENTRUM SILVER ADULT 50+ PO) Take 1 tablet by mouth daily.   Yes [provider]  naproxen sodium (ALEVE) 220 MG tablet Take 440 mg by mouth daily as needed (pain).   Yes [provider]  Omega-3 Fatty Acids (FISH OIL) 1000 MG CAPS Take 1,000 mg by mouth daily.   Yes [provider]  pravastatin (PRAVACHOL) 20 MG tablet TAKE 1 TABLET BY MOUTH ON MONDAY, WEDNESDAY, AND FRIDAY AS DIRECTED BY DOCTOR 03/01/22  Yes [name], MD  aspirin EC 81 MG tablet Take 1 tablet (81 mg total) by mouth 2 (two) times daily for 84 doses. To be taken  after surgery to prevent blood clots 04/13/22 05/25/22  Aundra Dubin, PA-C  docusate sodium (COLACE) 100 MG capsule Take 1 capsule (100 mg total) by mouth daily as needed. 04/13/22 04/13/23  Aundra Dubin, PA-C  methocarbamol (ROBAXIN-750) 750 MG tablet Take 1 tablet (750 mg total) by mouth 2 (two) times daily as needed. 04/13/22   Aundra Dubin, PA-C  ondansetron (ZOFRAN) 4 MG tablet Take 1 tablet (4 mg total) by mouth every 8 (eight) hours as needed for nausea or vomiting. 04/13/22   Aundra Dubin, PA-C  oxyCODONE-acetaminophen (PERCOCET) 5-325 MG tablet Take 1-2 tablets by mouth every 6 (six) hours as needed. To be taken after surgery 04/13/22   Aundra Dubin, PA-C     Positive ROS: All other systems have been reviewed and were otherwise negative with the exception of those mentioned in the HPI and as above.  Physical Exam: General: Alert, no acute  distress Cardiovascular: No pedal edema Respiratory: No cyanosis, no use of accessory musculature GI: abdomen soft Skin: No lesions in the area of chief complaint Neurologic: Sensation intact distally Psychiatric: Patient is competent for consent with normal mood and affect Lymphatic: no lymphedema  MUSCULOSKELETAL: exam stable  Assessment: right knee degenerative joint disease  Plan: Plan for Procedure(s): RIGHT TOTAL KNEE ARTHROPLASTY  The risks benefits and alternatives were discussed with the patient including but not limited to the risks of nonoperative treatment, versus surgical intervention including infection, bleeding, nerve injury,  blood clots, cardiopulmonary complications, morbidity, mortality, among others, and they were willing to proceed.   Eduard Roux, MD 04/17/2022 5:52 AM

## 2022-04-17 NOTE — Evaluation (Signed)
Physical Therapy Evaluation Patient Details Name: Diane Mochizuki MRN: [id number] DOB: Jul 30, 1961 Today's Date: 04/17/2022  History of Present Illness  Pt is a 61 y/o male s/p R TKA. PMH includes HTN.  Clinical Impression  Pt admitted secondary to problem above with deficits below. Pt tolerated gait well this session. Required min guard A for mobility tasks. Reviewed knee precautions. Reports wife can assist at home as needed. Will continue to follow acutely.        Recommendations for follow up therapy are one component of a multi-disciplinary discharge planning process, led by the attending physician.  Recommendations may be updated based on patient status, additional functional criteria and insurance authorization.  Follow Up Recommendations Follow physician's recommendations for discharge plan and follow up therapies      Assistance Recommended at Discharge Intermittent Supervision/Assistance  Patient can return home with the following  Assist for transportation;Help with stairs or ramp for entrance;Assistance with cooking/housework    Equipment Recommendations None recommended by PT  Recommendations for Other Services       Functional Status Assessment Patient has had a recent decline in their functional status and demonstrates the ability to make significant improvements in function in a reasonable and predictable amount of time.     Precautions / Restrictions Precautions Precautions: Knee Precaution Booklet Issued: No Precaution Comments: Verbally reviewed knee precautions. Restrictions Weight Bearing Restrictions: Yes RLE Weight Bearing: Weight bearing as tolerated      Mobility  Bed Mobility Overal bed mobility: Needs Assistance Bed Mobility: Supine to Sit     Supine to sit: Supervision     General bed mobility comments: Supervision for safety    Transfers Overall transfer level: Needs assistance Equipment used: Rolling walker (2 wheels) Transfers: Sit  to/from Stand Sit to Stand: Min guard           General transfer comment: Min guard for safety    Ambulation/Gait Ambulation/Gait assistance: Min guard Gait Distance (Feet): 100 Feet Assistive device: Rolling walker (2 wheels) Gait Pattern/deviations: Step-to pattern, Step-through pattern, Decreased stance time - right, Antalgic Gait velocity: Decreased     General Gait Details: Slow, antalgic gait. Min guard for safety.  Stairs            Wheelchair Mobility    Modified Rankin (Stroke Patients Only)       Balance Overall balance assessment: Needs assistance Sitting-balance support: No upper extremity supported, Feet supported Sitting balance-Leahy Scale: Fair     Standing balance support: Bilateral upper extremity supported Standing balance-Leahy Scale: Poor Standing balance comment: Reliant on BUE support                             Pertinent Vitals/Pain Pain Assessment Pain Assessment: Faces Faces Pain Scale: Hurts little more Pain Location: R knee Pain Descriptors / Indicators: Grimacing, Guarding Pain Intervention(s): Limited activity within patient's tolerance, Monitored during session, Repositioned    Home Living Family/patient expects to be discharged to:: Private residence Living Arrangements: Spouse/significant other Available Help at Discharge: Family Type of Home: House Home Access: Stairs to enter Entrance Stairs-Rails: None Entrance Stairs-Number of Steps: 2 Alternate Level Stairs-Number of Steps: flight Home Layout: Two level Home Equipment: Conservation officer, nature (2 wheels);Cane - single point      Prior Function Prior Level of Function : Independent/Modified Independent             Mobility Comments: Uses cane when pain flares  Hand Dominance        Extremity/Trunk Assessment   Upper Extremity Assessment Upper Extremity Assessment: Overall WFL for tasks assessed    Lower Extremity Assessment Lower  Extremity Assessment: RLE deficits/detail RLE Deficits / Details: Deficits consistent with post op pain and weakness.    Cervical / Trunk Assessment Cervical / Trunk Assessment: Normal  Communication   Communication: No difficulties  Cognition Arousal/Alertness: Awake/alert Behavior During Therapy: WFL for tasks assessed/performed Overall Cognitive Status: Within Functional Limits for tasks assessed                                          General Comments General comments (skin integrity, edema, etc.): Pt's wife present    Exercises     Assessment/Plan    PT Assessment Patient needs continued PT services  PT Problem List Decreased strength;Decreased activity tolerance;Decreased balance;Decreased mobility;Decreased knowledge of use of DME;Pain       PT Treatment Interventions DME instruction;Gait training;Stair training;Functional mobility training;Therapeutic activities;Therapeutic exercise;Balance training;Patient/family education    PT Goals (Current goals can be found in the Care Plan section)  Acute Rehab PT Goals Patient Stated Goal: to go home PT Goal Formulation: With patient Time For Goal Achievement: 05/01/22 Potential to Achieve Goals: Good    Frequency 7X/week     Co-evaluation               AM-PAC PT "6 Clicks" Mobility  Outcome Measure Help needed turning from your back to your side while in a flat bed without using bedrails?: None Help needed moving from lying on your back to sitting on the side of a flat bed without using bedrails?: A Little Help needed moving to and from a bed to a chair (including a wheelchair)?: A Little Help needed standing up from a chair using your arms (e.g., wheelchair or bedside chair)?: A Little Help needed to walk in hospital room?: A Little Help needed climbing 3-5 steps with a railing? : A Little 6 Click Score: 19    End of Session Equipment Utilized During Treatment: Gait belt Activity  Tolerance: Patient tolerated treatment well Patient left: in bed;with call bell/phone within reach;with family/visitor present (sitting EOB) Nurse Communication: Mobility status;Other (comment) (pt wanting catheter out) PT Visit Diagnosis: Other abnormalities of gait and mobility (R26.89);Difficulty in walking, not elsewhere classified (R26.2);Pain Pain - Right/Left: Right Pain - part of body: Knee    Time: 8657-8469 PT Time Calculation (min) (ACUTE ONLY): 15 min   Charges:   PT Evaluation $PT Eval Low Complexity: 1 Low          Reuel Derby, PT, DPT  Acute Rehabilitation Services  Office: [phone]   Rudean Hitt 04/17/2022, 2:02 PM

## 2022-04-17 NOTE — Progress Notes (Signed)
Pt stated he would like to wear [place] tonight instead of CPAP.

## 2022-04-17 NOTE — Discharge Instructions (Signed)

## 2022-04-17 NOTE — Anesthesia Postprocedure Evaluation (Signed)
Anesthesia Post Note  Patient: Johnny Bradley  Procedure(s) Performed: RIGHT TOTAL KNEE ARTHROPLASTY (Right: Knee)     Patient location during evaluation: PACU Anesthesia Type: Spinal Level of consciousness: awake Pain management: pain level controlled Vital Signs Assessment: post-procedure vital signs reviewed and stable Respiratory status: spontaneous breathing Cardiovascular status: stable Postop Assessment: no headache, no backache, spinal receding, patient able to bend at knees and no apparent nausea or vomiting Anesthetic complications: no   No notable events documented.  Last Vitals:  Vitals:   04/17/22 0945 04/17/22 1000  BP: 96/65 (!) 89/64  Pulse: 65 60  Resp: 12 17  Temp: 36.4 C   SpO2: 93% 96%    Last Pain:  Vitals:   04/17/22 0945  TempSrc:   PainSc: 0-No pain                 Huston Foley

## 2022-04-17 NOTE — Anesthesia Procedure Notes (Signed)
Anesthesia Regional Block: Adductor canal block   Pre-Anesthetic Checklist: , timeout performed,  Correct Patient, Correct Site, Correct Laterality,  Correct Procedure, Correct Position, site marked,  Risks and benefits discussed,  Surgical consent,  Pre-op evaluation,  At surgeon's request and post-op pain management  Laterality: Lower and Right  Prep: chloraprep       Needles:  Injection technique: Single-shot  Needle Type: Echogenic Stimulator Needle     Needle Length: 9cm  Needle Gauge: 20   Needle insertion depth: 3 cm   Additional Needles:   Procedures:,,,, ultrasound used (permanent image in chart),,    Narrative:  Start time: 04/17/2022 7:13 AM End time: 04/17/2022 7:20 AM Injection made incrementally with aspirations every 5 mL.  Performed by: Personally  Anesthesiologist: Lyn Hollingshead, MD

## 2022-04-18 ENCOUNTER — Other Ambulatory Visit: Payer: Self-pay | Admitting: Physician Assistant

## 2022-04-18 DIAGNOSIS — M1711 Unilateral primary osteoarthritis, right knee: Secondary | ICD-10-CM | POA: Diagnosis not present

## 2022-04-18 LAB — CBC
HCT: 36.5 % — ABNORMAL LOW (ref 39.0–52.0)
Hemoglobin: 12.6 g/dL — ABNORMAL LOW (ref 13.0–17.0)
MCH: 32 pg (ref 26.0–34.0)
MCHC: 34.5 g/dL (ref 30.0–36.0)
MCV: 92.6 fL (ref 80.0–100.0)
Platelets: 144 10*3/uL — ABNORMAL LOW (ref 150–400)
RBC: 3.94 MIL/uL — ABNORMAL LOW (ref 4.22–5.81)
RDW: 11.8 % (ref 11.5–15.5)
WBC: 12.3 10*3/uL — ABNORMAL HIGH (ref 4.0–10.5)
nRBC: 0 % (ref 0.0–0.2)

## 2022-04-18 NOTE — Progress Notes (Signed)
Physical Therapy Treatment Patient Details Name: Johnny Bradley MRN: [id number] DOB: November 03, 1960 Today's Date: 04/18/2022   History of Present Illness Pt is a 61 y/o male s/p R TKA. PMH includes HTN.    PT Comments    Patient progressing well towards PT goals. Session focused on gait training, stair training and HEP. Tolerated stair training with supervision for safety and cues for technique; wife present. Worked on proper gait mechanics with use of RW for gait training as pt impulsive and needs to slow down for safety. Provided and reviewed HEP for home. Knee AROM ~1-80 degrees per visual observation. Discussed car transfer and importance of knee positioning, there ex, cryotherapy etc. Pt safe to d/c from a mobility stand point with support of wife. Will follow if still in the hospital.   Recommendations for follow up therapy are one component of a multi-disciplinary discharge planning process, led by the attending physician.  Recommendations may be updated based on patient status, additional functional criteria and insurance authorization.  Follow Up Recommendations  Follow physician's recommendations for discharge plan and follow up therapies     Assistance Recommended at Discharge PRN  Patient can return home with the following Assist for transportation;Help with stairs or ramp for entrance;Assistance with cooking/housework   Equipment Recommendations  None recommended by PT    Recommendations for Other Services       Precautions / Restrictions Precautions Precautions: Knee Precaution Booklet Issued: Yes (comment) Precaution Comments: Verbally reviewed knee precautions and provided HEP handout Restrictions Weight Bearing Restrictions: Yes RLE Weight Bearing: Weight bearing as tolerated     Mobility  Bed Mobility Overal bed mobility: Modified Independent Bed Mobility: Supine to Sit, Sit to Supine     Supine to sit: Modified independent (Device/Increase time) Sit to  supine: Modified independent (Device/Increase time)   General bed mobility comments: HOB flat, no use of rails. Performed x2.    Transfers Overall transfer level: Needs assistance Equipment used: Rolling walker (2 wheels) Transfers: Sit to/from Stand Sit to Stand: Modified independent (Device/Increase time)           General transfer comment: Stood from EOB x2, no difficulties. impulsive with cues to have RW in front of him for safety.    Ambulation/Gait Ambulation/Gait assistance: Supervision Gait Distance (Feet): 250 Feet Assistive device: Rolling walker (2 wheels) Gait Pattern/deviations: Step-through pattern, Decreased stance time - right, Antalgic, Knee flexed in stance - right Gait velocity: Decreased     General Gait Details: Antalgic gait with cues for right knee extension during stance phase, a few standing rest breaks.   Stairs Stairs: Yes Stairs assistance: Supervision Stair Management: One rail Left, Step to pattern, Forwards Number of Stairs: 13 General stair comments: Cues for up with good/down with bad and to slow down. Impulsive. no buckling noted.   Wheelchair Mobility    Modified Rankin (Stroke Patients Only)       Balance Overall balance assessment: Needs assistance Sitting-balance support: Feet supported, No upper extremity supported Sitting balance-Leahy Scale: Good     Standing balance support: During functional activity Standing balance-Leahy Scale: Fair Standing balance comment: Reliant on BUE support for dynamic tasks.                            Cognition Arousal/Alertness: Awake/alert Behavior During Therapy: Impulsive Overall Cognitive Status: Within Functional Limits for tasks assessed  Exercises Total Joint Exercises Ankle Circles/Pumps: AROM, Both, 10 reps, Supine Quad Sets: AROM, 10 reps, Supine, Both (3 sec hold) Heel Slides: AROM, Right, 10 reps,  Supine Hip ABduction/ADduction: AROM, Right, 10 reps, Supine Long Arc Quad: AROM, Right, 10 reps, Seated Goniometric ROM: ~1-80 degrees knee AROM per visual observation    General Comments General comments (skin integrity, edema, etc.): Wife present during session.      Pertinent Vitals/Pain Pain Assessment Pain Assessment: 0-10 Pain Score: 4  Pain Location: Rt knee Pain Descriptors / Indicators: Sore, Discomfort Pain Intervention(s): Monitored during session, Repositioned    Home Living                          Prior Function            PT Goals (current goals can now be found in the care plan section) Progress towards PT goals: Progressing toward goals    Frequency    7X/week      PT Plan Current plan remains appropriate    Co-evaluation              AM-PAC PT "6 Clicks" Mobility   Outcome Measure  Help needed turning from your back to your side while in a flat bed without using bedrails?: None Help needed moving from lying on your back to sitting on the side of a flat bed without using bedrails?: None Help needed moving to and from a bed to a chair (including a wheelchair)?: A Little Help needed standing up from a chair using your arms (e.g., wheelchair or bedside chair)?: A Little Help needed to walk in hospital room?: A Little Help needed climbing 3-5 steps with a railing? : A Little 6 Click Score: 20    End of Session Equipment Utilized During Treatment: Gait belt Activity Tolerance: Patient tolerated treatment well Patient left: in bed;with call bell/phone within reach;with family/visitor present Nurse Communication: Mobility status PT Visit Diagnosis: Other abnormalities of gait and mobility (R26.89);Difficulty in walking, not elsewhere classified (R26.2);Pain Pain - Right/Left: Right Pain - part of body: Knee     Time: 8127-5170 PT Time Calculation (min) (ACUTE ONLY): 32 min  Charges:  $Gait Training: 8-22 mins $Therapeutic  Exercise: 8-22 mins                     Marisa Severin, PT, DPT Acute Rehabilitation Services Secure chat preferred Office [place] 04/18/2022, 8:15 AM

## 2022-04-18 NOTE — Progress Notes (Signed)
Subjective: 1 Day Post-Op Procedure(s) (LRB): RIGHT TOTAL KNEE ARTHROPLASTY (Right) Patient reports pain as mild.    Objective: Vital signs in last 24 hours: Temp:  [97.6 F (36.4 C)-98 F (36.7 C)] 97.7 F (36.5 C) (08/29 0554) Pulse Rate:  [58-79] 79 (08/29 0554) Resp:  [12-20] 20 (08/29 0554) BP: (89-114)/(61-77) 114/67 (08/29 0554) SpO2:  [93 %-100 %] 96 % (08/29 0554)  Intake/Output from previous day: 08/28 0701 - 08/29 0700 In: 1743.8 [I.V.:1393.8; IV Piggyback:350] Out: 3762 [Urine:1700; Blood:75] Intake/Output this shift: No intake/output data recorded.  Recent Labs    04/18/22 0550  HGB 12.6*   Recent Labs    04/18/22 0550  WBC 12.3*  RBC 3.94*  HCT 36.5*  PLT 144*   No results for input(s): "NA", "K", "CL", "CO2", "BUN", "CREATININE", "GLUCOSE", "CALCIUM" in the last 72 hours. No results for input(s): "LABPT", "INR" in the last 72 hours.  Neurologically intact Neurovascular intact Sensation intact distally Intact pulses distally Dorsiflexion/Plantar flexion intact Incision: dressing C/D/I No cellulitis present Compartment soft   Assessment/Plan: 1 Day Post-Op Procedure(s) (LRB): RIGHT TOTAL KNEE ARTHROPLASTY (Right) Advance diet Up with therapy D/C IV fluids Discharge home with home health WBAT RLE ABLA- mild and stable F/u with Dr. Erlinda Hong in two weeks   Anticipated LOS equal to or greater than 2 midnights due to - Age 63 and older with one or more of the following:  - Obesity  - Expected need for hospital services (PT, OT, Nursing) required for safe  discharge  - Anticipated need for postoperative skilled nursing care or inpatient rehab  - Active co-morbidities: None OR   - Unanticipated findings during/Post Surgery: None  - Patient is a high risk of re-admission due to: None   Aundra Dubin 04/18/2022, 7:51 AM

## 2022-04-18 NOTE — Discharge Summary (Signed)
Patient ID: Johnny Bradley MRN: [id number] DOB/AGE: July 08, 1961 61 y.o.  Admit date: 04/17/2022 Discharge date: 04/18/2022  Admission Diagnoses:  Principal Problem:   Primary osteoarthritis of right knee Active Problems:   Status post total left knee replacement   Discharge Diagnoses:  Same  Past Medical History:  Diagnosis Date   Arthritis    Back pain    Hyperlipidemia    Hypertension    Shoulder pain    Left   Sleep apnea     Surgeries: Procedure(s): RIGHT TOTAL KNEE ARTHROPLASTY on 04/17/2022   Consultants:   Discharged Condition: Improved  Hospital Course: Johnny Bradley is an 61 y.o. male who was admitted 04/17/2022 for operative treatment ofPrimary osteoarthritis of right knee. Patient has severe unremitting pain that affects sleep, daily activities, and work/hobbies. After pre-op clearance the patient was taken to the operating room on 04/17/2022 and underwent  Procedure(s): RIGHT TOTAL KNEE ARTHROPLASTY.    Patient was given perioperative antibiotics:  Anti-infectives (From admission, onward)    Start     Dose/Rate Route Frequency Ordered Stop   04/17/22 1330  ceFAZolin (ANCEF) IVPB 2g/100 mL premix        2 g 200 mL/hr over 30 Minutes Intravenous Every 6 hours 04/17/22 1056 04/17/22 1808   04/17/22 0813  vancomycin (VANCOCIN) powder  Status:  Discontinued          As needed 04/17/22 0813 04/17/22 0943   04/17/22 0700  ceFAZolin (ANCEF) IVPB 3g/100 mL premix  Status:  Discontinued        3 g 200 mL/hr over 30 Minutes Intravenous On call to O.R. 04/17/22 0542 04/17/22 1056   04/17/22 0545  ceFAZolin (ANCEF) 2-4 GM/100ML-% IVPB       Note to Pharmacy: Demetrios Isaacs A: cabinet override      04/17/22 0545 04/17/22 1759        Patient was given sequential compression devices, early ambulation, and chemoprophylaxis to prevent DVT.  Patient benefited maximally from hospital stay and there were no complications.    Recent vital signs: Patient Vitals for the  past 24 hrs:  BP Temp Temp src Pulse Resp SpO2  04/18/22 0554 114/67 97.7 F (36.5 C) Oral 79 20 96 %  04/17/22 1933 108/75 97.8 F (36.6 C) Oral 72 20 96 %  04/17/22 1649 105/77 97.8 F (36.6 C) -- 72 18 100 %  04/17/22 1100 103/70 -- -- (!) 59 16 98 %  04/17/22 1030 100/69 98 F (36.7 C) -- 60 14 97 %  04/17/22 1015 91/61 -- -- (!) 58 13 95 %  04/17/22 1000 (!) 89/64 -- -- 60 17 96 %  04/17/22 0945 96/65 97.6 F (36.4 C) -- 65 12 93 %     Recent laboratory studies:  Recent Labs    04/18/22 0550  WBC 12.3*  HGB 12.6*  HCT 36.5*  PLT 144*     Discharge Medications:   Allergies as of 04/18/2022   No Known Allergies      Medication List     STOP taking these medications    acetaminophen 500 MG tablet Commonly known as: TYLENOL   Fish Oil 1000 MG Caps   naproxen sodium 220 MG tablet Commonly known as: ALEVE       TAKE these medications    amLODipine 5 MG tablet Commonly known as: NORVASC TAKE ONE TABLET BY MOUTH DAILY; **MUST CALL MD FOR APPOINTMENT FOR FUTURE REFILLS !   aspirin EC 81 MG tablet Take 1 tablet (81  mg total) by mouth 2 (two) times daily for 84 doses. To be taken after surgery to prevent blood clots   CALTRATE 600+D3 PO Take 1 tablet by mouth daily.   CENTRUM SILVER ADULT 50+ PO Take 1 tablet by mouth daily.   docusate sodium 100 MG capsule Commonly known as: Colace Take 1 capsule (100 mg total) by mouth daily as needed.   methocarbamol 750 MG tablet Commonly known as: Robaxin-750 Take 1 tablet (750 mg total) by mouth 2 (two) times daily as needed.   ondansetron 4 MG tablet Commonly known as: Zofran Take 1 tablet (4 mg total) by mouth every 8 (eight) hours as needed for nausea or vomiting.   oxyCODONE-acetaminophen 5-325 MG tablet Commonly known as: Percocet Take 1-2 tablets by mouth every 6 (six) hours as needed. To be taken after surgery   pravastatin 20 MG tablet Commonly known as: PRAVACHOL TAKE 1 TABLET BY MOUTH ON  MONDAY, WEDNESDAY, AND FRIDAY AS DIRECTED BY DOCTOR               Durable Medical Equipment  (From admission, onward)           Start     Ordered   04/17/22 1057  DME Walker rolling  Once       Question Answer Comment  Walker: With 5 Inch Wheels   Patient needs a walker to treat with the following condition Status post left partial knee replacement      04/17/22 1056   04/17/22 1057  DME 3 n 1  Once        04/17/22 1056   04/17/22 1057  DME Bedside commode  Once       Question:  Patient needs a bedside commode to treat with the following condition  Answer:  Status post left partial knee replacement   04/17/22 1056            Diagnostic Studies: DG Knee Right Port  Result Date: 04/17/2022 CLINICAL DATA:  Pain, right knee arthroplasty EXAM: PORTABLE RIGHT KNEE - 1-2 VIEW COMPARISON:  11/15/2021 FINDINGS: Frontal and cross-table lateral views of the right knee are obtained. 3 component right knee arthroplasty is identified in the expected position without signs of acute complication. Postsurgical changes are seen in the overlying soft tissues. IMPRESSION: 1. Unremarkable right knee arthroplasty. Electronically Signed   By: Randa Ngo M.D.   On: 04/17/2022 10:15    Disposition: Discharge disposition: 01-Home or Self Care          Follow-up Information     Leandrew Koyanagi, MD. Schedule an appointment as soon as possible for a visit in 2 week(s).   Specialty: Orthopedic Surgery Contact information: [place] [address] [phone]         Arline Asp, MD .   Specialty: Pediatrics Contact information: [address] [place] [address] [phone]                  Signed: Aundra Dubin 04/18/2022, 7:52 AM

## 2022-04-18 NOTE — Evaluation (Signed)
Occupational Therapy Evaluation Patient Details Name: Johnny Bradley MRN: [id number] DOB: [birth year] Today's Date: 04/18/2022   History of Present Illness Pt is a 61 y/o male s/p R TKA. PMH includes HTN.   Clinical Impression   Patient evaluated by Occupational Therapy with no further acute OT needs identified. All education has been completed and the patient has no further questions. See below for any follow-up Occupational Therapy or equipment needs. OT to sign off. Thank you for referral.        Recommendations for follow up therapy are one component of a multi-disciplinary discharge planning process, led by the attending physician.  Recommendations may be updated based on patient status, additional functional criteria and insurance authorization.   Follow Up Recommendations  No OT follow up    Assistance Recommended at Discharge None  Patient can return home with the following Assist for transportation    Functional Status Assessment  Patient has had a recent decline in their functional status and demonstrates the ability to make significant improvements in function in a reasonable and predictable amount of time.  Equipment Recommendations  BSC/3in1 (wife already has urinal in room)    Recommendations for Other Services       Precautions / Restrictions Precautions Precautions: Knee Precaution Booklet Issued: Yes (comment) Precaution Comments: wife and pt expressed understanding of knee extension Restrictions Weight Bearing Restrictions: Yes RLE Weight Bearing: Weight bearing as tolerated      Mobility Bed Mobility Overal bed mobility: Modified Independent                  Transfers Overall transfer level: Needs assistance Equipment used: Rolling walker (2 wheels)   Sit to Stand: Modified independent (Device/Increase time)                  Balance                                           ADL either performed or assessed with  clinical judgement   ADL Overall ADL's : Modified independent                                       General ADL Comments: dressed on arrival. expressed wife was able to help with shoes. pt has slip on shoes at home and a reacher. pt educated on washing around the wound with use of clean wash cloth. pt educatd on dressing R LE first and then L LE. pt is allowed to sleep without ted hose and RN will notify when to discontinue. pt educated on use of alarm for pain management with wife in the room entering into the phone. Discussed car transfer, setting up 3n1 in bathroom and use of urinal as needed. Talked about use of towel with urinal when sitting.     Vision Baseline Vision/History: 1 Wears glasses Ability to See in Adequate Light: 0 Adequate Vision Assessment?: No apparent visual deficits     Perception     Praxis      Pertinent Vitals/Pain Pain Assessment Pain Assessment: 0-10 Pain Score: 4  Pain Location: Rt knee Pain Descriptors / Indicators: Sore, Discomfort Pain Intervention(s): Monitored during session, Premedicated before session, Repositioned     Hand Dominance Right   Extremity/Trunk Assessment Upper Extremity Assessment Upper Extremity Assessment: Overall  WFL for tasks assessed   Lower Extremity Assessment Lower Extremity Assessment: Defer to PT evaluation   Cervical / Trunk Assessment Cervical / Trunk Assessment: Normal   Communication Communication Communication: No difficulties   Cognition Arousal/Alertness: Awake/alert Behavior During Therapy: WFL for tasks assessed/performed Overall Cognitive Status: Within Functional Limits for tasks assessed                                       General Comments  wife present for all education.    Exercises     Shoulder Instructions      Home Living Family/patient expects to be discharged to:: Private residence Living Arrangements: Spouse/significant other Available Help at  Discharge: Family Type of Home: House Home Access: Stairs to enter Technical brewer of Steps: 2 Entrance Stairs-Rails: None Home Layout: Two level Alternate Level Stairs-Number of Steps: flight Alternate Level Stairs-Rails: Left;Right Bathroom Shower/Tub: Occupational psychologist: Standard     Home Equipment: Conservation officer, nature (2 wheels);Cane - single point   Additional Comments: has a bathroom upstairs and down with access to seating in both restrooms      Prior Functioning/Environment Prior Level of Function : Independent/Modified Independent             Mobility Comments: Uses cane when pain flares ADLs Comments: 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 >he has taken 6 weeks off from helping        OT Problem List:        OT Treatment/Interventions:      OT Goals(Current goals can be found in the care plan section) Acute Rehab OT Goals Patient Stated Goal: to be able to drive in 2 weeks Potential to Achieve Goals: Good  OT Frequency:      Co-evaluation              AM-PAC OT "6 Clicks" Daily Activity     Outcome Measure Help from another person eating meals?: None Help from another person taking care of personal grooming?: None Help from another person toileting, which includes using toliet, bedpan, or urinal?: None Help from another person bathing (including washing, rinsing, drying)?: None Help from another person to put on and taking off regular upper body clothing?: None Help from another person to put on and taking off regular lower body clothing?: A Little 6 Click Score: 23   End of Session Nurse Communication: Mobility status;Precautions  Activity Tolerance: Patient tolerated treatment well Patient left: Other (comment) (sitting eob awaiting d/c)  OT Visit Diagnosis: Unsteadiness on feet (R26.81)                Time: 0488-8916 OT Time Calculation (min): 22 min Charges:  OT General Charges $OT Visit: 1 Visit OT  Evaluation $OT Eval Moderate Complexity: 1 Mod   Brynn, OTR/L  Acute Rehabilitation Services Office: [phone] .   Jeri Modena 04/18/2022, 9:04 AM

## 2022-04-18 NOTE — Plan of Care (Signed)
Pt doing well. Pt and wife given D/C instructions with verbal understanding. Rx's were sent to the pharmacy by MD. Pt's incision is clean and dry with no sign of infection. Pt's IV was removed prior to D/C. Pt received 3-n-1 from [facility] per MD order. Pt D/C'd home via wheelchair per MD order. Pt is stable @ D/C and has no other needs at this time. Holli Humbles, RN

## 2022-04-19 ENCOUNTER — Telehealth: Payer: Self-pay

## 2022-04-19 ENCOUNTER — Other Ambulatory Visit: Payer: Self-pay | Admitting: Physician Assistant

## 2022-04-19 MED ORDER — KETOROLAC TROMETHAMINE 10 MG PO TABS
10.0000 mg | ORAL_TABLET | Freq: Two times a day (BID) | ORAL | 0 refills | Status: DC | PRN
Start: 2022-04-19 — End: 2022-06-29

## 2022-04-19 MED ORDER — OXYCODONE-ACETAMINOPHEN 5-325 MG PO TABS: 1.0000 | ORAL_TABLET | Freq: Four times a day (QID) | ORAL | 0 refills | Status: DC | PRN

## 2022-04-19 NOTE — Telephone Encounter (Signed)
I spoke to patient.  Have sent in toradol

## 2022-04-19 NOTE — Telephone Encounter (Signed)
Patient called stating that he is in a lot of pain and that the pain medication is not helping, he's not sleeping and has not had a bowel movement.  Stated that this is the worse pain and that he has PT today, but is not sure how he could do PT due to the pain he is having.  Patient had right TKA on Monday, 04/17/2022.  Cb# [phone].  Please advise.  Thank you.

## 2022-04-25 ENCOUNTER — Telehealth: Payer: Self-pay | Admitting: Orthopaedic Surgery

## 2022-04-25 NOTE — Telephone Encounter (Signed)
Patient called. He would like a refill on oxycodone called in. His call back number is [phone]

## 2022-04-26 ENCOUNTER — Other Ambulatory Visit: Payer: Self-pay | Admitting: Physician Assistant

## 2022-04-26 MED ORDER — OXYCODONE-ACETAMINOPHEN 5-325 MG PO TABS: 1.0000 | ORAL_TABLET | Freq: Three times a day (TID) | ORAL | 0 refills | Status: DC | PRN

## 2022-04-26 NOTE — Telephone Encounter (Signed)
Was able to send in

## 2022-04-26 NOTE — Telephone Encounter (Signed)
Johnny Bradley, can you send in?  My imprivata will not let me erx

## 2022-05-02 ENCOUNTER — Ambulatory Visit (INDEPENDENT_AMBULATORY_CARE_PROVIDER_SITE_OTHER): Payer: BC Managed Care – PPO | Admitting: Physician Assistant

## 2022-05-02 DIAGNOSIS — Z96651 Presence of right artificial knee joint: Secondary | ICD-10-CM

## 2022-05-02 MED ORDER — HYDROCODONE-ACETAMINOPHEN 5-325 MG PO TABS: 1.0000 | ORAL_TABLET | Freq: Four times a day (QID) | ORAL | 0 refills | Status: DC | PRN

## 2022-05-02 MED ORDER — METHOCARBAMOL 750 MG PO TABS: 750.0000 mg | ORAL_TABLET | Freq: Three times a day (TID) | ORAL | 2 refills | Status: DC | PRN

## 2022-05-02 NOTE — Progress Notes (Signed)
Post-Op Visit Note   Patient: Johnny Bradley           Date of Birth: December 01, 1960           MRN: [id number] Visit Date: 05/02/2022 PCP: [name], MD   Assessment & Plan:  Chief Complaint:  Chief Complaint  Patient presents with   Right Knee - Pain   Visit Diagnoses:  1. Hx of total knee replacement, right     Plan: Patient is a pleasant 61 year old gentleman who comes in today 2 weeks status post right total knee replacement 04/17/2022.  He has been doing okay.  He has been in a moderate amount of pain but has just been taking Tylenol.  He has been compliant taking his baby aspirin twice daily for DVT prophylaxis.  He has been getting home health physical therapy and is ambulating with a walker.  Examination of his right knee reveals a fully healed surgical scar with nylon sutures in place.  No evidence of infection or cellulitis.  Calves are soft and nontender.  He is neurovascular tact distally.  Today, sutures were removed and Steri-Strips applied.  Have sent in a referral for outpatient physical therapy.  Dental prophylaxis reinforced.  Continue baby aspirin twice daily for another 4 weeks for DVT prophylaxis.  Sent in [place] to take as needed.  He will follow-up in 4 weeks for repeat evaluation and 2 view x-rays of the right knee.  Call with concerns or questions.  Follow-Up Instructions: Return in about 4 weeks (around 05/30/2022).   Orders:  No orders of the defined types were placed in this encounter.  No orders of the defined types were placed in this encounter.   Imaging: No new imaging  PMFS History: Patient Active Problem List   Diagnosis Date Noted   Status post total left knee replacement 04/17/2022   Primary osteoarthritis of right knee 11/15/2021   Right shoulder pain 11/01/2021   Abdominal discomfort 11/01/2021   Bilateral knee pain 09/15/2020   OSA (obstructive sleep apnea) 02/03/2020   Chronic knee pain 02/03/2020   Family history of angiosarcoma  04/15/2019   Prediabetes 04/14/2019   HLD (hyperlipidemia) 10/02/2018   Subcutaneous nodule of right hand 10/02/2018   Skin lesion 05/29/2018   Hypertension 12/03/2017   Obesity 05/25/2017   Right low back pain 12/19/2014   Past Medical History:  Diagnosis Date   Arthritis    Back pain    Hyperlipidemia    Hypertension    Shoulder pain    Left   Sleep apnea     Family History  Problem Relation Age of Onset   Heart disease Father    Stroke Paternal Grandmother     Past Surgical History:  Procedure Laterality Date   KNEE ARTHROSCOPY Right    MCL Torn   PILONIDAL CYST EXCISION  1980   TOTAL KNEE ARTHROPLASTY Right 04/17/2022   Procedure: RIGHT TOTAL KNEE ARTHROPLASTY;  Surgeon: Leandrew Koyanagi, MD;  Location: [facility];  Service: Orthopedics;  Laterality: Right;   WISDOM TOOTH EXTRACTION     Social History   Occupational History   Not on file  Tobacco Use   Smoking status: Never   Smokeless tobacco: Never  Vaping Use   Vaping Use: Never used  Substance and Sexual Activity   Alcohol use: Yes    Alcohol/week: 12.0 standard drinks of alcohol    Types: 12 Cans of beer per week    Comment: On weekends- [employer] [address]  Drug use: No   Sexual activity: Yes    Partners: Female    Comment: Wife

## 2022-05-05 ENCOUNTER — Ambulatory Visit: Payer: BC Managed Care – PPO

## 2022-05-08 NOTE — Therapy (Signed)
OUTPATIENT PHYSICAL THERAPY LOWER EXTREMITY EVALUATION   Patient Name: Johnny Bradley MRN: [id number] DOB:1961-02-23, 61 y.o., male Today's Date: 05/08/2022    Past Medical History:  Diagnosis Date   Arthritis    Back pain    Hyperlipidemia    Hypertension    Shoulder pain    Left   Sleep apnea    Past Surgical History:  Procedure Laterality Date   KNEE ARTHROSCOPY Right    MCL Torn   PILONIDAL CYST EXCISION  1980   TOTAL KNEE ARTHROPLASTY Right 04/17/2022   Procedure: RIGHT TOTAL KNEE ARTHROPLASTY;  Surgeon: Leandrew Koyanagi, MD;  Location: [facility];  Service: Orthopedics;  Laterality: Right;   WISDOM TOOTH EXTRACTION     Patient Active Problem List   Diagnosis Date Noted   Status post total left knee replacement 04/17/2022   Primary osteoarthritis of right knee 11/15/2021   Right shoulder pain 11/01/2021   Abdominal discomfort 11/01/2021   Bilateral knee pain 09/15/2020   OSA (obstructive sleep apnea) 02/03/2020   Chronic knee pain 02/03/2020   Family history of angiosarcoma 04/15/2019   Prediabetes 04/14/2019   HLD (hyperlipidemia) 10/02/2018   Subcutaneous nodule of right hand 10/02/2018   Skin lesion 05/29/2018   Hypertension 12/03/2017   Obesity 05/25/2017   Right low back pain 12/19/2014    PCP: ***  REFERRING PROVIDER: ***  REFERRING DIAG: ***  THERAPY DIAG:  No diagnosis found.  Rationale for Evaluation and Treatment Rehabilitation  ONSET DATE: 04/17/2022   SUBJECTIVE:  SUBJECTIVE STATEMENT: ***  PERTINENT HISTORY: ***  PAIN:  Are you having pain? Yes:  NPRS scale: ***/10 Pain location: Right knee Pain description: *** Aggravating factors: *** Relieving factors: ***  PRECAUTIONS: Fall  WEIGHT BEARING RESTRICTIONS No  FALLS:  Has patient fallen in last 6 months? {fallsyesno:27318}  LIVING ENVIRONMENT: Lives with: {[facility] lives with:25569::"lives with their family"} Lives in: {Lives in:25570} Stairs: {opstairs:27293} Has following  equipment at home: {Assistive devices:23999}  OCCUPATION: ***  PLOF: Independent  PATIENT GOALS: ***   OBJECTIVE:  PATIENT SURVEYS:  FOTO ***  COGNITION:  Overall cognitive status: Within functional limits for tasks assessed     SENSATION: WFL  EDEMA:  {edema:24020}  MUSCLE LENGTH: ***  POSTURE:  {posture:25561}  PALPATION: ***  LOWER EXTREMITY ROM:  Active ROM Right eval Left eval  Knee flexion    Knee extension     LOWER EXTREMITY MMT:  MMT Right eval Left eval  Hip flexion    Hip extension    Hip abduction    Knee flexion    Knee extension     FUNCTIONAL TESTS:  {Functional tests:24029}  GAIT: Distance walked: *** Assistive device utilized: {Assistive devices:23999} Level of assistance: {Levels of assistance:24026} Comments: ***   TODAY'S TREATMENT: ***  PATIENT EDUCATION:  Education details: Exam findings, POC, HEP Person educated: Patient Education method: Explanation, Demonstration, Tactile cues, Verbal cues, and Handouts Education comprehension: verbalized understanding, returned demonstration, verbal cues required, tactile cues required, and needs further education  HOME EXERCISE PROGRAM: ***   ASSESSMENT: CLINICAL IMPRESSION: Patient is a 61 y.o. male who was seen today for physical therapy evaluation and treatment for right knee following right TKA on 04/17/2022.    OBJECTIVE IMPAIRMENTS {opptimpairments:25111}.   ACTIVITY LIMITATIONS {activitylimitations:27494}  PARTICIPATION LIMITATIONS: {participationrestrictions:25113}  PERSONAL FACTORS {Personal factors:25162} are also affecting patient's functional outcome.   REHAB POTENTIAL: {rehabpotential:25112}  CLINICAL DECISION MAKING: {clinical decision making:25114}  EVALUATION COMPLEXITY: {Evaluation complexity:25115}   GOALS: Goals reviewed with patient? Yes  SHORT TERM GOALS: Target date: {follow up:25551}   Patient will be I with initial HEP in order to  progress with therapy. Baseline: HEP provided at eval Goal status: INITIAL  2.  PT will review FOTO with patient by 3rd visit in order to understand expected progress and outcome with therapy. Baseline: FOTO assessed at eval Goal status: INITIAL  3.  Patient will demonstrate right knee AROM *** deg in order to improve gait and functional mobility. Baseline: right knee AROM *** deg Goal status: INITIAL  4.  Patient will report right knee pain </= ***/10 in order to reduce functional limitations Baseline: right knee pain ***/10 Goal status: INITIAL  LONG TERM GOALS: Target date: {follow up:25551}   Patient will be I with final HEP to maintain progress from PT. Baseline: HEP provided at eval Goal status: INITIAL  2.  Patient will report >/= ***% status on FOTO to indicate improved functional ability. Baseline: % functional status Goal status: INITIAL  3.  Patient will demonstrate right knee AROM *** deg in order to normalize gait and transfers without limitation Baseline:  right knee AROM *** deg Goal status: INITIAL  4.  Patient will demonstrate right knee strength grossly >/= *** MMT in order to improve walking and stair negotiation Baseline: right knee strength *** MMT Goal status: INITIAL  5.  Patient will ambulate >/= *** ft with 6MWT using LRAD in order to improve community access Baseline: 6MWT *** ft using *** Goal status: INITIAL  6.  Patient will report right knee pain </= ***/10 in order to reduce functional limitations Baseline: right knee pain ***/10 Goal status: INITIAL   PLAN: PT FREQUENCY: 1-2x/week  PT DURATION: 8 weeks  PLANNED INTERVENTIONS: {rehab planned interventions:25118::"Therapeutic exercises","Therapeutic activity","Neuromuscular re-education","Balance training","Gait training","Patient/Family education","Self Care","Joint mobilization"}  PLAN FOR NEXT SESSION: Review HEP and progress PRN, ***   Hilda Blades, PT, DPT, LAT, ATC 05/08/22   1:45 PM Phone: [phone] Fax: [phone]

## 2022-05-09 ENCOUNTER — Encounter: Payer: Self-pay | Admitting: Physical Therapy

## 2022-05-09 ENCOUNTER — Other Ambulatory Visit: Payer: Self-pay

## 2022-05-09 ENCOUNTER — Ambulatory Visit: Payer: BC Managed Care – PPO | Attending: Family Medicine | Admitting: Physical Therapy

## 2022-05-09 DIAGNOSIS — M6281 Muscle weakness (generalized): Secondary | ICD-10-CM | POA: Diagnosis present

## 2022-05-09 DIAGNOSIS — R2689 Other abnormalities of gait and mobility: Secondary | ICD-10-CM | POA: Insufficient documentation

## 2022-05-09 DIAGNOSIS — G8929 Other chronic pain: Secondary | ICD-10-CM | POA: Diagnosis present

## 2022-05-09 DIAGNOSIS — M25561 Pain in right knee: Secondary | ICD-10-CM | POA: Insufficient documentation

## 2022-05-09 DIAGNOSIS — R600 Localized edema: Secondary | ICD-10-CM | POA: Diagnosis present

## 2022-05-09 DIAGNOSIS — M25661 Stiffness of right knee, not elsewhere classified: Secondary | ICD-10-CM | POA: Diagnosis present

## 2022-05-09 NOTE — Patient Instructions (Signed)
Access Code: [email or web address] URL: [email or web address] Date: 05/09/2022 Prepared by: Hilda Blades  Exercises - Long Sitting Calf Stretch with Strap  - 2-3 x daily - 3 sets - 30 seconds hold - Seated Hamstring Stretch  - 2-3 x daily - 3 sets - 30 seconds hold - Supine Knee Extension Mobilization with Weight  - 2-3 x daily - 5 minutes hold - Seated Knee Flexion Stretch  - 2-3 x daily - 10 reps - 5 seconds hold - Supine Heel Slide with Strap  - 2-3 x daily - 10 reps - 5 seconds hold - Active Straight Leg Raise with Quad Set  - 2 x daily - 10 reps

## 2022-05-10 NOTE — Therapy (Signed)
OUTPATIENT PHYSICAL THERAPY TREATMENT NOTE   Patient Name: Johnny Bradley MRN: [id number] DOB:Dec 31, 1960, 61 y.o., male Today's Date: 05/12/2022  PCP: [name], MD   REFERRING PROVIDER: Aundra Dubin, PA-C   END OF SESSION:   PT End of Session - 05/12/22 1004     Visit Number 2    Number of Visits 17    Date for PT Re-Evaluation 07/04/22    Authorization Type [employer] / [employer]    PT Start Time 1000    PT Stop Time 1045    PT Time Calculation (min) 45 min    Activity Tolerance Patient tolerated treatment well    Behavior During Therapy WFL for tasks assessed/performed             Past Medical History:  Diagnosis Date   Arthritis    Back pain    Hyperlipidemia    Hypertension    Shoulder pain    Left   Sleep apnea    Past Surgical History:  Procedure Laterality Date   KNEE ARTHROSCOPY Right    MCL Torn   PILONIDAL CYST EXCISION  1980   TOTAL KNEE ARTHROPLASTY Right 04/17/2022   Procedure: RIGHT TOTAL KNEE ARTHROPLASTY;  Surgeon: Leandrew Koyanagi, MD;  Location: [facility];  Service: Orthopedics;  Laterality: Right;   WISDOM TOOTH EXTRACTION     Patient Active Problem List   Diagnosis Date Noted   Status post total left knee replacement 04/17/2022   Primary osteoarthritis of right knee 11/15/2021   Right shoulder pain 11/01/2021   Abdominal discomfort 11/01/2021   Bilateral knee pain 09/15/2020   OSA (obstructive sleep apnea) 02/03/2020   Chronic knee pain 02/03/2020   Family history of angiosarcoma 04/15/2019   Prediabetes 04/14/2019   HLD (hyperlipidemia) 10/02/2018   Subcutaneous nodule of right hand 10/02/2018   Skin lesion 05/29/2018   Hypertension 12/03/2017   Obesity 05/25/2017   Right low back pain 12/19/2014    REFERRING DIAG: Hx of total knee replacement, right  THERAPY DIAG:  Chronic pain of right knee  Stiffness of right knee, not elsewhere classified  Muscle weakness (generalized)  Other abnormalities of gait and  mobility  Localized edema  Rationale for Evaluation and Treatment Rehabilitation  PERTINENT HISTORY: None  PRECAUTIONS: None  SUBJECTIVE: Patient reports he has been consistent with his exercises and it is hard for him to tell if he is progressing. He has been weaning from medication and has been able to take less recently.   PAIN:  Are you having pain? Yes:  NPRS scale: 1/10 (5/10 pain at worse with activity) Pain location: Right knee Pain description: Aching, sore, tight,  Aggravating factors: Standing, walking, straightening the knee, stairs Relieving factors: Rest, medication   PATIENT GOALS: Progress to walking without device   OBJECTIVE: (objective measures completed at initial evaluation unless otherwise dated) PATIENT SURVEYS:  FOTO 41% functional status   EDEMA:  Circumferential: Not formally assessed. Patient does exhibit right knee and lower leg swelling compared to left   MUSCLE LENGTH: Limitation in calf, hamstring, and quad flexibility   LOWER EXTREMITY ROM:   Active ROM Right eval Left eval Right   Knee flexion 100 -   Knee extension 10 lacking - 7 lacking    LOWER EXTREMITY MMT:   MMT Right eval Left eval  Hip flexion 4- -  Hip extension 3 -  Hip abduction 3 -  Knee flexion 4 -  Knee extension 4- -    FUNCTIONAL TESTS:  Not assessed   GAIT: Distance walked: 50 ft Assistive device utilized: Environmental consultant - 2 wheeled Level of assistance: Modified independence Comments: limitation in knee extension with stance, minimal flexion of knee with swing, flat foot strike, antalgic on right, heavy reliance on walker for support     TODAY'S TREATMENT: [facility] Adult PT Treatment:                                                DATE: 05/12/2022 Therapeutic Exercise: NuStep L5 x 5 min with UE/LE while taking subjective Supine heel prop x 2 min Quad set with towel roll under knee 10 x 5 sec hld SAQ 10 x 5 sec hold SLR with focus on quad set 2 x 10 Seated  hamstring stretch with manual pressure over knee 3 x 30 sec Manual: Seated and supine knee mobs Patellar mobs all directions Seated and supine right knee PROM Supine passive hamstring stretch   [facility] Adult PT Treatment:                                                DATE: 05/09/2022 Therapeutic Exercise: Longsitting calf stretch 2 x 30 sec Seated hamstring stretch 3 x 30 sec Seated knee flexion stretch with overpressure 5 x 5 sec Supine heel slide with strap 5 x 5 sec SLR with quad set x 10 Supine heel prop x 1 min Manual: Patellar mobs all directions Seated and supine right knee PROM   PATIENT EDUCATION:  Education details: HEP Person educated: Patient Education method: Consulting civil engineer, Media planner, Corporate treasurer cues, Verbal cues, and Handouts Education comprehension: verbalized understanding, returned demonstration, verbal cues required, tactile cues required, and needs further education   HOME EXERCISE PROGRAM: Access Code: [email or web address]     ASSESSMENT: CLINICAL IMPRESSION: Patient tolerated therapy well with no adverse effects. Therapy focused primarily on progressing right knee mobility and quad control with good tolerance. He continues to demonstrate limitation in knee extension so focused primarily on stretching to improve knee extension this visit followed by active quad exercises. He was encouraged to continue stretching consistently at home. No changes made to HEP this visit. Patient would benefit from continued skilled PT to progress his mobility and strength to reduce pain and maximize functional ability.     OBJECTIVE IMPAIRMENTS Abnormal gait, decreased activity tolerance, decreased balance, difficulty walking, decreased ROM, decreased strength, increased edema, impaired flexibility, and pain.    ACTIVITY LIMITATIONS lifting, bending, sitting, standing, squatting, stairs, transfers, toileting, dressing, and locomotion level   PARTICIPATION LIMITATIONS: meal prep, cleaning,  driving, shopping, community activity, and yard work   [place], Past/current experiences, and Time since onset of injury/illness/exacerbation are also affecting patient's functional outcome.      GOALS: Goals reviewed with patient? Yes   SHORT TERM GOALS: Target date: 06/06/2022    Patient will be I with initial HEP in order to progress with therapy. Baseline: HEP provided at eval Goal status: INITIAL   2.  PT will review FOTO with patient by 3rd visit in order to understand expected progress and outcome with therapy. Baseline: FOTO assessed at eval Goal status: INITIAL   3.  Patient will demonstrate right knee AROM 3-115 deg in order to improve gait and functional mobility.  Baseline: right knee AROM 10-100 deg Goal status: INITIAL   4.  Patient will report right knee pain with activity </= 3/10 in order to reduce functional limitations Baseline: right knee pain 5/10 with activity Goal status: INITIAL   LONG TERM GOALS: Target date: 07/04/2022    Patient will be I with final HEP to maintain progress from PT. Baseline: HEP provided at eval Goal status: INITIAL   2.  Patient will report >/= 60% status on FOTO to indicate improved functional ability. Baseline: 41% functional status Goal status: INITIAL   3.  Patient will demonstrate right knee AROM 0-120 deg in order to normalize gait and transfers without limitation Baseline:  right knee AROM 10-100 deg Goal status: INITIAL   4.  Patient will demonstrate right knee strength grossly = 5/5 MMT in order to improve walking and stair negotiation Baseline: right knee strength limitation (see above) Goal status: INITIAL   5.  Patient will ambulate community level distances with LRAD in order to improve community access Baseline:patient ambulating household distances with RW Goal status: INITIAL   6.  Patient will report right knee pain with activity </= 1/10 in order to reduce functional limitations Baseline:  right knee pain 5/10 with activity Goal status: INITIAL     PLAN: PT FREQUENCY: 1-2x/week   PT DURATION: 8 weeks   PLANNED INTERVENTIONS: Therapeutic exercises, Therapeutic activity, Neuromuscular re-education, Balance training, Gait training, Patient/Family education, Self Care, Joint mobilization, Joint manipulation, Stair training, Aquatic Therapy, Dry Needling, Cryotherapy, Moist heat, Taping, Vasopneumatic device, Manual therapy, and Re-evaluation   PLAN FOR NEXT SESSION: Review HEP and progress PRN, manual/stretching to improve right knee mobility, strength progression, gait training with progression to cane    Hilda Blades, PT, DPT, LAT, ATC 05/12/22  11:14 AM Phone: [phone] Fax: [phone]

## 2022-05-12 ENCOUNTER — Encounter: Payer: Self-pay | Admitting: Physical Therapy

## 2022-05-12 ENCOUNTER — Other Ambulatory Visit: Payer: Self-pay

## 2022-05-12 ENCOUNTER — Ambulatory Visit: Payer: BC Managed Care – PPO | Admitting: Physical Therapy

## 2022-05-12 DIAGNOSIS — M25561 Pain in right knee: Secondary | ICD-10-CM | POA: Diagnosis not present

## 2022-05-12 DIAGNOSIS — M6281 Muscle weakness (generalized): Secondary | ICD-10-CM

## 2022-05-12 DIAGNOSIS — G8929 Other chronic pain: Secondary | ICD-10-CM

## 2022-05-12 DIAGNOSIS — R2689 Other abnormalities of gait and mobility: Secondary | ICD-10-CM

## 2022-05-12 DIAGNOSIS — M25661 Stiffness of right knee, not elsewhere classified: Secondary | ICD-10-CM

## 2022-05-12 DIAGNOSIS — R600 Localized edema: Secondary | ICD-10-CM

## 2022-05-15 ENCOUNTER — Other Ambulatory Visit: Payer: Self-pay | Admitting: Physician Assistant

## 2022-05-15 ENCOUNTER — Telehealth: Payer: Self-pay | Admitting: Orthopaedic Surgery

## 2022-05-15 MED ORDER — HYDROCODONE-ACETAMINOPHEN 5-325 MG PO TABS: 1.0000 | ORAL_TABLET | Freq: Four times a day (QID) | ORAL | 0 refills | Status: DC | PRN

## 2022-05-15 NOTE — Telephone Encounter (Signed)
Patient called needing Rx filled Hydrocodone. The number to contact patient is [phone]

## 2022-05-15 NOTE — Therapy (Signed)
OUTPATIENT PHYSICAL THERAPY TREATMENT NOTE   Patient Name: Johnny Bradley MRN: [id number] DOB:1961-07-26, 61 y.o., male Today's Date: 05/16/2022  PCP: [name], MD   REFERRING PROVIDER: Aundra Dubin, PA-C   END OF SESSION:   PT End of Session - 05/16/22 1447     Visit Number 3    Number of Visits 17    Date for PT Re-Evaluation 07/04/22    Authorization Type BCBS / [employer]    PT Start Time 1447    PT Stop Time 1527    PT Time Calculation (min) 40 min    Activity Tolerance Patient tolerated treatment well    Behavior During Therapy WFL for tasks assessed/performed              Past Medical History:  Diagnosis Date   Arthritis    Back pain    Hyperlipidemia    Hypertension    Shoulder pain    Left   Sleep apnea    Past Surgical History:  Procedure Laterality Date   KNEE ARTHROSCOPY Right    MCL Torn   PILONIDAL CYST EXCISION  1980   TOTAL KNEE ARTHROPLASTY Right 04/17/2022   Procedure: RIGHT TOTAL KNEE ARTHROPLASTY;  Surgeon: Leandrew Koyanagi, MD;  Location: [facility];  Service: Orthopedics;  Laterality: Right;   WISDOM TOOTH EXTRACTION     Patient Active Problem List   Diagnosis Date Noted   Status post total left knee replacement 04/17/2022   Primary osteoarthritis of right knee 11/15/2021   Right shoulder pain 11/01/2021   Abdominal discomfort 11/01/2021   Bilateral knee pain 09/15/2020   OSA (obstructive sleep apnea) 02/03/2020   Chronic knee pain 02/03/2020   Family history of angiosarcoma 04/15/2019   Prediabetes 04/14/2019   HLD (hyperlipidemia) 10/02/2018   Subcutaneous nodule of right hand 10/02/2018   Skin lesion 05/29/2018   Hypertension 12/03/2017   Obesity 05/25/2017   Right low back pain 12/19/2014    REFERRING DIAG: Hx of total knee replacement, right  THERAPY DIAG:  Chronic pain of right knee  Stiffness of right knee, not elsewhere classified  Muscle weakness (generalized)  Other abnormalities of gait and  mobility  Localized edema  Rationale for Evaluation and Treatment Rehabilitation  PERTINENT HISTORY: None  PRECAUTIONS: None  SUBJECTIVE: Patient reports his pain is low, but he still feels like he isn't getting his extension back.   PAIN:  Are you having pain? Yes:  NPRS scale: 1/10  Pain location: Right knee Pain description: Aching, sore, tight,  Aggravating factors: Standing, walking, straightening the knee, stairs Relieving factors: Rest, medication   PATIENT GOALS: Progress to walking without device   OBJECTIVE: (objective measures completed at initial evaluation unless otherwise dated) PATIENT SURVEYS:  FOTO 41% functional status   EDEMA:  Circumferential: Not formally assessed. Patient does exhibit right knee and lower leg swelling compared to left   MUSCLE LENGTH: Limitation in calf, hamstring, and quad flexibility   LOWER EXTREMITY ROM:   Active ROM Right eval Left eval Right  05/16/22  Knee flexion 100 -  105; 108 after recumbent bike  Knee extension 10 lacking - 7 lacking     LOWER EXTREMITY MMT:   MMT Right eval Left eval  Hip flexion 4- -  Hip extension 3 -  Hip abduction 3 -  Knee flexion 4 -  Knee extension 4- -    FUNCTIONAL TESTS:  Not assessed   GAIT: Distance walked: 50 ft Assistive device utilized: Environmental consultant - 2  wheeled Level of assistance: Modified independence Comments: limitation in knee extension with stance, minimal flexion of knee with swing, flat foot strike, antalgic on right, heavy reliance on walker for support     TODAY'S TREATMENT: [facility] Adult PT Treatment:                                                DATE: 05/16/22 Therapeutic Exercise: NuStep level 5 x 5 minutes UE/LE  Supine heel prop x 2 minutes with 5 lbs  Supine quad set with towel roll 1 x 10; 10 sec hold  Recumbent bike rocking then backward revolutions x 5 minutes; no resistance  Manual Therapy: Rt knee PROM to tolerance into flexion and extension Patellar  mobilizations all planes grade II-III Passive hamstring stretch Rt knee mobilization A/P grade II-III STM Rt quad    [facility] Adult PT Treatment:                                                DATE: 05/12/2022 Therapeutic Exercise: NuStep L5 x 5 min with UE/LE while taking subjective Supine heel prop x 2 min Quad set with towel roll under knee 10 x 5 sec hld SAQ 10 x 5 sec hold SLR with focus on quad set 2 x 10 Seated hamstring stretch with manual pressure over knee 3 x 30 sec Manual: Seated and supine knee mobs Patellar mobs all directions Seated and supine right knee PROM Supine passive hamstring stretch   [facility] Adult PT Treatment:                                                DATE: 05/09/2022 Therapeutic Exercise: Longsitting calf stretch 2 x 30 sec Seated hamstring stretch 3 x 30 sec Seated knee flexion stretch with overpressure 5 x 5 sec Supine heel slide with strap 5 x 5 sec SLR with quad set x 10 Supine heel prop x 1 min Manual: Patellar mobs all directions Seated and supine right knee PROM   PATIENT EDUCATION:  Education details: N/A Person educated: N/A Education method: N/A Education comprehension: N/A   HOME EXERCISE PROGRAM: Access Code: [id number]     ASSESSMENT: CLINICAL IMPRESSION: Patient is 4 weeks s/p Rt TKA making gradual progress. Improvement noted in knee flexion AROM compared to initial evaluation achieving 105 degrees today. Knee flexion and extension AROM continues to remain limited, so session focused on restoring ROM. He reports pain at end range with both flexion and extension PROM feeling it mostly about the anterior knee. He was able to complete backward revolution on the bike today with slight improvement in flexion noted following this measured at 108 degrees.      OBJECTIVE IMPAIRMENTS Abnormal gait, decreased activity tolerance, decreased balance, difficulty walking, decreased ROM, decreased strength, increased edema, impaired flexibility, and  pain.    ACTIVITY LIMITATIONS lifting, bending, sitting, standing, squatting, stairs, transfers, toileting, dressing, and locomotion level   PARTICIPATION LIMITATIONS: meal prep, cleaning, driving, shopping, community activity, and yard work   [place], Past/current experiences, and Time since onset of injury/illness/exacerbation are also affecting patient's functional  outcome.      GOALS: Goals reviewed with patient? Yes   SHORT TERM GOALS: Target date: 06/06/2022    Patient will be I with initial HEP in order to progress with therapy. Baseline: HEP provided at eval Goal status: INITIAL   2.  PT will review FOTO with patient by 3rd visit in order to understand expected progress and outcome with therapy. Baseline: FOTO assessed at eval Goal status: INITIAL   3.  Patient will demonstrate right knee AROM 3-115 deg in order to improve gait and functional mobility. Baseline: right knee AROM 10-100 deg Goal status: INITIAL   4.  Patient will report right knee pain with activity </= 3/10 in order to reduce functional limitations Baseline: right knee pain 5/10 with activity Goal status: INITIAL   LONG TERM GOALS: Target date: 07/04/2022    Patient will be I with final HEP to maintain progress from PT. Baseline: HEP provided at eval Goal status: INITIAL   2.  Patient will report >/= 60% status on FOTO to indicate improved functional ability. Baseline: 41% functional status Goal status: INITIAL   3.  Patient will demonstrate right knee AROM 0-120 deg in order to normalize gait and transfers without limitation Baseline:  right knee AROM 10-100 deg Goal status: INITIAL   4.  Patient will demonstrate right knee strength grossly = 5/5 MMT in order to improve walking and stair negotiation Baseline: right knee strength limitation (see above) Goal status: INITIAL   5.  Patient will ambulate community level distances with LRAD in order to improve community  access Baseline:patient ambulating household distances with RW Goal status: INITIAL   6.  Patient will report right knee pain with activity </= 1/10 in order to reduce functional limitations Baseline: right knee pain 5/10 with activity Goal status: INITIAL     PLAN: PT FREQUENCY: 1-2x/week   PT DURATION: 8 weeks   PLANNED INTERVENTIONS: Therapeutic exercises, Therapeutic activity, Neuromuscular re-education, Balance training, Gait training, Patient/Family education, Self Care, Joint mobilization, Joint manipulation, Stair training, Aquatic Therapy, Dry Needling, Cryotherapy, Moist heat, Taping, Vasopneumatic device, Manual therapy, and Re-evaluation   PLAN FOR NEXT SESSION: Review HEP and progress PRN, manual/stretching to improve right knee mobility, strength progression, gait training with progression to cane   Gwendolyn Grant, PT, DPT, ATC 05/16/22 3:31 PM

## 2022-05-15 NOTE — Telephone Encounter (Signed)
Sent in

## 2022-05-16 ENCOUNTER — Telehealth: Payer: Self-pay | Admitting: Orthopaedic Surgery

## 2022-05-16 ENCOUNTER — Ambulatory Visit: Payer: BC Managed Care – PPO

## 2022-05-16 DIAGNOSIS — R600 Localized edema: Secondary | ICD-10-CM

## 2022-05-16 DIAGNOSIS — M6281 Muscle weakness (generalized): Secondary | ICD-10-CM

## 2022-05-16 DIAGNOSIS — M25661 Stiffness of right knee, not elsewhere classified: Secondary | ICD-10-CM

## 2022-05-16 DIAGNOSIS — R2689 Other abnormalities of gait and mobility: Secondary | ICD-10-CM

## 2022-05-16 DIAGNOSIS — M25561 Pain in right knee: Secondary | ICD-10-CM | POA: Diagnosis not present

## 2022-05-16 DIAGNOSIS — G8929 Other chronic pain: Secondary | ICD-10-CM

## 2022-05-16 NOTE — Telephone Encounter (Signed)
Tried to call pharmacy. Could not get through to anyone. Will try again later.

## 2022-05-16 NOTE — Telephone Encounter (Signed)
Pt called requesting to contact pharmacy for refill of hydrocodone. Pt states he received a message from his pharmacy they need more information to refill his pain meds. Please contact pharmacy. Pt phone number is [phone].

## 2022-05-16 NOTE — Telephone Encounter (Signed)
Called pharmacy and patient. Needs prior auth, but is ready to pick up out of pocket for $12.28 if he would like.

## 2022-05-17 NOTE — Therapy (Signed)
OUTPATIENT PHYSICAL THERAPY TREATMENT NOTE   Patient Name: Johnny Bradley MRN: [id number] DOB:05-19-1961, 61 y.o., male Today's Date: 05/18/2022  PCP: [name], MD   REFERRING PROVIDER: Aundra Dubin, PA-C   END OF SESSION:   PT End of Session - 05/18/22 1450     Visit Number 4    Number of Visits 17    Date for PT Re-Evaluation 07/04/22    Authorization Type BCBS / [employer]    PT Start Time 1450    PT Stop Time 1530    PT Time Calculation (min) 40 min    Activity Tolerance Patient tolerated treatment well    Behavior During Therapy WFL for tasks assessed/performed               Past Medical History:  Diagnosis Date   Arthritis    Back pain    Hyperlipidemia    Hypertension    Shoulder pain    Left   Sleep apnea    Past Surgical History:  Procedure Laterality Date   KNEE ARTHROSCOPY Right    MCL Torn   PILONIDAL CYST EXCISION  1980   TOTAL KNEE ARTHROPLASTY Right 04/17/2022   Procedure: RIGHT TOTAL KNEE ARTHROPLASTY;  Surgeon: Leandrew Koyanagi, MD;  Location: [facility];  Service: Orthopedics;  Laterality: Right;   WISDOM TOOTH EXTRACTION     Patient Active Problem List   Diagnosis Date Noted   Status post total left knee replacement 04/17/2022   Primary osteoarthritis of right knee 11/15/2021   Right shoulder pain 11/01/2021   Abdominal discomfort 11/01/2021   Bilateral knee pain 09/15/2020   OSA (obstructive sleep apnea) 02/03/2020   Chronic knee pain 02/03/2020   Family history of angiosarcoma 04/15/2019   Prediabetes 04/14/2019   HLD (hyperlipidemia) 10/02/2018   Subcutaneous nodule of right hand 10/02/2018   Skin lesion 05/29/2018   Hypertension 12/03/2017   Obesity 05/25/2017   Right low back pain 12/19/2014    REFERRING DIAG: Hx of total knee replacement, right  THERAPY DIAG:  Chronic pain of right knee  Stiffness of right knee, not elsewhere classified  Muscle weakness (generalized)  Other abnormalities of gait and  mobility  Localized edema  Rationale for Evaluation and Treatment Rehabilitation  PERTINENT HISTORY: None  PRECAUTIONS: None  SUBJECTIVE: Patient reports his knee is more stiff than painful right now.   PAIN:  Are you having pain? Yes:  NPRS scale: 1/10  Pain location: Right knee Pain description: Aching, sore, tight,  Aggravating factors: Standing, walking, straightening the knee, stairs Relieving factors: Rest, medication   PATIENT GOALS: Progress to walking without device   OBJECTIVE: (objective measures completed at initial evaluation unless otherwise dated) PATIENT SURVEYS:  FOTO 41% functional status   EDEMA:  Circumferential: Not formally assessed. Patient does exhibit right knee and lower leg swelling compared to left   MUSCLE LENGTH: Limitation in calf, hamstring, and quad flexibility   LOWER EXTREMITY ROM:   Active ROM Right eval Left eval Right  05/16/22 05/18/22  Knee flexion 100 -  105; 108 after recumbent bike   Knee extension 10 lacking - 7 lacking  Rt lacking 7     LOWER EXTREMITY MMT:   MMT Right eval Left eval  Hip flexion 4- -  Hip extension 3 -  Hip abduction 3 -  Knee flexion 4 -  Knee extension 4- -    FUNCTIONAL TESTS:  Not assessed   GAIT: Distance walked: 50 ft Assistive device utilized: Environmental consultant -  2 wheeled Level of assistance: Modified independence Comments: limitation in knee extension with stance, minimal flexion of knee with swing, flat foot strike, antalgic on right, heavy reliance on walker for support     TODAY'S TREATMENT: [facility] Adult PT Treatment:                                                DATE: 05/18/22 Therapeutic Exercise: Recumbent bike no resistance rocking  Supine heel prop x 5 minutes with 5 lbs and MHP to posterior knee  SAQ 2 x 10  Weight shifts lateral 1 x 10   Manual Therapy: Rt knee PROM to tolerance into flexion and extension Patellar mobilizations all planes grade II-III Passive hamstring  stretch Rt knee mobilization A/P grade II-III STM Rt quad    [facility] Adult PT Treatment:                                                DATE: 05/16/22 Therapeutic Exercise: NuStep level 5 x 5 minutes UE/LE  Supine heel prop x 2 minutes with 5 lbs  Supine quad set with towel roll 1 x 10; 10 sec hold  Recumbent bike rocking then backward revolutions x 5 minutes; no resistance  Manual Therapy: Rt knee PROM to tolerance into flexion and extension Patellar mobilizations all planes grade II-III Passive hamstring stretch Rt knee mobilization A/P grade II-III STM Rt quad    [facility] Adult PT Treatment:                                                DATE: 05/12/2022 Therapeutic Exercise: NuStep L5 x 5 min with UE/LE while taking subjective Supine heel prop x 2 min Quad set with towel roll under knee 10 x 5 sec hld SAQ 10 x 5 sec hold SLR with focus on quad set 2 x 10 Seated hamstring stretch with manual pressure over knee 3 x 30 sec Manual: Seated and supine knee mobs Patellar mobs all directions Seated and supine right knee PROM Supine passive hamstring stretch   [facility] Adult PT Treatment:                                                DATE: 05/09/2022 Therapeutic Exercise: Longsitting calf stretch 2 x 30 sec Seated hamstring stretch 3 x 30 sec Seated knee flexion stretch with overpressure 5 x 5 sec Supine heel slide with strap 5 x 5 sec SLR with quad set x 10 Supine heel prop x 1 min Manual: Patellar mobs all directions Seated and supine right knee PROM   PATIENT EDUCATION:  Education details: N/A Person educated: N/A Education method: N/A Education comprehension: N/A   HOME EXERCISE PROGRAM: Access Code: [email or web address]     ASSESSMENT: CLINICAL IMPRESSION: Continued focus on knee ROM with no change noted in knee extension AROM compared to previous assessment. Patella remains hypomobile in the superior and inferior direction. Fairly good tolerance to passive range with patient  reporting increased pain at end range of flexion and extension. He has good quad activation with SAQ, but is unable to achieve full extension at this time.      OBJECTIVE IMPAIRMENTS Abnormal gait, decreased activity tolerance, decreased balance, difficulty walking, decreased ROM, decreased strength, increased edema, impaired flexibility, and pain.    ACTIVITY LIMITATIONS lifting, bending, sitting, standing, squatting, stairs, transfers, toileting, dressing, and locomotion level   PARTICIPATION LIMITATIONS: meal prep, cleaning, driving, shopping, community activity, and yard work   [place], Past/current experiences, and Time since onset of injury/illness/exacerbation are also affecting patient's functional outcome.      GOALS: Goals reviewed with patient? Yes   SHORT TERM GOALS: Target date: 06/06/2022    Patient will be I with initial HEP in order to progress with therapy. Baseline: HEP provided at eval Goal status: INITIAL   2.  PT will review FOTO with patient by 3rd visit in order to understand expected progress and outcome with therapy. Baseline: FOTO assessed at eval Goal status: INITIAL   3.  Patient will demonstrate right knee AROM 3-115 deg in order to improve gait and functional mobility. Baseline: right knee AROM 10-100 deg Goal status: INITIAL   4.  Patient will report right knee pain with activity </= 3/10 in order to reduce functional limitations Baseline: right knee pain 5/10 with activity Goal status: INITIAL   LONG TERM GOALS: Target date: 07/04/2022    Patient will be I with final HEP to maintain progress from PT. Baseline: HEP provided at eval Goal status: INITIAL   2.  Patient will report >/= 60% status on FOTO to indicate improved functional ability. Baseline: 41% functional status Goal status: INITIAL   3.  Patient will demonstrate right knee AROM 0-120 deg in order to normalize gait and transfers without limitation Baseline:  right  knee AROM 10-100 deg Goal status: INITIAL   4.  Patient will demonstrate right knee strength grossly = 5/5 MMT in order to improve walking and stair negotiation Baseline: right knee strength limitation (see above) Goal status: INITIAL   5.  Patient will ambulate community level distances with LRAD in order to improve community access Baseline:patient ambulating household distances with RW Goal status: INITIAL   6.  Patient will report right knee pain with activity </= 1/10 in order to reduce functional limitations Baseline: right knee pain 5/10 with activity Goal status: INITIAL     PLAN: PT FREQUENCY: 1-2x/week   PT DURATION: 8 weeks   PLANNED INTERVENTIONS: Therapeutic exercises, Therapeutic activity, Neuromuscular re-education, Balance training, Gait training, Patient/Family education, Self Care, Joint mobilization, Joint manipulation, Stair training, Aquatic Therapy, Dry Needling, Cryotherapy, Moist heat, Taping, Vasopneumatic device, Manual therapy, and Re-evaluation   PLAN FOR NEXT SESSION: Review HEP and progress PRN, manual/stretching to improve right knee mobility, strength progression, gait training with progression to cane   Gwendolyn Grant, PT, DPT, ATC 05/18/22 3:31 PM

## 2022-05-18 ENCOUNTER — Ambulatory Visit: Payer: BC Managed Care – PPO

## 2022-05-18 DIAGNOSIS — M6281 Muscle weakness (generalized): Secondary | ICD-10-CM

## 2022-05-18 DIAGNOSIS — M25561 Pain in right knee: Secondary | ICD-10-CM | POA: Diagnosis not present

## 2022-05-18 DIAGNOSIS — M25661 Stiffness of right knee, not elsewhere classified: Secondary | ICD-10-CM

## 2022-05-18 DIAGNOSIS — R2689 Other abnormalities of gait and mobility: Secondary | ICD-10-CM

## 2022-05-18 DIAGNOSIS — G8929 Other chronic pain: Secondary | ICD-10-CM

## 2022-05-18 DIAGNOSIS — R600 Localized edema: Secondary | ICD-10-CM

## 2022-05-22 NOTE — Therapy (Signed)
OUTPATIENT PHYSICAL THERAPY TREATMENT NOTE   Patient Name: Johnny Bradley MRN: [id number] DOB:1961/05/23, 61 y.o., male Today's Date: 05/23/2022  PCP: [name], MD   REFERRING PROVIDER: Aundra Dubin, PA-C   END OF SESSION:   PT End of Session - 05/23/22 1004     Visit Number 5    Number of Visits 17    Date for PT Re-Evaluation 07/04/22    Authorization Type BCBS / [employer]    PT Start Time 1004    PT Stop Time 1045    PT Time Calculation (min) 41 min    Activity Tolerance Patient tolerated treatment well    Behavior During Therapy WFL for tasks assessed/performed                Past Medical History:  Diagnosis Date   Arthritis    Back pain    Hyperlipidemia    Hypertension    Shoulder pain    Left   Sleep apnea    Past Surgical History:  Procedure Laterality Date   KNEE ARTHROSCOPY Right    MCL Torn   PILONIDAL CYST EXCISION  1980   TOTAL KNEE ARTHROPLASTY Right 04/17/2022   Procedure: RIGHT TOTAL KNEE ARTHROPLASTY;  Surgeon: Leandrew Koyanagi, MD;  Location: [facility];  Service: Orthopedics;  Laterality: Right;   WISDOM TOOTH EXTRACTION     Patient Active Problem List   Diagnosis Date Noted   Status post total left knee replacement 04/17/2022   Primary osteoarthritis of right knee 11/15/2021   Right shoulder pain 11/01/2021   Abdominal discomfort 11/01/2021   Bilateral knee pain 09/15/2020   OSA (obstructive sleep apnea) 02/03/2020   Chronic knee pain 02/03/2020   Family history of angiosarcoma 04/15/2019   Prediabetes 04/14/2019   HLD (hyperlipidemia) 10/02/2018   Subcutaneous nodule of right hand 10/02/2018   Skin lesion 05/29/2018   Hypertension 12/03/2017   Obesity 05/25/2017   Right low back pain 12/19/2014    REFERRING DIAG: Hx of total knee replacement, right  THERAPY DIAG:  Chronic pain of right knee  Stiffness of right knee, not elsewhere classified  Muscle weakness (generalized)  Other abnormalities of gait and  mobility  Localized edema  Rationale for Evaluation and Treatment Rehabilitation  PERTINENT HISTORY: None  PRECAUTIONS: None   SUBJECTIVE: Patient reports if he sits to long then his knee will get stiff and painful when trying to get up.  PAIN:  Are you having pain? Yes:  NPRS scale: 1/10  Pain location: Right knee Pain description: Aching, sore, tight Aggravating factors: Standing, walking, straightening the knee, stairs Relieving factors: Rest, medication   PATIENT GOALS: Progress to walking without device   OBJECTIVE: (objective measures completed at initial evaluation unless otherwise dated) PATIENT SURVEYS:  FOTO 41% functional status   EDEMA:  Circumferential: Not formally assessed. Patient does exhibit right knee and lower leg swelling compared to left   MUSCLE LENGTH: Limitation in calf, hamstring, and quad flexibility   LOWER EXTREMITY ROM:   Active ROM Right eval Right  05/16/22 05/18/22 Right 05/23/2022  Knee flexion 100  105; 108 after recumbent bike  109  Knee extension 10 lacking 7 lacking  Rt lacking 7  Lacking 7    LOWER EXTREMITY MMT:   MMT Right eval Left eval  Hip flexion 4- -  Hip extension 3 -  Hip abduction 3 -  Knee flexion 4 -  Knee extension 4- -    FUNCTIONAL TESTS:  Not assessed  GAIT: - 05/23/2022 Distance walked: 100 ft Assistive device utilized: SPC - lofstrand Level of assistance: Modified independence Comments: limitation in knee extension with stance, minimal flexion of knee with swing, flat foot strike, antalgic on right, heavy reliance on walker for support     TODAY'S TREATMENT: [facility] Adult PT Treatment:                                                DATE: 05/23/22 Therapeutic Exercise: Recumbent bike forward full revolutions x 5 min Slant board calf stretch 3 x 30 sec Seated hamstring stretch with foot on stool 3 x 30 sec Modified thomas stretch with passive knee flexion 3 x 30 sec Supine knee flexion/extension  AROM with physioball x 10 SLR x 10 LAQ x 10 Manual Therapy: Seated and supine knee mobs to improve knee flexion and extension Patellar mobs all directions Seated and supine right knee PROM Gait Training: SPC x 100 ft   [facility] Adult PT Treatment:                                                DATE: 05/18/22 Therapeutic Exercise: Recumbent bike no resistance rocking  Supine heel prop x 5 minutes with 5 lbs and MHP to posterior knee  SAQ 2 x 10  Weight shifts lateral 1 x 10  Manual Therapy: Rt knee PROM to tolerance into flexion and extension Patellar mobilizations all planes grade II-III Passive hamstring stretch Rt knee mobilization A/P grade II-III STM Rt quad   [facility] Adult PT Treatment:                                                DATE: 05/16/22 Therapeutic Exercise: NuStep level 5 x 5 minutes UE/LE  Supine heel prop x 2 minutes with 5 lbs  Supine quad set with towel roll 1 x 10; 10 sec hold  Recumbent bike rocking then backward revolutions x 5 minutes; no resistance  Manual Therapy: Rt knee PROM to tolerance into flexion and extension Patellar mobilizations all planes grade II-III Passive hamstring stretch Rt knee mobilization A/P grade II-III STM Rt quad    PATIENT EDUCATION:  Education details: HEP, progressing to [facility], continued stretching for knee extension Person educated: Patient Education method: Explanation, Demonstration, Tactile cues, Verbal cues, and Handouts Education comprehension: verbalized understanding, returned demonstration, verbal cues required, tactile cues required, and needs further education   HOME EXERCISE PROGRAM: Access Code: [email or web address]     ASSESSMENT: CLINICAL IMPRESSION: Patient tolerated therapy well with no adverse effects. Therapy continues to focus on progressing right knee mobility, strength, and progressing gait with use of cane this visit. He continues to demonstrate limitation mainly in knee extension. He did require cueing for proper  heel toe progression and posture with gait using lostrand cane on left side. Updated HEP to progress stretching to improve knee extension. Patient would benefit from continued skilled PT to progress his mobility and strength to reduce pain and maximize functional ability.     OBJECTIVE IMPAIRMENTS Abnormal gait, decreased activity tolerance, decreased balance, difficulty walking, decreased ROM, decreased strength, increased  edema, impaired flexibility, and pain.    ACTIVITY LIMITATIONS lifting, bending, sitting, standing, squatting, stairs, transfers, toileting, dressing, and locomotion level   PARTICIPATION LIMITATIONS: meal prep, cleaning, driving, shopping, community activity, and yard work   [place], Past/current experiences, and Time since onset of injury/illness/exacerbation are also affecting patient's functional outcome.      GOALS: Goals reviewed with patient? Yes   SHORT TERM GOALS: Target date: 06/06/2022    Patient will be I with initial HEP in order to progress with therapy. Baseline: HEP provided at eval Goal status: INITIAL   2.  PT will review FOTO with patient by 3rd visit in order to understand expected progress and outcome with therapy. Baseline: FOTO assessed at eval Goal status: INITIAL   3.  Patient will demonstrate right knee AROM 3-115 deg in order to improve gait and functional mobility. Baseline: right knee AROM 10-100 deg Goal status: INITIAL   4.  Patient will report right knee pain with activity </= 3/10 in order to reduce functional limitations Baseline: right knee pain 5/10 with activity Goal status: INITIAL   LONG TERM GOALS: Target date: 07/04/2022    Patient will be I with final HEP to maintain progress from PT. Baseline: HEP provided at eval Goal status: INITIAL   2.  Patient will report >/= 60% status on FOTO to indicate improved functional ability. Baseline: 41% functional status Goal status: INITIAL   3.  Patient will  demonstrate right knee AROM 0-120 deg in order to normalize gait and transfers without limitation Baseline:  right knee AROM 10-100 deg Goal status: INITIAL   4.  Patient will demonstrate right knee strength grossly = 5/5 MMT in order to improve walking and stair negotiation Baseline: right knee strength limitation (see above) Goal status: INITIAL   5.  Patient will ambulate community level distances with LRAD in order to improve community access Baseline:patient ambulating household distances with RW Goal status: INITIAL   6.  Patient will report right knee pain with activity </= 1/10 in order to reduce functional limitations Baseline: right knee pain 5/10 with activity Goal status: INITIAL     PLAN: PT FREQUENCY: 1-2x/week   PT DURATION: 8 weeks   PLANNED INTERVENTIONS: Therapeutic exercises, Therapeutic activity, Neuromuscular re-education, Balance training, Gait training, Patient/Family education, Self Care, Joint mobilization, Joint manipulation, Stair training, Aquatic Therapy, Dry Needling, Cryotherapy, Moist heat, Taping, Vasopneumatic device, Manual therapy, and Re-evaluation   PLAN FOR NEXT SESSION: Review HEP and progress PRN, manual/stretching to improve right knee mobility, strength progression, gait training with progression to cane   Hilda Blades, PT, DPT, LAT, ATC 05/23/22  11:00 AM Phone: [phone] Fax: [phone]

## 2022-05-23 ENCOUNTER — Ambulatory Visit: Payer: BC Managed Care – PPO | Attending: Family Medicine | Admitting: Physical Therapy

## 2022-05-23 ENCOUNTER — Encounter: Payer: Self-pay | Admitting: Physical Therapy

## 2022-05-23 ENCOUNTER — Other Ambulatory Visit: Payer: Self-pay

## 2022-05-23 DIAGNOSIS — M25661 Stiffness of right knee, not elsewhere classified: Secondary | ICD-10-CM | POA: Insufficient documentation

## 2022-05-23 DIAGNOSIS — R2689 Other abnormalities of gait and mobility: Secondary | ICD-10-CM | POA: Insufficient documentation

## 2022-05-23 DIAGNOSIS — M6281 Muscle weakness (generalized): Secondary | ICD-10-CM | POA: Insufficient documentation

## 2022-05-23 DIAGNOSIS — R600 Localized edema: Secondary | ICD-10-CM | POA: Diagnosis present

## 2022-05-23 DIAGNOSIS — M25561 Pain in right knee: Secondary | ICD-10-CM | POA: Insufficient documentation

## 2022-05-23 DIAGNOSIS — G8929 Other chronic pain: Secondary | ICD-10-CM | POA: Diagnosis present

## 2022-05-23 NOTE — Patient Instructions (Signed)
Access Code: [email or web address] URL: [email or web address] Date: 05/23/2022 Prepared by: Hilda Blades  Exercises - Long Sitting Calf Stretch with Strap  - 2-3 x daily - 3 sets - 30 seconds hold - Seated Hamstring Stretch  - 2-3 x daily - 3 sets - 30 seconds hold - Supine Knee Extension Mobilization with Weight  - 2-3 x daily - 5 minutes hold - Seated Knee Flexion Stretch  - 2-3 x daily - 10 reps - 5 seconds hold - Supine Heel Slide with Strap  - 2-3 x daily - 10 reps - 5 seconds hold - Active Straight Leg Raise with Quad Set  - 2 x daily - 10 reps - Standing Gastroc Stretch at Counter  - 2 x daily - 3 sets - 30 seconds hold

## 2022-05-24 NOTE — Therapy (Signed)
OUTPATIENT PHYSICAL THERAPY TREATMENT NOTE   Patient Name: Johnny Bradley MRN: [id number] DOB:1960-12-23, 61 y.o., male Today's Date: 05/25/2022  PCP: [name], MD   REFERRING PROVIDER: Aundra Dubin, PA-C   END OF SESSION:   PT End of Session - 05/25/22 1012     Visit Number 6    Number of Visits 17    Date for PT Re-Evaluation 07/04/22    Authorization Type BCBS / [employer]    PT Start Time 1002    PT Stop Time 1045    PT Time Calculation (min) 43 min    Activity Tolerance Patient tolerated treatment well    Behavior During Therapy WFL for tasks assessed/performed                 Past Medical History:  Diagnosis Date   Arthritis    Back pain    Hyperlipidemia    Hypertension    Shoulder pain    Left   Sleep apnea    Past Surgical History:  Procedure Laterality Date   KNEE ARTHROSCOPY Right    MCL Torn   PILONIDAL CYST EXCISION  1980   TOTAL KNEE ARTHROPLASTY Right 04/17/2022   Procedure: RIGHT TOTAL KNEE ARTHROPLASTY;  Surgeon: Leandrew Koyanagi, MD;  Location: [place];  Service: Orthopedics;  Laterality: Right;   WISDOM TOOTH EXTRACTION     Patient Active Problem List   Diagnosis Date Noted   Status post total left knee replacement 04/17/2022   Primary osteoarthritis of right knee 11/15/2021   Right shoulder pain 11/01/2021   Abdominal discomfort 11/01/2021   Bilateral knee pain 09/15/2020   OSA (obstructive sleep apnea) 02/03/2020   Chronic knee pain 02/03/2020   Family history of angiosarcoma 04/15/2019   Prediabetes 04/14/2019   HLD (hyperlipidemia) 10/02/2018   Subcutaneous nodule of right hand 10/02/2018   Skin lesion 05/29/2018   Hypertension 12/03/2017   Obesity 05/25/2017   Right low back pain 12/19/2014    REFERRING DIAG: Hx of total knee replacement, right  THERAPY DIAG:  Chronic pain of right knee  Stiffness of right knee, not elsewhere classified  Muscle weakness (generalized)  Other abnormalities of gait and  mobility  Localized edema  Rationale for Evaluation and Treatment Rehabilitation  PERTINENT HISTORY: None  PRECAUTIONS: None   SUBJECTIVE: Patient reports if his knee is feeling a little stiff today.  PAIN:  Are you having pain? Yes:  NPRS scale: 1/10  Pain location: Right knee Pain description: Aching, sore, tight Aggravating factors: Standing, walking, straightening the knee, stairs Relieving factors: Rest, medication   PATIENT GOALS: Progress to walking without device   OBJECTIVE: (objective measures completed at initial evaluation unless otherwise dated) PATIENT SURVEYS:  FOTO 41% functional status   EDEMA:  Circumferential: Not formally assessed. Patient does exhibit right knee and lower leg swelling compared to left   MUSCLE LENGTH: Limitation in calf, hamstring, and quad flexibility   LOWER EXTREMITY ROM:   Active ROM Right eval Right  05/16/22 05/18/22 Right 05/23/2022  Knee flexion 100  105; 108 after recumbent bike  109  Knee extension 10 lacking 7 lacking  Rt lacking 7  Lacking 7    LOWER EXTREMITY MMT:   MMT Right eval Left eval  Hip flexion 4- -  Hip extension 3 -  Hip abduction 3 -  Knee flexion 4 -  Knee extension 4- -    FUNCTIONAL TESTS:  Not assessed   GAIT: - 05/23/2022 Distance walked: 100 ft Assistive  device utilized: SPC - lofstrand Level of assistance: Modified independence Comments: limitation in knee extension with stance, minimal flexion of knee with swing, flat foot strike, antalgic on right, heavy reliance on walker for support     TODAY'S TREATMENT: [facility] Adult PT Treatment:                                                DATE: 05/25/22 Therapeutic Exercise: NuStep L6 x 5 min with LE while taking subjective Heel prop with MHP to posterior knee x 5 min Seated hamstring stretch with foot on stool 3 x 30 sec Standing calf stretch at wall 3 x 30 sec Supine knee flexion/extension AROM with physioball x 10 SLR 2 x 10 LAQ with 3#  2 x 10 Manual Therapy: Seated and supine knee mobs to improve knee flexion and extension Patellar mobs all directions Seated and supine right knee PROM   [facility] Adult PT Treatment:                                                DATE: 05/23/22 Therapeutic Exercise: Recumbent bike forward full revolutions x 5 min Slant board calf stretch 3 x 30 sec Seated hamstring stretch with foot on stool 3 x 30 sec Modified thomas stretch with passive knee flexion 3 x 30 sec Supine knee flexion/extension AROM with physioball x 10 SLR x 10 LAQ x 10 Manual Therapy: Seated and supine knee mobs to improve knee flexion and extension Patellar mobs all directions Seated and supine right knee PROM Gait Training: SPC x 100 ft  [facility] Adult PT Treatment:                                                DATE: 05/18/22 Therapeutic Exercise: Recumbent bike no resistance rocking  Supine heel prop x 5 minutes with 5 lbs and MHP to posterior knee  SAQ 2 x 10  Weight shifts lateral 1 x 10  Manual Therapy: Rt knee PROM to tolerance into flexion and extension Patellar mobilizations all planes grade II-III Passive hamstring stretch Rt knee mobilization A/P grade II-III STM Rt quad    PATIENT EDUCATION:  Education details: HEP Person educated: Patient Education method: Consulting civil engineer, Media planner, Corporate treasurer cues, Verbal cues, and Handouts Education comprehension: verbalized understanding, returned demonstration, verbal cues required, tactile cues required, and needs further education   HOME EXERCISE PROGRAM: Access Code: [email or web address]     ASSESSMENT: CLINICAL IMPRESSION: Patient tolerated therapy well with no adverse effects. Therapy focused on improving knee mobility and strength with good tolerance. He is using a cane without any issue. He continues to demonstrate limitation in knee motion but is tolerating increased resistance with light strengthening. No changes to HEP this visit. Patient would benefit from  continued skilled PT to progress his mobility and strength to reduce pain and maximize functional ability.     OBJECTIVE IMPAIRMENTS Abnormal gait, decreased activity tolerance, decreased balance, difficulty walking, decreased ROM, decreased strength, increased edema, impaired flexibility, and pain.    ACTIVITY LIMITATIONS lifting, bending, sitting, standing, squatting, stairs, transfers, toileting, dressing, and locomotion level  PARTICIPATION LIMITATIONS: meal prep, cleaning, driving, shopping, community activity, and yard work   [place], Past/current experiences, and Time since onset of injury/illness/exacerbation are also affecting patient's functional outcome.      GOALS: Goals reviewed with patient? Yes   SHORT TERM GOALS: Target date: 06/06/2022    Patient will be I with initial HEP in order to progress with therapy. Baseline: HEP provided at eval Goal status: INITIAL   2.  PT will review FOTO with patient by 3rd visit in order to understand expected progress and outcome with therapy. Baseline: FOTO assessed at eval Goal status: INITIAL   3.  Patient will demonstrate right knee AROM 3-115 deg in order to improve gait and functional mobility. Baseline: right knee AROM 10-100 deg Goal status: INITIAL   4.  Patient will report right knee pain with activity </= 3/10 in order to reduce functional limitations Baseline: right knee pain 5/10 with activity Goal status: INITIAL   LONG TERM GOALS: Target date: 07/04/2022    Patient will be I with final HEP to maintain progress from PT. Baseline: HEP provided at eval Goal status: INITIAL   2.  Patient will report >/= 60% status on FOTO to indicate improved functional ability. Baseline: 41% functional status Goal status: INITIAL   3.  Patient will demonstrate right knee AROM 0-120 deg in order to normalize gait and transfers without limitation Baseline:  right knee AROM 10-100 deg Goal status: INITIAL   4.   Patient will demonstrate right knee strength grossly = 5/5 MMT in order to improve walking and stair negotiation Baseline: right knee strength limitation (see above) Goal status: INITIAL   5.  Patient will ambulate community level distances with LRAD in order to improve community access Baseline:patient ambulating household distances with RW Goal status: INITIAL   6.  Patient will report right knee pain with activity </= 1/10 in order to reduce functional limitations Baseline: right knee pain 5/10 with activity Goal status: INITIAL     PLAN: PT FREQUENCY: 1-2x/week   PT DURATION: 8 weeks   PLANNED INTERVENTIONS: Therapeutic exercises, Therapeutic activity, Neuromuscular re-education, Balance training, Gait training, Patient/Family education, Self Care, Joint mobilization, Joint manipulation, Stair training, Aquatic Therapy, Dry Needling, Cryotherapy, Moist heat, Taping, Vasopneumatic device, Manual therapy, and Re-evaluation   PLAN FOR NEXT SESSION: Assess FOTO, review HEP and progress PRN, manual/stretching to improve right knee mobility, strength progression, gait training with progression to cane   Hilda Blades, PT, DPT, LAT, ATC 05/25/22  12:14 PM Phone: [phone] Fax: [phone]

## 2022-05-25 ENCOUNTER — Other Ambulatory Visit: Payer: Self-pay | Admitting: Family Medicine

## 2022-05-25 ENCOUNTER — Encounter: Payer: Self-pay | Admitting: Physical Therapy

## 2022-05-25 ENCOUNTER — Other Ambulatory Visit: Payer: Self-pay

## 2022-05-25 ENCOUNTER — Ambulatory Visit: Payer: BC Managed Care – PPO | Admitting: Physical Therapy

## 2022-05-25 DIAGNOSIS — M25661 Stiffness of right knee, not elsewhere classified: Secondary | ICD-10-CM

## 2022-05-25 DIAGNOSIS — G8929 Other chronic pain: Secondary | ICD-10-CM

## 2022-05-25 DIAGNOSIS — R600 Localized edema: Secondary | ICD-10-CM

## 2022-05-25 DIAGNOSIS — M25561 Pain in right knee: Secondary | ICD-10-CM | POA: Diagnosis not present

## 2022-05-25 DIAGNOSIS — M6281 Muscle weakness (generalized): Secondary | ICD-10-CM

## 2022-05-25 DIAGNOSIS — R2689 Other abnormalities of gait and mobility: Secondary | ICD-10-CM

## 2022-05-29 ENCOUNTER — Ambulatory Visit: Payer: BC Managed Care – PPO

## 2022-05-29 DIAGNOSIS — M25561 Pain in right knee: Secondary | ICD-10-CM | POA: Diagnosis not present

## 2022-05-29 DIAGNOSIS — M25661 Stiffness of right knee, not elsewhere classified: Secondary | ICD-10-CM

## 2022-05-29 DIAGNOSIS — R2689 Other abnormalities of gait and mobility: Secondary | ICD-10-CM

## 2022-05-29 DIAGNOSIS — M6281 Muscle weakness (generalized): Secondary | ICD-10-CM

## 2022-05-29 DIAGNOSIS — G8929 Other chronic pain: Secondary | ICD-10-CM

## 2022-05-29 DIAGNOSIS — R600 Localized edema: Secondary | ICD-10-CM

## 2022-05-29 NOTE — Therapy (Signed)
OUTPATIENT PHYSICAL THERAPY TREATMENT NOTE   Patient Name: Johnny Bradley MRN: [id number] DOB:May 07, 1961, 61 y.o., male Today's Date: 05/29/2022  PCP: [name], MD   REFERRING PROVIDER: Aundra Dubin, PA-C   END OF SESSION:   PT End of Session - 05/29/22 1155     Visit Number 7    Number of Visits 17    Date for PT Re-Evaluation 07/04/22    Authorization Type BCBS / [employer]    PT Start Time 1151    PT Stop Time 1233    PT Time Calculation (min) 42 min    Activity Tolerance Patient tolerated treatment well    Behavior During Therapy WFL for tasks assessed/performed                  Past Medical History:  Diagnosis Date   Arthritis    Back pain    Hyperlipidemia    Hypertension    Shoulder pain    Left   Sleep apnea    Past Surgical History:  Procedure Laterality Date   KNEE ARTHROSCOPY Right    MCL Torn   PILONIDAL CYST EXCISION  1980   TOTAL KNEE ARTHROPLASTY Right 04/17/2022   Procedure: RIGHT TOTAL KNEE ARTHROPLASTY;  Surgeon: Leandrew Koyanagi, MD;  Location: [place];  Service: Orthopedics;  Laterality: Right;   WISDOM TOOTH EXTRACTION     Patient Active Problem List   Diagnosis Date Noted   Status post total left knee replacement 04/17/2022   Primary osteoarthritis of right knee 11/15/2021   Right shoulder pain 11/01/2021   Abdominal discomfort 11/01/2021   Bilateral knee pain 09/15/2020   OSA (obstructive sleep apnea) 02/03/2020   Chronic knee pain 02/03/2020   Family history of angiosarcoma 04/15/2019   Prediabetes 04/14/2019   HLD (hyperlipidemia) 10/02/2018   Subcutaneous nodule of right hand 10/02/2018   Skin lesion 05/29/2018   Hypertension 12/03/2017   Obesity 05/25/2017   Right low back pain 12/19/2014    REFERRING DIAG: Hx of total knee replacement, right  THERAPY DIAG:  Chronic pain of right knee  Stiffness of right knee, not elsewhere classified  Muscle weakness (generalized)  Other abnormalities of gait and  mobility  Localized edema  Rationale for Evaluation and Treatment Rehabilitation  PERTINENT HISTORY: None  PRECAUTIONS: None   SUBJECTIVE: Patient reports his knee is feeling ok right now without reports of pain. He has f/u with surgeon tomorrow.   PAIN:  Are you having pain? No  PATIENT GOALS: Progress to walking without device   OBJECTIVE: (objective measures completed at initial evaluation unless otherwise dated) PATIENT SURVEYS:  FOTO 41% functional status   EDEMA:  Circumferential: Not formally assessed. Patient does exhibit right knee and lower leg swelling compared to left   MUSCLE LENGTH: Limitation in calf, hamstring, and quad flexibility   LOWER EXTREMITY ROM:   Active ROM Right eval Right  05/16/22 05/18/22 Right 05/23/2022 05/29/22 Right  Knee flexion 100  105; 108 after recumbent bike  109 108  Knee extension 10 lacking 7 lacking  Rt lacking 7  Lacking 7 Lacking 7    LOWER EXTREMITY MMT:   MMT Right eval Left eval  Hip flexion 4- -  Hip extension 3 -  Hip abduction 3 -  Knee flexion 4 -  Knee extension 4- -    FUNCTIONAL TESTS:  Not assessed   GAIT: - 05/23/2022 Distance walked: 100 ft Assistive device utilized: SPC - lofstrand Level of assistance: Modified independence Comments: limitation  in knee extension with stance, minimal flexion of knee with swing, flat foot strike, antalgic on right, heavy reliance on walker for support     TODAY'S TREATMENT: [facility] Adult PT Treatment:                                                DATE: 05/29/22 Therapeutic Exercise: Recumbent bike level 2 x 5 minutes; fwd revolutions  Prone quad stretch 5 x 10 sec hold SLR 2 x 10  TKE blue band 2 x 10 Seated HS curl 2 x 10; green band  Updated HEP Manual Therapy: Rt knee flexion/extension PROM to tolerance Patellar mobilizations all planes grade III-IV Rt knee joint mobilizations to improve flexion/extension    [facility] Adult PT Treatment:                                                 DATE: 05/25/22 Therapeutic Exercise: NuStep L6 x 5 min with LE while taking subjective Heel prop with MHP to posterior knee x 5 min Seated hamstring stretch with foot on stool 3 x 30 sec Standing calf stretch at wall 3 x 30 sec Supine knee flexion/extension AROM with physioball x 10 SLR 2 x 10 LAQ with 3# 2 x 10 Manual Therapy: Seated and supine knee mobs to improve knee flexion and extension Patellar mobs all directions Seated and supine right knee PROM   [facility] Adult PT Treatment:                                                DATE: 05/23/22 Therapeutic Exercise: Recumbent bike forward full revolutions x 5 min Slant board calf stretch 3 x 30 sec Seated hamstring stretch with foot on stool 3 x 30 sec Modified thomas stretch with passive knee flexion 3 x 30 sec Supine knee flexion/extension AROM with physioball x 10 SLR x 10 LAQ x 10 Manual Therapy: Seated and supine knee mobs to improve knee flexion and extension Patellar mobs all directions Seated and supine right knee PROM Gait Training: SPC x 100 ft     PATIENT EDUCATION:  Education details: HEP Person educated: Patient Education method: Consulting civil engineer, Media planner, Corporate treasurer cues, Verbal cues, and Handouts Education comprehension: verbalized understanding, returned demonstration, verbal cues required, tactile cues required   HOME EXERCISE PROGRAM: Access Code: [email or web address]     ASSESSMENT: CLINICAL IMPRESSION: Patient continues to have limited knee flexion and extension AROM despite aggressive manual therapy and ther ex aimed at improving ROM. His flexion has modestly improved compared to initial evaluation, but extension remains relatively unchanged. He tolerated session well today focusing on continued progression of knee ROM and strengthening without reports of pain.      OBJECTIVE IMPAIRMENTS Abnormal gait, decreased activity tolerance, decreased balance, difficulty walking, decreased ROM, decreased  strength, increased edema, impaired flexibility, and pain.    ACTIVITY LIMITATIONS lifting, bending, sitting, standing, squatting, stairs, transfers, toileting, dressing, and locomotion level   PARTICIPATION LIMITATIONS: meal prep, cleaning, driving, shopping, community activity, and yard work   [place], Past/current experiences, and Time since onset of injury/illness/exacerbation are also affecting  patient's functional outcome.      GOALS: Goals reviewed with patient? Yes   SHORT TERM GOALS: Target date: 06/06/2022    Patient will be I with initial HEP in order to progress with therapy. Baseline: HEP provided at eval Goal status: INITIAL   2.  PT will review FOTO with patient by 3rd visit in order to understand expected progress and outcome with therapy. Baseline: FOTO assessed at eval Goal status: INITIAL   3.  Patient will demonstrate right knee AROM 3-115 deg in order to improve gait and functional mobility. Baseline: right knee AROM 10-100 deg Goal status: INITIAL   4.  Patient will report right knee pain with activity </= 3/10 in order to reduce functional limitations Baseline: right knee pain 5/10 with activity Goal status: INITIAL   LONG TERM GOALS: Target date: 07/04/2022    Patient will be I with final HEP to maintain progress from PT. Baseline: HEP provided at eval Goal status: INITIAL   2.  Patient will report >/= 60% status on FOTO to indicate improved functional ability. Baseline: 41% functional status Goal status: INITIAL   3.  Patient will demonstrate right knee AROM 0-120 deg in order to normalize gait and transfers without limitation Baseline:  right knee AROM 10-100 deg Goal status: INITIAL   4.  Patient will demonstrate right knee strength grossly = 5/5 MMT in order to improve walking and stair negotiation Baseline: right knee strength limitation (see above) Goal status: INITIAL   5.  Patient will ambulate community level distances  with LRAD in order to improve community access Baseline:patient ambulating household distances with RW Goal status: INITIAL   6.  Patient will report right knee pain with activity </= 1/10 in order to reduce functional limitations Baseline: right knee pain 5/10 with activity Goal status: INITIAL     PLAN: PT FREQUENCY: 1-2x/week   PT DURATION: 8 weeks   PLANNED INTERVENTIONS: Therapeutic exercises, Therapeutic activity, Neuromuscular re-education, Balance training, Gait training, Patient/Family education, Self Care, Joint mobilization, Joint manipulation, Stair training, Aquatic Therapy, Dry Needling, Cryotherapy, Moist heat, Taping, Vasopneumatic device, Manual therapy, and Re-evaluation   PLAN FOR NEXT SESSION:  review HEP and progress PRN, manual/stretching to improve right knee mobility, strength progression, gait training Gwendolyn Grant, PT, DPT, ATC 05/29/22 12:48 PM

## 2022-05-30 ENCOUNTER — Ambulatory Visit (INDEPENDENT_AMBULATORY_CARE_PROVIDER_SITE_OTHER): Payer: BC Managed Care – PPO

## 2022-05-30 ENCOUNTER — Encounter: Payer: Self-pay | Admitting: Orthopaedic Surgery

## 2022-05-30 ENCOUNTER — Ambulatory Visit (INDEPENDENT_AMBULATORY_CARE_PROVIDER_SITE_OTHER): Payer: BC Managed Care – PPO | Admitting: Orthopaedic Surgery

## 2022-05-30 DIAGNOSIS — Z96651 Presence of right artificial knee joint: Secondary | ICD-10-CM | POA: Diagnosis not present

## 2022-05-30 MED ORDER — CEFADROXIL 500 MG PO CAPS: 500.0000 mg | ORAL_CAPSULE | Freq: Two times a day (BID) | ORAL | 0 refills | Status: AC

## 2022-05-30 NOTE — Progress Notes (Signed)
Post-Op Visit Note   Patient: Johnny Bradley           Date of Birth: 24-Dec-1960           MRN: [id number] Visit Date: 05/30/2022 PCP: [name], MD   Assessment & Plan:  Chief Complaint:  Chief Complaint  Patient presents with   Right Knee - Follow-up    Right total knee arthroplasty 04/17/2022   Visit Diagnoses:  1. Hx of total knee replacement, right     Plan: Johnny Bradley is here for 6-week postop visit from a right total knee on 04/17/2022.  He is walking with a single-point cane.  He continues to do outpatient PT twice a week.  No real complaints.  Examination of right knee shows a fully healed surgical scar with some local erythema.  There is a small area where look like the suture erupted.  No signs of infection.  Range of motion is adequate.  X-ray showed stable implant without any complications.  Overall Jerimy is doing well from his surgery and from his PT.  Would like to prophylactically put him on 10 days of cefadroxil.  Dental prophylaxis reinforced.  Recheck in 6 weeks.  Follow-Up Instructions: Return in about 6 weeks (around 07/11/2022).   Orders:  Orders Placed This Encounter  Procedures   XR Knee 1-2 Views Right   Meds ordered this encounter  Medications   cefadroxil (DURICEF) 500 MG capsule    Sig: Take 1 capsule (500 mg total) by mouth 2 (two) times daily for 10 days.    Dispense:  20 capsule    Refill:  0    Imaging: XR Knee 1-2 Views Right  Result Date: 05/30/2022 Stable total knee replacement in good alignment    PMFS History: Patient Active Problem List   Diagnosis Date Noted   Status post total left knee replacement 04/17/2022   Primary osteoarthritis of right knee 11/15/2021   Right shoulder pain 11/01/2021   Abdominal discomfort 11/01/2021   Bilateral knee pain 09/15/2020   OSA (obstructive sleep apnea) 02/03/2020   Chronic knee pain 02/03/2020   Family history of angiosarcoma 04/15/2019   Prediabetes 04/14/2019   HLD  (hyperlipidemia) 10/02/2018   Subcutaneous nodule of right hand 10/02/2018   Skin lesion 05/29/2018   Hypertension 12/03/2017   Obesity 05/25/2017   Right low back pain 12/19/2014   Past Medical History:  Diagnosis Date   Arthritis    Back pain    Hyperlipidemia    Hypertension    Shoulder pain    Left   Sleep apnea     Family History  Problem Relation Age of Onset   Heart disease Father    Stroke Paternal Grandmother     Past Surgical History:  Procedure Laterality Date   KNEE ARTHROSCOPY Right    MCL Torn   PILONIDAL CYST EXCISION  1980   TOTAL KNEE ARTHROPLASTY Right 04/17/2022   Procedure: RIGHT TOTAL KNEE ARTHROPLASTY;  Surgeon: Leandrew Koyanagi, MD;  Location: [place];  Service: Orthopedics;  Laterality: Right;   WISDOM TOOTH EXTRACTION     Social History   Occupational History   Not on file  Tobacco Use   Smoking status: Never   Smokeless tobacco: Never  Vaping Use   Vaping Use: Never used  Substance and Sexual Activity   Alcohol use: Yes    Alcohol/week: 12.0 standard drinks of alcohol    Types: 12 Cans of beer per week    Comment: On weekends-  [employer] [address]    Drug use: No   Sexual activity: Yes    Partners: Female    Comment: Wife

## 2022-05-31 ENCOUNTER — Other Ambulatory Visit: Payer: Self-pay

## 2022-05-31 ENCOUNTER — Ambulatory Visit: Payer: BC Managed Care – PPO | Admitting: Physical Therapy

## 2022-05-31 ENCOUNTER — Encounter: Payer: Self-pay | Admitting: Physical Therapy

## 2022-05-31 DIAGNOSIS — R600 Localized edema: Secondary | ICD-10-CM

## 2022-05-31 DIAGNOSIS — G8929 Other chronic pain: Secondary | ICD-10-CM

## 2022-05-31 DIAGNOSIS — M25561 Pain in right knee: Secondary | ICD-10-CM | POA: Diagnosis not present

## 2022-05-31 DIAGNOSIS — M25661 Stiffness of right knee, not elsewhere classified: Secondary | ICD-10-CM

## 2022-05-31 DIAGNOSIS — R2689 Other abnormalities of gait and mobility: Secondary | ICD-10-CM

## 2022-05-31 DIAGNOSIS — M6281 Muscle weakness (generalized): Secondary | ICD-10-CM

## 2022-05-31 NOTE — Therapy (Signed)
OUTPATIENT PHYSICAL THERAPY TREATMENT NOTE   Patient Name: Johnny Bradley MRN: [id number] DOB:[birth year], 61 y.o., male Today's Date: 05/31/2022  PCP: [name], MD   REFERRING PROVIDER: Aundra Dubin, PA-C   END OF SESSION:   PT End of Session - 05/31/22 1000     Visit Number 8    Number of Visits 17    Date for PT Re-Evaluation 07/04/22    Authorization Type BCBS / [employer]    PT Start Time 1000    PT Stop Time 1040    PT Time Calculation (min) 40 min    Activity Tolerance Patient tolerated treatment well    Behavior During Therapy WFL for tasks assessed/performed                   Past Medical History:  Diagnosis Date   Arthritis    Back pain    Hyperlipidemia    Hypertension    Shoulder pain    Left   Sleep apnea    Past Surgical History:  Procedure Laterality Date   KNEE ARTHROSCOPY Right    MCL Torn   PILONIDAL CYST EXCISION  1980   TOTAL KNEE ARTHROPLASTY Right 04/17/2022   Procedure: RIGHT TOTAL KNEE ARTHROPLASTY;  Surgeon: Leandrew Koyanagi, MD;  Location: [facility];  Service: Orthopedics;  Laterality: Right;   WISDOM TOOTH EXTRACTION     Patient Active Problem List   Diagnosis Date Noted   Status post total left knee replacement 04/17/2022   Primary osteoarthritis of right knee 11/15/2021   Right shoulder pain 11/01/2021   Abdominal discomfort 11/01/2021   Bilateral knee pain 09/15/2020   OSA (obstructive sleep apnea) 02/03/2020   Chronic knee pain 02/03/2020   Family history of angiosarcoma 04/15/2019   Prediabetes 04/14/2019   HLD (hyperlipidemia) 10/02/2018   Subcutaneous nodule of right hand 10/02/2018   Skin lesion 05/29/2018   Hypertension 12/03/2017   Obesity 05/25/2017   Right low back pain 12/19/2014    REFERRING DIAG: Hx of total knee replacement, right  THERAPY DIAG:  Chronic pain of right knee  Stiffness of right knee, not elsewhere classified  Muscle weakness (generalized)  Other abnormalities of gait and  mobility  Localized edema  Rationale for Evaluation and Treatment Rehabilitation  PERTINENT HISTORY: None  PRECAUTIONS: None   SUBJECTIVE: Patient reports knee is feeling good. He saw the surgeon and they said everything looked good and to continue with therapy. He drove today for the first time.   PAIN:  Are you having pain? No  PATIENT GOALS: Progress to walking without device   OBJECTIVE: (objective measures completed at initial evaluation unless otherwise dated) PATIENT SURVEYS:  FOTO 41% functional status   EDEMA:  Circumferential: Not formally assessed. Patient does exhibit right knee and lower leg swelling compared to left   MUSCLE LENGTH: Limitation in calf, hamstring, and quad flexibility   LOWER EXTREMITY ROM:   Active ROM Right eval Right  05/16/22 05/18/22 Right 05/23/2022 05/29/22 Right  Knee flexion 100  105; 108 after recumbent bike  109 108  Knee extension 10 lacking 7 lacking  Rt lacking 7  Lacking 7 Lacking 7    LOWER EXTREMITY MMT:   MMT Right eval Left eval  Hip flexion 4- -  Hip extension 3 -  Hip abduction 3 -  Knee flexion 4 -  Knee extension 4- -    FUNCTIONAL TESTS:  Not assessed   GAIT: - 05/23/2022 Distance walked: 100 ft Assistive device utilized:  SPC - lofstrand Level of assistance: Modified independence Comments: limitation in knee extension with stance, minimal flexion of knee with swing, flat foot strike, antalgic on right, heavy reliance on walker for support     TODAY'S TREATMENT: [facility] Adult PT Treatment:                                                DATE: 05/31/22 Therapeutic Exercise: Recumbent bike L1 x 5 min, initially reverse then forward revolutions Slant board calf stretch 2 x 60 sec Squat with FM bar support x 10 Seated hamstring stretch with heel on stool  2 x 60 sec  LAQ with 5# 2 x 10 Standing TKE with blue x 20 Standing heel raises 2 x 20 Forward 4" step up 2 x 10 SLR 2 x 10  Sidelying hip abduction 2 x  10 Seated hamstring curl with green 2 x 10   [facility] Adult PT Treatment:                                                DATE: 05/29/22 Therapeutic Exercise: Recumbent bike level 2 x 5 minutes; fwd revolutions  Prone quad stretch 5 x 10 sec hold SLR 2 x 10  TKE blue band 2 x 10 Seated HS curl 2 x 10; green band  Updated HEP Manual Therapy: Rt knee flexion/extension PROM to tolerance Patellar mobilizations all planes grade III-IV Rt knee joint mobilizations to improve flexion/extension  [facility] Adult PT Treatment:                                                DATE: 05/25/22 Therapeutic Exercise: NuStep L6 x 5 min with LE while taking subjective Heel prop with MHP to posterior knee x 5 min Seated hamstring stretch with foot on stool 3 x 30 sec Standing calf stretch at wall 3 x 30 sec Supine knee flexion/extension AROM with physioball x 10 SLR 2 x 10 LAQ with 3# 2 x 10 Manual Therapy: Seated and supine knee mobs to improve knee flexion and extension Patellar mobs all directions Seated and supine right knee PROM  PATIENT EDUCATION:  Education details: HEP Person educated: Patient Education method: Consulting civil engineer, Demonstration, Corporate treasurer cues, Verbal cues Education comprehension: verbalized understanding, returned demonstration, verbal cues required, tactile cues required   HOME EXERCISE PROGRAM: Access Code: [email or web address]     ASSESSMENT: CLINICAL IMPRESSION: Patient tolerated therapy well with no adverse effects. Therapy focused primarily on therex to progress knee mobility and strengthening. Progressed standing strengthening and resistance with exercises with good tolerance. He continues to exhibit gross limitation with knee motion and knee and hip strength. He requires cueing for proper exercise technique throughout session. No changes to HEP this visit. Patient would benefit from continued skilled PT to progress his mobility and strength to reduce pain and maximize functional ability.      OBJECTIVE IMPAIRMENTS Abnormal gait, decreased activity tolerance, decreased balance, difficulty walking, decreased ROM, decreased strength, increased edema, impaired flexibility, and pain.    ACTIVITY LIMITATIONS lifting, bending, sitting, standing, squatting, stairs, transfers, toileting, dressing, and locomotion level  PARTICIPATION LIMITATIONS: meal prep, cleaning, driving, shopping, community activity, and yard work   [place], Past/current experiences, and Time since onset of injury/illness/exacerbation are also affecting patient's functional outcome.      GOALS: Goals reviewed with patient? Yes   SHORT TERM GOALS: Target date: 06/06/2022    Patient will be I with initial HEP in order to progress with therapy. Baseline: HEP provided at eval Goal status: INITIAL   2.  PT will review FOTO with patient by 3rd visit in order to understand expected progress and outcome with therapy. Baseline: FOTO assessed at eval Goal status: INITIAL   3.  Patient will demonstrate right knee AROM 3-115 deg in order to improve gait and functional mobility. Baseline: right knee AROM 10-100 deg Goal status: INITIAL   4.  Patient will report right knee pain with activity </= 3/10 in order to reduce functional limitations Baseline: right knee pain 5/10 with activity Goal status: INITIAL   LONG TERM GOALS: Target date: 07/04/2022    Patient will be I with final HEP to maintain progress from PT. Baseline: HEP provided at eval Goal status: INITIAL   2.  Patient will report >/= 60% status on FOTO to indicate improved functional ability. Baseline: 41% functional status Goal status: INITIAL   3.  Patient will demonstrate right knee AROM 0-120 deg in order to normalize gait and transfers without limitation Baseline:  right knee AROM 10-100 deg Goal status: INITIAL   4.  Patient will demonstrate right knee strength grossly = 5/5 MMT in order to improve walking and stair  negotiation Baseline: right knee strength limitation (see above) Goal status: INITIAL   5.  Patient will ambulate community level distances with LRAD in order to improve community access Baseline:patient ambulating household distances with RW Goal status: INITIAL   6.  Patient will report right knee pain with activity </= 1/10 in order to reduce functional limitations Baseline: right knee pain 5/10 with activity Goal status: INITIAL     PLAN: PT FREQUENCY: 1-2x/week   PT DURATION: 8 weeks   PLANNED INTERVENTIONS: Therapeutic exercises, Therapeutic activity, Neuromuscular re-education, Balance training, Gait training, Patient/Family education, Self Care, Joint mobilization, Joint manipulation, Stair training, Aquatic Therapy, Dry Needling, Cryotherapy, Moist heat, Taping, Vasopneumatic device, Manual therapy, and Re-evaluation   PLAN FOR NEXT SESSION:  Reassess STGs and FOTO, review HEP and progress PRN, manual/stretching to improve right knee mobility, strength progression, gait training   Hilda Blades, PT, DPT, LAT, ATC 05/31/22  12:48 PM Phone: [phone] Fax: [phone]

## 2022-06-05 NOTE — Therapy (Signed)
OUTPATIENT PHYSICAL THERAPY TREATMENT NOTE   Patient Name: Johnny Bradley MRN: [id number] DOB:09-Jul-1961, 61 y.o., male Today's Date: 06/06/2022  PCP: [name], MD   REFERRING PROVIDER: Aundra Dubin, PA-C   END OF SESSION:   PT End of Session - 06/06/22 1053     Visit Number 9    Number of Visits 17    Date for PT Re-Evaluation 07/04/22    Authorization Type [employer]BS / [employer]    PT Start Time 1050    PT Stop Time 1130    PT Time Calculation (min) 40 min    Activity Tolerance Patient tolerated treatment well    Behavior During Therapy WFL for tasks assessed/performed                    Past Medical History:  Diagnosis Date   Arthritis    Back pain    Hyperlipidemia    Hypertension    Shoulder pain    Left   Sleep apnea    Past Surgical History:  Procedure Laterality Date   KNEE ARTHROSCOPY Right    MCL Torn   PILONIDAL CYST EXCISION  1980   TOTAL KNEE ARTHROPLASTY Right 04/17/2022   Procedure: RIGHT TOTAL KNEE ARTHROPLASTY;  Surgeon: Leandrew Koyanagi, MD;  Location: [facility];  Service: Orthopedics;  Laterality: Right;   WISDOM TOOTH EXTRACTION     Patient Active Problem List   Diagnosis Date Noted   Status post total left knee replacement 04/17/2022   Primary osteoarthritis of right knee 11/15/2021   Right shoulder pain 11/01/2021   Abdominal discomfort 11/01/2021   Bilateral knee pain 09/15/2020   OSA (obstructive sleep apnea) 02/03/2020   Chronic knee pain 02/03/2020   Family history of angiosarcoma 04/15/2019   Prediabetes 04/14/2019   HLD (hyperlipidemia) 10/02/2018   Subcutaneous nodule of right hand 10/02/2018   Skin lesion 05/29/2018   Hypertension 12/03/2017   Obesity 05/25/2017   Right low back pain 12/19/2014    REFERRING DIAG: Hx of total knee replacement, right  THERAPY DIAG:  Chronic pain of right knee  Stiffness of right knee, not elsewhere classified  Muscle weakness (generalized)  Other abnormalities of gait and  mobility  Localized edema  Rationale for Evaluation and Treatment Rehabilitation  PERTINENT HISTORY: None  PRECAUTIONS: None   SUBJECTIVE: Patient reports he is doing well. He is walking the neighborhood which is about 1/2 mile and he can do about half of that without the cane.   PAIN:  Are you having pain? No  PATIENT GOALS: Progress to walking without device   OBJECTIVE: (objective measures completed at initial evaluation unless otherwise dated) PATIENT SURVEYS:  FOTO 41% functional status  06/06/2022: 62% - met goal   EDEMA:  Circumferential: Not formally assessed. Patient does exhibit right knee and lower leg swelling compared to left   MUSCLE LENGTH: Limitation in calf, hamstring, and quad flexibility   LOWER EXTREMITY ROM:   Active ROM Right eval Right  05/29/22 Right 06/06/2022 Right  Knee flexion 100  108 112  Knee extension 10 lacking 7 lacking Lacking 7 Lacking 7    LOWER EXTREMITY MMT:   MMT Right eval Left eval  Hip flexion 4- -  Hip extension 3 -  Hip abduction 3 -  Knee flexion 4 -  Knee extension 4- -    FUNCTIONAL TESTS:  Not assessed   GAIT: - 05/23/2022 Distance walked: 100 ft Assistive device utilized: SPC - lofstrand Level of assistance: Modified  independence Comments: limitation in knee extension with stance, minimal flexion of knee with swing, flat foot strike, antalgic on right, heavy reliance on walker for support     TODAY'S TREATMENT: [facility] Adult PT Treatment:                                                DATE: 06/06/22 Therapeutic Exercise: Recumbent bike L3 x 4 min while taking subjective Seated hamstring stretch with heel on stool 3 x 30 sec Slant board calf stretch 3 x 30 sec Squat with TRX support x 10 Knee extension machine 25# 3 x 10 Hamstring curl machine 35# 3 x 10 Standing heel raises 2 x 20 Forward 6" step up 2 x 10 Sidelying hip abduction 2 x 15 Sit to stand x 10   [facility] Adult PT Treatment:                                                 DATE: 05/31/22 Therapeutic Exercise: Recumbent bike L1 x 5 min, initially reverse then forward revolutions Slant board calf stretch 2 x 60 sec Squat with FM bar support x 10 Seated hamstring stretch with heel on stool  2 x 60 sec  LAQ with 5# 2 x 10 Standing TKE with blue x 20 Standing heel raises 2 x 20 Forward 4" step up 2 x 10 SLR 2 x 10  Sidelying hip abduction 2 x 10 Seated hamstring curl with green 2 x 10  [facility] Adult PT Treatment:                                                DATE: 05/29/22 Therapeutic Exercise: Recumbent bike level 2 x 5 minutes; fwd revolutions  Prone quad stretch 5 x 10 sec hold SLR 2 x 10  TKE blue band 2 x 10 Seated HS curl 2 x 10; green band  Updated HEP Manual Therapy: Rt knee flexion/extension PROM to tolerance Patellar mobilizations all planes grade III-IV Rt knee joint mobilizations to improve flexion/extension  PATIENT EDUCATION:  Education details: HEP update Person educated: Patient Education method: Explanation, Demonstration, Tactile cues, Verbal cues, Handout Education comprehension: verbalized understanding, returned demonstration, verbal cues required, tactile cues required   HOME EXERCISE PROGRAM: Access Code: [email or web address]     ASSESSMENT: CLINICAL IMPRESSION: Patient tolerated therapy well with no adverse effects. Therapy focused primarily on progression of his strengthening this visit with good tolerance. He notes an improvement in walking and functional ability on FOTO, but does still exhibit limitations in right knee motion. He seems to be progressing well with strengthening and updated HEP with good tolerance. Patient would benefit from continued skilled PT to progress his mobility and strength to reduce pain and maximize functional ability.     OBJECTIVE IMPAIRMENTS Abnormal gait, decreased activity tolerance, decreased balance, difficulty walking, decreased ROM, decreased strength, increased edema,  impaired flexibility, and pain.    ACTIVITY LIMITATIONS lifting, bending, sitting, standing, squatting, stairs, transfers, toileting, dressing, and locomotion level   PARTICIPATION LIMITATIONS: meal prep, cleaning, driving, shopping, community activity, and yard work   PERSONAL FACTORS  [place], Past/current experiences, and Time since onset of injury/illness/exacerbation are also affecting patient's functional outcome.      GOALS: Goals reviewed with patient? Yes   SHORT TERM GOALS: Target date: 06/06/2022    Patient will be I with initial HEP in order to progress with therapy. Baseline: HEP provided at eval 06/06/2022: Goal status: MET   2.  PT will review FOTO with patient by 3rd visit in order to understand expected progress and outcome with therapy. Baseline: FOTO assessed at eval 06/06/2022: reviewed and reassessed Goal status: MET   3.  Patient will demonstrate right knee AROM 3-115 deg in order to improve gait and functional mobility. Baseline: right knee AROM 10-100 deg 06/06/2022: AROM 7-112 deg Goal status: PARTIALLY MET   4.  Patient will report right knee pain with activity </= 3/10 in order to reduce functional limitations Baseline: right knee pain 5/10 with activity 06/06/2022: patient denies any pain and minimal pain with activity Goal status: MET   LONG TERM GOALS: Target date: 07/04/2022    Patient will be I with final HEP to maintain progress from PT. Baseline: HEP provided at eval Goal status: INITIAL   2.  Patient will report >/= 60% status on FOTO to indicate improved functional ability. Baseline: 41% functional status 06/06/2022: 62% Goal status: MET   3.  Patient will demonstrate right knee AROM 0-120 deg in order to normalize gait and transfers without limitation Baseline:  right knee AROM 10-100 deg Goal status: INITIAL   4.  Patient will demonstrate right knee strength grossly = 5/5 MMT in order to improve walking and stair  negotiation Baseline: right knee strength limitation (see above) Goal status: INITIAL   5.  Patient will ambulate community level distances with LRAD in order to improve community access Baseline:patient ambulating household distances with RW Goal status: INITIAL   6.  Patient will report right knee pain with activity </= 1/10 in order to reduce functional limitations Baseline: right knee pain 5/10 with activity Goal status: INITIAL     PLAN: PT FREQUENCY: 1-2x/week   PT DURATION: 8 weeks   PLANNED INTERVENTIONS: Therapeutic exercises, Therapeutic activity, Neuromuscular re-education, Balance training, Gait training, Patient/Family education, Self Care, Joint mobilization, Joint manipulation, Stair training, Aquatic Therapy, Dry Needling, Cryotherapy, Moist heat, Taping, Vasopneumatic device, Manual therapy, and Re-evaluation   PLAN FOR NEXT SESSION:  Reassess STGs and FOTO, review HEP and progress PRN, manual/stretching to improve right knee mobility, strength progression, gait training   Hilda Blades, PT, DPT, LAT, ATC 06/06/22  11:36 AM Phone: [phone] Fax: [phone]

## 2022-06-06 ENCOUNTER — Encounter: Payer: Self-pay | Admitting: Physical Therapy

## 2022-06-06 ENCOUNTER — Ambulatory Visit: Payer: BC Managed Care – PPO | Admitting: Physical Therapy

## 2022-06-06 ENCOUNTER — Other Ambulatory Visit: Payer: Self-pay

## 2022-06-06 DIAGNOSIS — M25561 Pain in right knee: Secondary | ICD-10-CM | POA: Diagnosis not present

## 2022-06-06 DIAGNOSIS — R2689 Other abnormalities of gait and mobility: Secondary | ICD-10-CM

## 2022-06-06 DIAGNOSIS — R600 Localized edema: Secondary | ICD-10-CM

## 2022-06-06 DIAGNOSIS — G8929 Other chronic pain: Secondary | ICD-10-CM

## 2022-06-06 DIAGNOSIS — M6281 Muscle weakness (generalized): Secondary | ICD-10-CM

## 2022-06-06 DIAGNOSIS — M25661 Stiffness of right knee, not elsewhere classified: Secondary | ICD-10-CM

## 2022-06-06 NOTE — Patient Instructions (Signed)
Access Code: [email or web address] URL: [email or web address] Date: 06/06/2022 Prepared by: Hilda Blades  Exercises - Long Sitting Calf Stretch with Strap  - 2-3 x daily - 3 sets - 30 seconds hold - Seated Hamstring Stretch  - 2-3 x daily - 3 sets - 30 seconds hold - Supine Knee Extension Mobilization with Weight  - 2-3 x daily - 5 minutes hold - Seated Knee Flexion Stretch  - 2-3 x daily - 10 reps - 5 seconds hold - Supine Heel Slide with Strap  - 2-3 x daily - 10 reps - 5 seconds hold - Active Straight Leg Raise with Quad Set  - 1 x daily - 3 sets - 10 reps - Sidelying Hip Abduction  - 3 sets - 15 reps - Sit to Stand Without Arm Support  - 3 sets - 10 reps - Standing Gastroc Stretch at Counter  - 2 x daily - 3 sets - 30 seconds hold - Standing Terminal Knee Extension with Resistance  - 2 sets - 20 reps

## 2022-06-07 NOTE — Therapy (Signed)
OUTPATIENT PHYSICAL THERAPY TREATMENT NOTE   Patient Name: Johnny Bradley MRN: [id number] DOB:[birth year], 61 y.o., male Today's Date: 06/08/2022  PCP: [name], MD   REFERRING PROVIDER: Aundra Dubin, PA-C   END OF SESSION:   PT End of Session - 06/08/22 1109     Visit Number 10    Number of Visits 17    Date for PT Re-Evaluation 07/04/22    Authorization Type BCBS / [employer]    PT Start Time 1045    PT Stop Time 1130    PT Time Calculation (min) 45 min    Activity Tolerance Patient tolerated treatment well    Behavior During Therapy WFL for tasks assessed/performed                     Past Medical History:  Diagnosis Date   Arthritis    Back pain    Hyperlipidemia    Hypertension    Shoulder pain    Left   Sleep apnea    Past Surgical History:  Procedure Laterality Date   KNEE ARTHROSCOPY Right    MCL Torn   PILONIDAL CYST EXCISION  1980   TOTAL KNEE ARTHROPLASTY Right 04/17/2022   Procedure: RIGHT TOTAL KNEE ARTHROPLASTY;  Surgeon: Leandrew Koyanagi, MD;  Location: [place];  Service: Orthopedics;  Laterality: Right;   WISDOM TOOTH EXTRACTION     Patient Active Problem List   Diagnosis Date Noted   Status post total left knee replacement 04/17/2022   Primary osteoarthritis of right knee 11/15/2021   Right shoulder pain 11/01/2021   Abdominal discomfort 11/01/2021   Bilateral knee pain 09/15/2020   OSA (obstructive sleep apnea) 02/03/2020   Chronic knee pain 02/03/2020   Family history of angiosarcoma 04/15/2019   Prediabetes 04/14/2019   HLD (hyperlipidemia) 10/02/2018   Subcutaneous nodule of right hand 10/02/2018   Skin lesion 05/29/2018   Hypertension 12/03/2017   Obesity 05/25/2017   Right low back pain 12/19/2014    REFERRING DIAG: Hx of total knee replacement, right  THERAPY DIAG:  Chronic pain of right knee  Stiffness of right knee, not elsewhere classified  Muscle weakness (generalized)  Other abnormalities of gait  and mobility  Localized edema  Rationale for Evaluation and Treatment Rehabilitation  PERTINENT HISTORY: None  PRECAUTIONS: None   SUBJECTIVE: Patient reports he is doing well. He is doing about the same since last visit.   PAIN:  Are you having pain? No  PATIENT GOALS: Progress to walking without device   OBJECTIVE: (objective measures completed at initial evaluation unless otherwise dated) PATIENT SURVEYS:  FOTO 41% functional status  06/06/2022: 62% - met goal   EDEMA:  Circumferential: Not formally assessed. Patient does exhibit right knee and lower leg swelling compared to left   MUSCLE LENGTH: Limitation in calf, hamstring, and quad flexibility   LOWER EXTREMITY ROM:   Active ROM Right eval Right  05/29/22 Right 06/06/2022 Right  Knee flexion 100  108 112  Knee extension 10 lacking 7 lacking Lacking 7 Lacking 7    LOWER EXTREMITY MMT:   MMT Right eval Left eval  Hip flexion 4- -  Hip extension 3 -  Hip abduction 3 -  Knee flexion 4 -  Knee extension 4- -    FUNCTIONAL TESTS:  Not assessed   GAIT: - 05/23/2022 Distance walked: 100 ft Assistive device utilized: SPC - lofstrand Level of assistance: Modified independence Comments: limitation in knee extension with stance, minimal flexion of  knee with swing, flat foot strike, antalgic on right, heavy reliance on walker for support     TODAY'S TREATMENT: [facility] Adult PT Treatment:                                                DATE: 06/08/22 Therapeutic Exercise: NuStep L7 x 5 min with UE/LE while taking subjective Seated hamstring stretch with heel on stool 3 x 30 sec Modified thomas stretch with passive knee flexion 3 x 30 sec Slant board calf stretch 3 x 30 sec Standing TKE with blue x 20 Knee extension machine 30# 3 x 10 Squat with TRX support x 10 Forward 6" step up 2 x 10 each Manual Therapy: Rt knee flexion/extension PROM to tolerance Patellar mobilizations all planes grade III-IV Rt knee  joint mobilizations to improve flexion/extension   [facility] Adult PT Treatment:                                                DATE: 06/06/22 Therapeutic Exercise: Recumbent bike L3 x 4 min while taking subjective Seated hamstring stretch with heel on stool 3 x 30 sec Slant board calf stretch 3 x 30 sec Squat with TRX support x 10 Knee extension machine 25# 3 x 10 Hamstring curl machine 35# 3 x 10 Standing heel raises 2 x 20 Forward 6" step up 2 x 10 Sidelying hip abduction 2 x 15 Sit to stand x 10  [facility] Adult PT Treatment:                                                DATE: 05/31/22 Therapeutic Exercise: Recumbent bike L1 x 5 min, initially reverse then forward revolutions Slant board calf stretch 2 x 60 sec Squat with FM bar support x 10 Seated hamstring stretch with heel on stool  2 x 60 sec  LAQ with 5# 2 x 10 Standing TKE with blue x 20 Standing heel raises 2 x 20 Forward 4" step up 2 x 10 SLR 2 x 10  Sidelying hip abduction 2 x 10 Seated hamstring curl with green 2 x 10  PATIENT EDUCATION:  Education details: HEP Person educated: Patient Education method: Consulting civil engineer, Demonstration, Tactile cues, Verbal cues Education comprehension: verbalized understanding, returned demonstration, verbal cues required, tactile cues required   HOME EXERCISE PROGRAM: Access Code: [email or web address]     ASSESSMENT: CLINICAL IMPRESSION: Patient tolerated therapy well with no adverse effects. Therapy focused on continued improvement of knee mobility and progressing strength with good tolerance. Incorporated manual this visit to improve mobility and he does continue to exhibit limitation in knee motion. No changes to HEP this visit. Patient would benefit from continued skilled PT to progress his mobility and strength to reduce pain and maximize functional ability.     OBJECTIVE IMPAIRMENTS Abnormal gait, decreased activity tolerance, decreased balance, difficulty walking, decreased ROM, decreased  strength, increased edema, impaired flexibility, and pain.    ACTIVITY LIMITATIONS lifting, bending, sitting, standing, squatting, stairs, transfers, toileting, dressing, and locomotion level   PARTICIPATION LIMITATIONS: meal prep, cleaning, driving, shopping, community activity, and yard work  PERSONAL FACTORS Fitness, Past/current experiences, and Time since onset of injury/illness/exacerbation are also affecting patient's functional outcome.      GOALS: Goals reviewed with patient? Yes   SHORT TERM GOALS: Target date: 06/06/2022    Patient will be I with initial HEP in order to progress with therapy. Baseline: HEP provided at eval 06/06/2022: Goal status: MET   2.  PT will review FOTO with patient by 3rd visit in order to understand expected progress and outcome with therapy. Baseline: FOTO assessed at eval 06/06/2022: reviewed and reassessed Goal status: MET   3.  Patient will demonstrate right knee AROM 3-115 deg in order to improve gait and functional mobility. Baseline: right knee AROM 10-100 deg 06/06/2022: AROM 7-112 deg Goal status: PARTIALLY MET   4.  Patient will report right knee pain with activity </= 3/10 in order to reduce functional limitations Baseline: right knee pain 5/10 with activity 06/06/2022: patient denies any pain and minimal pain with activity Goal status: MET   LONG TERM GOALS: Target date: 07/04/2022    Patient will be I with final HEP to maintain progress from PT. Baseline: HEP provided at eval Goal status: INITIAL   2.  Patient will report >/= 60% status on FOTO to indicate improved functional ability. Baseline: 41% functional status 06/06/2022: 62% Goal status: MET   3.  Patient will demonstrate right knee AROM 0-120 deg in order to normalize gait and transfers without limitation Baseline:  right knee AROM 10-100 deg Goal status: INITIAL   4.  Patient will demonstrate right knee strength grossly = 5/5 MMT in order to improve walking  and stair negotiation Baseline: right knee strength limitation (see above) Goal status: INITIAL   5.  Patient will ambulate community level distances with LRAD in order to improve community access Baseline:patient ambulating household distances with RW Goal status: INITIAL   6.  Patient will report right knee pain with activity </= 1/10 in order to reduce functional limitations Baseline: right knee pain 5/10 with activity Goal status: INITIAL     PLAN: PT FREQUENCY: 1-2x/week   PT DURATION: 8 weeks   PLANNED INTERVENTIONS: Therapeutic exercises, Therapeutic activity, Neuromuscular re-education, Balance training, Gait training, Patient/Family education, Self Care, Joint mobilization, Joint manipulation, Stair training, Aquatic Therapy, Dry Needling, Cryotherapy, Moist heat, Taping, Vasopneumatic device, Manual therapy, and Re-evaluation   PLAN FOR NEXT SESSION:  Reassess STGs and FOTO, review HEP and progress PRN, manual/stretching to improve right knee mobility, strength progression, gait training   Hilda Blades, PT, DPT, LAT, ATC 06/08/22  11:48 AM Phone: [phone] Fax: [phone]

## 2022-06-08 ENCOUNTER — Encounter: Payer: Self-pay | Admitting: Physical Therapy

## 2022-06-08 ENCOUNTER — Ambulatory Visit: Payer: BC Managed Care – PPO | Admitting: Physical Therapy

## 2022-06-08 ENCOUNTER — Other Ambulatory Visit: Payer: Self-pay

## 2022-06-08 DIAGNOSIS — M6281 Muscle weakness (generalized): Secondary | ICD-10-CM

## 2022-06-08 DIAGNOSIS — R600 Localized edema: Secondary | ICD-10-CM

## 2022-06-08 DIAGNOSIS — M25661 Stiffness of right knee, not elsewhere classified: Secondary | ICD-10-CM

## 2022-06-08 DIAGNOSIS — M25561 Pain in right knee: Secondary | ICD-10-CM | POA: Diagnosis not present

## 2022-06-08 DIAGNOSIS — R2689 Other abnormalities of gait and mobility: Secondary | ICD-10-CM

## 2022-06-08 DIAGNOSIS — G8929 Other chronic pain: Secondary | ICD-10-CM

## 2022-06-12 NOTE — Therapy (Signed)
OUTPATIENT PHYSICAL THERAPY TREATMENT NOTE   Patient Name: Johnny Bradley MRN: [id number] DOB:Dec 26, 1960, 61 y.o., male Today's Date: 06/13/2022  PCP: [name], MD   REFERRING PROVIDER: Aundra Dubin, PA-C   END OF SESSION:   PT End of Session - 06/13/22 1016     Visit Number 11    Number of Visits 17    Date for PT Re-Evaluation 07/04/22    Authorization Type [employer] / [employer]    PT Start Time 1016    PT Stop Time 1057    PT Time Calculation (min) 41 min    Activity Tolerance Patient tolerated treatment well    Behavior During Therapy WFL for tasks assessed/performed                      Past Medical History:  Diagnosis Date   Arthritis    Back pain    Hyperlipidemia    Hypertension    Shoulder pain    Left   Sleep apnea    Past Surgical History:  Procedure Laterality Date   KNEE ARTHROSCOPY Right    MCL Torn   PILONIDAL CYST EXCISION  1980   TOTAL KNEE ARTHROPLASTY Right 04/17/2022   Procedure: RIGHT TOTAL KNEE ARTHROPLASTY;  Surgeon: Leandrew Koyanagi, MD;  Location: [place];  Service: Orthopedics;  Laterality: Right;   WISDOM TOOTH EXTRACTION     Patient Active Problem List   Diagnosis Date Noted   Status post total left knee replacement 04/17/2022   Primary osteoarthritis of right knee 11/15/2021   Right shoulder pain 11/01/2021   Abdominal discomfort 11/01/2021   Bilateral knee pain 09/15/2020   OSA (obstructive sleep apnea) 02/03/2020   Chronic knee pain 02/03/2020   Family history of angiosarcoma 04/15/2019   Prediabetes 04/14/2019   HLD (hyperlipidemia) 10/02/2018   Subcutaneous nodule of right hand 10/02/2018   Skin lesion 05/29/2018   Hypertension 12/03/2017   Obesity 05/25/2017   Right low back pain 12/19/2014    REFERRING DIAG: Hx of total knee replacement, right  THERAPY DIAG:  Chronic pain of right knee  Stiffness of right knee, not elsewhere classified  Muscle weakness (generalized)  Other abnormalities of gait  and mobility  Localized edema  Rationale for Evaluation and Treatment Rehabilitation  PERTINENT HISTORY: None  PRECAUTIONS: None   SUBJECTIVE: Patient reports no pain currently, but had some pain this weekend.   PAIN:  Are you having pain? No  PATIENT GOALS: Progress to walking without device   OBJECTIVE: (objective measures completed at initial evaluation unless otherwise dated) PATIENT SURVEYS:  FOTO 41% functional status  06/06/2022: 62% - met goal   EDEMA:  Circumferential: Not formally assessed. Patient does exhibit right knee and lower leg swelling compared to left   MUSCLE LENGTH: Limitation in calf, hamstring, and quad flexibility   LOWER EXTREMITY ROM:   Active ROM Right eval Right  05/29/22 Right 06/06/2022 Right  Knee flexion 100  108 112  Knee extension 10 lacking 7 lacking Lacking 7 Lacking 7    LOWER EXTREMITY MMT:   MMT Right eval Left eval  Hip flexion 4- -  Hip extension 3 -  Hip abduction 3 -  Knee flexion 4 -  Knee extension 4- -    FUNCTIONAL TESTS:  Not assessed   GAIT: - 05/23/2022 Distance walked: 100 ft Assistive device utilized: SPC - lofstrand Level of assistance: Modified independence Comments: limitation in knee extension with stance, minimal flexion of knee with swing,  flat foot strike, antalgic on right, heavy reliance on walker for support     TODAY'S TREATMENT: [facility] Adult PT Treatment:                                                DATE: 06/13/22 Therapeutic Exercise: NuStep level 7 x 5 minutes UE/LE Calf stretch on slant board x 60 sec Prone quad stretch 2 x 30 sec  Lateral step downs 4 inch step 1 x 10; LLE; unable to complete lateral step down on 2 and 4 inch step with the RLE.  Eccentric leg press 2 x 10; RLE @ 40 lbs  SL calf raise 2 x 15 each  Updated HEP  Neuromuscular re-ed: Tandem x 30 sec each Tandem on airex 2 x 30 sec each SLS RLE multiple trials 2-3 seconds  Romberg eyes closed on foam 2 x 30 sec      [facility] Adult PT Treatment:                                                DATE: 06/08/22 Therapeutic Exercise: NuStep L7 x 5 min with UE/LE while taking subjective Seated hamstring stretch with heel on stool 3 x 30 sec Modified thomas stretch with passive knee flexion 3 x 30 sec Slant board calf stretch 3 x 30 sec Standing TKE with blue x 20 Knee extension machine 30# 3 x 10 Squat with TRX support x 10 Forward 6" step up 2 x 10 each Manual Therapy: Rt knee flexion/extension PROM to tolerance Patellar mobilizations all planes grade III-IV Rt knee joint mobilizations to improve flexion/extension   [facility] Adult PT Treatment:                                                DATE: 06/06/22 Therapeutic Exercise: Recumbent bike L3 x 4 min while taking subjective Seated hamstring stretch with heel on stool 3 x 30 sec Slant board calf stretch 3 x 30 sec Squat with TRX support x 10 Knee extension machine 25# 3 x 10 Hamstring curl machine 35# 3 x 10 Standing heel raises 2 x 20 Forward 6" step up 2 x 10 Sidelying hip abduction 2 x 15 Sit to stand x 10   PATIENT EDUCATION:  Education details: HEP Person educated: Patient Education method: Consulting civil engineer, Demonstration, Corporate treasurer cues, Verbal cues Education comprehension: verbalized understanding, returned demonstration, verbal cues required, tactile cues required   HOME EXERCISE PROGRAM: Access Code: [email or web address]     ASSESSMENT: CLINICAL IMPRESSION: Patient tolerated therapy well with no adverse effects. Introduced eccentric quad strengthening with patient unable to complete step down from 2 or 4 inch step secondary to weakness. He is able to perform eccentric leg press with minimal load on the RLE with good control and no reports of pain. Introduced Occupational psychologist with patient able to maintain SLS for a few seconds before LOB requiring reaching strategy to maintain balance. HEP was updated to include further strengthening and balance  activity.      OBJECTIVE IMPAIRMENTS Abnormal gait, decreased activity tolerance, decreased balance, difficulty walking, decreased ROM, decreased  strength, increased edema, impaired flexibility, and pain.    ACTIVITY LIMITATIONS lifting, bending, sitting, standing, squatting, stairs, transfers, toileting, dressing, and locomotion level   PARTICIPATION LIMITATIONS: meal prep, cleaning, driving, shopping, community activity, and yard work   [place], Past/current experiences, and Time since onset of injury/illness/exacerbation are also affecting patient's functional outcome.      GOALS: Goals reviewed with patient? Yes   SHORT TERM GOALS: Target date: 06/06/2022    Patient will be I with initial HEP in order to progress with therapy. Baseline: HEP provided at eval 06/06/2022: Goal status: MET   2.  PT will review FOTO with patient by 3rd visit in order to understand expected progress and outcome with therapy. Baseline: FOTO assessed at eval 06/06/2022: reviewed and reassessed Goal status: MET   3.  Patient will demonstrate right knee AROM 3-115 deg in order to improve gait and functional mobility. Baseline: right knee AROM 10-100 deg 06/06/2022: AROM 7-112 deg Goal status: PARTIALLY MET   4.  Patient will report right knee pain with activity </= 3/10 in order to reduce functional limitations Baseline: right knee pain 5/10 with activity 06/06/2022: patient denies any pain and minimal pain with activity Goal status: MET   LONG TERM GOALS: Target date: 07/04/2022    Patient will be I with final HEP to maintain progress from PT. Baseline: HEP provided at eval Goal status: INITIAL   2.  Patient will report >/= 60% status on FOTO to indicate improved functional ability. Baseline: 41% functional status 06/06/2022: 62% Goal status: MET   3.  Patient will demonstrate right knee AROM 0-120 deg in order to normalize gait and transfers without limitation Baseline:   right knee AROM 10-100 deg Goal status: INITIAL   4.  Patient will demonstrate right knee strength grossly = 5/5 MMT in order to improve walking and stair negotiation Baseline: right knee strength limitation (see above) Goal status: INITIAL   5.  Patient will ambulate community level distances with LRAD in order to improve community access Baseline:patient ambulating household distances with RW Goal status: INITIAL   6.  Patient will report right knee pain with activity </= 1/10 in order to reduce functional limitations Baseline: right knee pain 5/10 with activity Goal status: INITIAL     PLAN: PT FREQUENCY: 1-2x/week   PT DURATION: 8 weeks   PLANNED INTERVENTIONS: Therapeutic exercises, Therapeutic activity, Neuromuscular re-education, Balance training, Gait training, Patient/Family education, Self Care, Joint mobilization, Joint manipulation, Stair training, Aquatic Therapy, Dry Needling, Cryotherapy, Moist heat, Taping, Vasopneumatic device, Manual therapy, and Re-evaluation   PLAN FOR NEXT SESSION:  review HEP and progress PRN, manual/stretching to improve right knee mobility, strength progression, gait training  Gwendolyn Grant, PT, DPT, ATC 06/13/22 10:59 AM

## 2022-06-13 ENCOUNTER — Ambulatory Visit: Payer: BC Managed Care – PPO

## 2022-06-13 DIAGNOSIS — M25561 Pain in right knee: Secondary | ICD-10-CM | POA: Diagnosis not present

## 2022-06-13 DIAGNOSIS — R600 Localized edema: Secondary | ICD-10-CM

## 2022-06-13 DIAGNOSIS — G8929 Other chronic pain: Secondary | ICD-10-CM

## 2022-06-13 DIAGNOSIS — R2689 Other abnormalities of gait and mobility: Secondary | ICD-10-CM

## 2022-06-13 DIAGNOSIS — M6281 Muscle weakness (generalized): Secondary | ICD-10-CM

## 2022-06-13 DIAGNOSIS — M25661 Stiffness of right knee, not elsewhere classified: Secondary | ICD-10-CM

## 2022-06-15 NOTE — Therapy (Signed)
OUTPATIENT PHYSICAL THERAPY TREATMENT NOTE   Patient Name: Johnny Bradley MRN: [id number] DOB:1961/05/08, 61 y.o., male Today's Date: 06/16/2022  PCP: [name], MD   REFERRING PROVIDER: Aundra Dubin, PA-C   END OF SESSION:   PT End of Session - 06/16/22 1018     Visit Number 12    Number of Visits 17    Date for PT Re-Evaluation 07/04/22    Authorization Type BCBS / [employer]    PT Start Time 1018    PT Stop Time 1100    PT Time Calculation (min) 42 min    Activity Tolerance Patient tolerated treatment well    Behavior During Therapy WFL for tasks assessed/performed                       Past Medical History:  Diagnosis Date   Arthritis    Back pain    Hyperlipidemia    Hypertension    Shoulder pain    Left   Sleep apnea    Past Surgical History:  Procedure Laterality Date   KNEE ARTHROSCOPY Right    MCL Torn   PILONIDAL CYST EXCISION  1980   TOTAL KNEE ARTHROPLASTY Right 04/17/2022   Procedure: RIGHT TOTAL KNEE ARTHROPLASTY;  Surgeon: Leandrew Koyanagi, MD;  Location: [place];  Service: Orthopedics;  Laterality: Right;   WISDOM TOOTH EXTRACTION     Patient Active Problem List   Diagnosis Date Noted   Status post total left knee replacement 04/17/2022   Primary osteoarthritis of right knee 11/15/2021   Right shoulder pain 11/01/2021   Abdominal discomfort 11/01/2021   Bilateral knee pain 09/15/2020   OSA (obstructive sleep apnea) 02/03/2020   Chronic knee pain 02/03/2020   Family history of angiosarcoma 04/15/2019   Prediabetes 04/14/2019   HLD (hyperlipidemia) 10/02/2018   Subcutaneous nodule of right hand 10/02/2018   Skin lesion 05/29/2018   Hypertension 12/03/2017   Obesity 05/25/2017   Right low back pain 12/19/2014    REFERRING DIAG: Hx of total knee replacement, right  THERAPY DIAG:  Chronic pain of right knee  Stiffness of right knee, not elsewhere classified  Muscle weakness (generalized)  Other abnormalities of  gait and mobility  Localized edema  Rationale for Evaluation and Treatment Rehabilitation  PERTINENT HISTORY: None  PRECAUTIONS: None   SUBJECTIVE: Patient reports his knee is feeling good right now without reports of pain. He is still taking tylenol a couple times a day for pain. He has been using an extension device to help with his ROM, but doesn't feel his motion has changed very much.   PAIN:  Are you having pain? No  PATIENT GOALS: Progress to walking without device   OBJECTIVE: (objective measures completed at initial evaluation unless otherwise dated) PATIENT SURVEYS:  FOTO 41% functional status  06/06/2022: 62% - met goal   EDEMA:  Circumferential: Not formally assessed. Patient does exhibit right knee and lower leg swelling compared to left   MUSCLE LENGTH: Limitation in calf, hamstring, and quad flexibility   LOWER EXTREMITY ROM:   Active ROM Right eval Right  05/29/22 Right 06/06/2022 Right 06/16/22 Right  Knee flexion 100  108 112 112  Knee extension 10 lacking 7 lacking Lacking 7 Lacking 7 Lacking 6    LOWER EXTREMITY MMT:   MMT Right eval Left eval  Hip flexion 4- -  Hip extension 3 -  Hip abduction 3 -  Knee flexion 4 -  Knee extension 4- -  FUNCTIONAL TESTS:  Not assessed   GAIT: - 05/23/2022 Distance walked: 100 ft Assistive device utilized: SPC - lofstrand Level of assistance: Modified independence Comments: limitation in knee extension with stance, minimal flexion of knee with swing, flat foot strike, antalgic on right, heavy reliance on walker for support     TODAY'S TREATMENT: [facility] Adult PT Treatment:                                                DATE: 06/16/22 Therapeutic Exercise: Recumbent bike level 3 x 5 minutes  Eccentric squat to table attempted; unable to control SL on the RLE due to weakness.  SL knee extension RLE 3 x 10 @ 5 lbs  SL HS curl RLE 3 x 10 @ 20 lbs  SL leg press RLE 3 x 10 @ 20 lbs  Sit to stand 1 x 10   Updated HEP  Manual Therapy: Rt knee PROM to tolerance into flexion/extension Patellar mobilizations all planes grade III    [facility] Adult PT Treatment:                                                DATE: 06/13/22 Therapeutic Exercise: NuStep level 7 x 5 minutes UE/LE Calf stretch on slant board x 60 sec Prone quad stretch 2 x 30 sec  Lateral step downs 4 inch step 1 x 10; LLE; unable to complete lateral step down on 2 and 4 inch step with the RLE.  Eccentric leg press 2 x 10; RLE @ 40 lbs  SL calf raise 2 x 15 each  Updated HEP  Neuromuscular re-ed: Tandem x 30 sec each Tandem on airex 2 x 30 sec each SLS RLE multiple trials 2-3 seconds  Romberg eyes closed on foam 2 x 30 sec     [facility] Adult PT Treatment:                                                DATE: 06/08/22 Therapeutic Exercise: NuStep L7 x 5 min with UE/LE while taking subjective Seated hamstring stretch with heel on stool 3 x 30 sec Modified thomas stretch with passive knee flexion 3 x 30 sec Slant board calf stretch 3 x 30 sec Standing TKE with blue x 20 Knee extension machine 30# 3 x 10 Squat with TRX support x 10 Forward 6" step up 2 x 10 each Manual Therapy: Rt knee flexion/extension PROM to tolerance Patellar mobilizations all planes grade III-IV Rt knee joint mobilizations to improve flexion/extension   PATIENT EDUCATION:  Education details: HEP Person educated: Patient Education method: Consulting civil engineer, Demonstration, Corporate treasurer cues, Verbal cues Education comprehension: verbalized understanding, returned demonstration, verbal cues required, tactile cues required   HOME EXERCISE PROGRAM: Access Code: [email or web address]     ASSESSMENT: CLINICAL IMPRESSION: Patient tolerated therapy well with no adverse effects. Focused on single leg strengthening of the Rt knee as patient was unable to maintain SLS or eccentrically lower on the RLE with buckling noted when attempting SL eccentric mini squat to table at beginning  of session. With sit to stand he has difficulty  allowing for equal weight-bearing during ascent and descent requiring consistent cues to decrease Lt hip shift and maintain foot alignment. HEP was updated to include further Rt knee strengthening.      OBJECTIVE IMPAIRMENTS Abnormal gait, decreased activity tolerance, decreased balance, difficulty walking, decreased ROM, decreased strength, increased edema, impaired flexibility, and pain.    ACTIVITY LIMITATIONS lifting, bending, sitting, standing, squatting, stairs, transfers, toileting, dressing, and locomotion level   PARTICIPATION LIMITATIONS: meal prep, cleaning, driving, shopping, community activity, and yard work   [employer] [employer] [place], Past/current experiences, and Time since onset of injury/illness/exacerbation are also affecting patient's functional outcome.      GOALS: Goals reviewed with patient? Yes   SHORT TERM GOALS: Target date: 06/06/2022    Patient will be I with initial HEP in order to progress with therapy. Baseline: HEP provided at eval 06/06/2022: Goal status: MET   2.  PT will review FOTO with patient by 3rd visit in order to understand expected progress and outcome with therapy. Baseline: FOTO assessed at eval 06/06/2022: reviewed and reassessed Goal status: MET   3.  Patient will demonstrate right knee AROM 3-115 deg in order to improve gait and functional mobility. Baseline: right knee AROM 10-100 deg 06/06/2022: AROM 7-112 deg Goal status: PARTIALLY MET   4.  Patient will report right knee pain with activity </= 3/10 in order to reduce functional limitations Baseline: right knee pain 5/10 with activity 06/06/2022: patient denies any pain and minimal pain with activity Goal status: MET   LONG TERM GOALS: Target date: 07/04/2022    Patient will be I with final HEP to maintain progress from PT. Baseline: HEP provided at eval Goal status: INITIAL   2.  Patient will report >/= 60% status on FOTO to  indicate improved functional ability. Baseline: 41% functional status 06/06/2022: 62% Goal status: MET   3.  Patient will demonstrate right knee AROM 0-120 deg in order to normalize gait and transfers without limitation Baseline:  right knee AROM 10-100 deg Goal status: INITIAL   4.  Patient will demonstrate right knee strength grossly = 5/5 MMT in order to improve walking and stair negotiation Baseline: right knee strength limitation (see above) Goal status: INITIAL   5.  Patient will ambulate community level distances with LRAD in order to improve community access Baseline:patient ambulating household distances with RW Goal status: INITIAL   6.  Patient will report right knee pain with activity </= 1/10 in order to reduce functional limitations Baseline: right knee pain 5/10 with activity Goal status: INITIAL     PLAN: PT FREQUENCY: 1-2x/week   PT DURATION: 8 weeks   PLANNED INTERVENTIONS: Therapeutic exercises, Therapeutic activity, Neuromuscular re-education, Balance training, Gait training, Patient/Family education, Self Care, Joint mobilization, Joint manipulation, Stair training, Aquatic Therapy, Dry Needling, Cryotherapy, Moist heat, Taping, Vasopneumatic device, Manual therapy, and Re-evaluation   PLAN FOR NEXT SESSION:  review HEP and progress PRN, manual/stretching to improve right knee mobility, strength progression, gait training  Gwendolyn Grant, PT, DPT, ATC 06/16/22 11:44 AM

## 2022-06-16 ENCOUNTER — Ambulatory Visit: Payer: BC Managed Care – PPO

## 2022-06-16 DIAGNOSIS — R2689 Other abnormalities of gait and mobility: Secondary | ICD-10-CM

## 2022-06-16 DIAGNOSIS — M25661 Stiffness of right knee, not elsewhere classified: Secondary | ICD-10-CM

## 2022-06-16 DIAGNOSIS — G8929 Other chronic pain: Secondary | ICD-10-CM

## 2022-06-16 DIAGNOSIS — M25561 Pain in right knee: Secondary | ICD-10-CM | POA: Diagnosis not present

## 2022-06-16 DIAGNOSIS — M6281 Muscle weakness (generalized): Secondary | ICD-10-CM

## 2022-06-16 DIAGNOSIS — R600 Localized edema: Secondary | ICD-10-CM

## 2022-06-19 NOTE — Therapy (Signed)
OUTPATIENT PHYSICAL THERAPY TREATMENT NOTE   Patient Name: Johnny Bradley MRN: [id number] DOB:25-Oct-1960, 61 y.o., male Today's Date: 06/20/2022  PCP: [name], MD   REFERRING PROVIDER: Aundra Dubin, PA-C   END OF SESSION:   PT End of Session - 06/20/22 1013     Visit Number 13    Number of Visits 17    Date for PT Re-Evaluation 07/04/22    Authorization Type BCBS / [employer]    PT Start Time 1015    PT Stop Time 1100    PT Time Calculation (min) 45 min    Activity Tolerance Patient tolerated treatment well    Behavior During Therapy WFL for tasks assessed/performed                        Past Medical History:  Diagnosis Date   Arthritis    Back pain    Hyperlipidemia    Hypertension    Shoulder pain    Left   Sleep apnea    Past Surgical History:  Procedure Laterality Date   KNEE ARTHROSCOPY Right    MCL Torn   PILONIDAL CYST EXCISION  1980   TOTAL KNEE ARTHROPLASTY Right 04/17/2022   Procedure: RIGHT TOTAL KNEE ARTHROPLASTY;  Surgeon: Leandrew Koyanagi, MD;  Location: [place];  Service: Orthopedics;  Laterality: Right;   WISDOM TOOTH EXTRACTION     Patient Active Problem List   Diagnosis Date Noted   Status post total left knee replacement 04/17/2022   Primary osteoarthritis of right knee 11/15/2021   Right shoulder pain 11/01/2021   Abdominal discomfort 11/01/2021   Bilateral knee pain 09/15/2020   OSA (obstructive sleep apnea) 02/03/2020   Chronic knee pain 02/03/2020   Family history of angiosarcoma 04/15/2019   Prediabetes 04/14/2019   HLD (hyperlipidemia) 10/02/2018   Subcutaneous nodule of right hand 10/02/2018   Skin lesion 05/29/2018   Hypertension 12/03/2017   Obesity 05/25/2017   Right low back pain 12/19/2014    REFERRING DIAG: Hx of total knee replacement, right  THERAPY DIAG:  Chronic pain of right knee  Stiffness of right knee, not elsewhere classified  Muscle weakness (generalized)  Other abnormalities of  gait and mobility  Localized edema  Rationale for Evaluation and Treatment Rehabilitation  PERTINENT HISTORY: None  PRECAUTIONS: None   SUBJECTIVE: Patient reports no pain in the knee currently. He brought his extension device in today to go over.   PAIN:  Are you having pain? No  PATIENT GOALS: Progress to walking without device   OBJECTIVE: (objective measures completed at initial evaluation unless otherwise dated) PATIENT SURVEYS:  FOTO 41% functional status  06/06/2022: 62% - met goal   EDEMA:  Circumferential: Not formally assessed. Patient does exhibit right knee and lower leg swelling compared to left   MUSCLE LENGTH: Limitation in calf, hamstring, and quad flexibility   LOWER EXTREMITY ROM:   Active ROM Right eval Right  05/29/22 Right 06/06/2022 Right 06/16/22 Right  Knee flexion 100  108 112 112  Knee extension 10 lacking 7 lacking Lacking 7 Lacking 7 Lacking 6    LOWER EXTREMITY MMT:   MMT Right eval Left eval  Hip flexion 4- -  Hip extension 3 -  Hip abduction 3 -  Knee flexion 4 -  Knee extension 4- -    FUNCTIONAL TESTS:  Not assessed   GAIT: - 05/23/2022 Distance walked: 100 ft Assistive device utilized: SPC - lofstrand Level of assistance: Modified  independence Comments: limitation in knee extension with stance, minimal flexion of knee with swing, flat foot strike, antalgic on right, heavy reliance on walker for support     TODAY'S TREATMENT: [facility] Adult PT Treatment:                                                DATE: 06/20/22 Therapeutic Exercise: Recumbent bike level 3 x 5 minutes Prone quad stretch 2 x 30 sec RLE Resisted hip flexion 3 x 10 @ 50 lbs  Leg press eccentric SL 3 x 10 @ 40 lbs  SL eccentric squat to chair with airex pad and TRX for UE support 3 x 5 HS curl on stability ball 2 x 10   Self Care: Discussed used of KneeMD extension device with recommendation on duration/reps of stretching   [facility] Adult PT Treatment:                                                 DATE: 06/16/22 Therapeutic Exercise: Recumbent bike level 3 x 5 minutes  Eccentric squat to table attempted; unable to control SL on the RLE due to weakness.  SL knee extension RLE 3 x 10 @ 5 lbs  SL HS curl RLE 3 x 10 @ 20 lbs  SL leg press RLE 3 x 10 @ 20 lbs  Sit to stand 1 x 10  Updated HEP  Manual Therapy: Rt knee PROM to tolerance into flexion/extension Patellar mobilizations all planes grade III    [facility] Adult PT Treatment:                                                DATE: 06/13/22 Therapeutic Exercise: NuStep level 7 x 5 minutes UE/LE Calf stretch on slant board x 60 sec Prone quad stretch 2 x 30 sec  Lateral step downs 4 inch step 1 x 10; LLE; unable to complete lateral step down on 2 and 4 inch step with the RLE.  Eccentric leg press 2 x 10; RLE @ 40 lbs  SL calf raise 2 x 15 each  Updated HEP  Neuromuscular re-ed: Tandem x 30 sec each Tandem on airex 2 x 30 sec each SLS RLE multiple trials 2-3 seconds  Romberg eyes closed on foam 2 x 30 sec      PATIENT EDUCATION:  Education details: See above Person educated: Patient Education method: Explanation Education comprehension: verbalized understanding   HOME EXERCISE PROGRAM: Access Code: [email or web address]     ASSESSMENT: CLINICAL IMPRESSION: Patient tolerated therapy well with no adverse effects. Patient brought his knee extension device today, so discussed recommendation for duration of hold times and reps to complete at home to assist in restoring his extension ROM. Focused on Rt hip and knee strengthening today with ability to further progress eccentric quad strengthening. Able to increase resistance with eccentric quad strengthening on the leg press with patient demonstrating good control. With eccentric squats on the RLE in standing he requires tactile cues to maintain pelvic alignment and use of UE support to assist in controlling the descent to raised chair. He  reported minimal knee pain with closed chain strengthening, otherwise good tolerance to strengthening.      OBJECTIVE IMPAIRMENTS Abnormal gait, decreased activity tolerance, decreased balance, difficulty walking, decreased ROM, decreased strength, increased edema, impaired flexibility, and pain.    ACTIVITY LIMITATIONS lifting, bending, sitting, standing, squatting, stairs, transfers, toileting, dressing, and locomotion level   PARTICIPATION LIMITATIONS: meal prep, cleaning, driving, shopping, community activity, and yard work   [place], Past/current experiences, and Time since onset of injury/illness/exacerbation are also affecting patient's functional outcome.      GOALS: Goals reviewed with patient? Yes   SHORT TERM GOALS: Target date: 06/06/2022    Patient will be I with initial HEP in order to progress with therapy. Baseline: HEP provided at eval 06/06/2022: Goal status: MET   2.  PT will review FOTO with patient by 3rd visit in order to understand expected progress and outcome with therapy. Baseline: FOTO assessed at eval 06/06/2022: reviewed and reassessed Goal status: MET   3.  Patient will demonstrate right knee AROM 3-115 deg in order to improve gait and functional mobility. Baseline: right knee AROM 10-100 deg 06/06/2022: AROM 7-112 deg Goal status: PARTIALLY MET   4.  Patient will report right knee pain with activity </= 3/10 in order to reduce functional limitations Baseline: right knee pain 5/10 with activity 06/06/2022: patient denies any pain and minimal pain with activity Goal status: MET   LONG TERM GOALS: Target date: 07/04/2022    Patient will be I with final HEP to maintain progress from PT. Baseline: HEP provided at eval Goal status: INITIAL   2.  Patient will report >/= 60% status on FOTO to indicate improved functional ability. Baseline: 41% functional status 06/06/2022: 62% Goal status: MET   3.  Patient will demonstrate right  knee AROM 0-120 deg in order to normalize gait and transfers without limitation Baseline:  right knee AROM 10-100 deg Goal status: INITIAL   4.  Patient will demonstrate right knee strength grossly = 5/5 MMT in order to improve walking and stair negotiation Baseline: right knee strength limitation (see above) Goal status: INITIAL   5.  Patient will ambulate community level distances with LRAD in order to improve community access Baseline:patient ambulating household distances with RW Goal status: INITIAL   6.  Patient will report right knee pain with activity </= 1/10 in order to reduce functional limitations Baseline: right knee pain 5/10 with activity Goal status: INITIAL     PLAN: PT FREQUENCY: 1-2x/week   PT DURATION: 8 weeks   PLANNED INTERVENTIONS: Therapeutic exercises, Therapeutic activity, Neuromuscular re-education, Balance training, Gait training, Patient/Family education, Self Care, Joint mobilization, Joint manipulation, Stair training, Aquatic Therapy, Dry Needling, Cryotherapy, Moist heat, Taping, Vasopneumatic device, Manual therapy, and Re-evaluation   PLAN FOR NEXT SESSION:  review HEP and progress PRN, manual/stretching to improve right knee mobility, strength progression, gait training  Gwendolyn Grant, PT, DPT, ATC 06/20/22 12:16 PM

## 2022-06-20 ENCOUNTER — Ambulatory Visit: Payer: BC Managed Care – PPO

## 2022-06-20 DIAGNOSIS — R2689 Other abnormalities of gait and mobility: Secondary | ICD-10-CM

## 2022-06-20 DIAGNOSIS — M25661 Stiffness of right knee, not elsewhere classified: Secondary | ICD-10-CM

## 2022-06-20 DIAGNOSIS — G8929 Other chronic pain: Secondary | ICD-10-CM

## 2022-06-20 DIAGNOSIS — M6281 Muscle weakness (generalized): Secondary | ICD-10-CM

## 2022-06-20 DIAGNOSIS — R600 Localized edema: Secondary | ICD-10-CM

## 2022-06-20 DIAGNOSIS — M25561 Pain in right knee: Secondary | ICD-10-CM | POA: Diagnosis not present

## 2022-06-22 NOTE — Therapy (Signed)
OUTPATIENT PHYSICAL THERAPY TREATMENT NOTE   Patient Name: Johnny Bradley MRN: [id number] DOB:1961/08/02, 61 y.o., male Today's Date: 06/23/2022  PCP: [name], MD   REFERRING PROVIDER: Aundra Dubin, PA-C   END OF SESSION:   PT End of Session - 06/23/22 1103     Visit Number 14    Number of Visits 17    Date for PT Re-Evaluation 07/04/22    Authorization Type BCBS / [employer]    PT Start Time 1100    PT Stop Time 1140    PT Time Calculation (min) 40 min    Activity Tolerance Patient tolerated treatment well    Behavior During Therapy WFL for tasks assessed/performed                         Past Medical History:  Diagnosis Date   Arthritis    Back pain    Hyperlipidemia    Hypertension    Shoulder pain    Left   Sleep apnea    Past Surgical History:  Procedure Laterality Date   KNEE ARTHROSCOPY Right    MCL Torn   PILONIDAL CYST EXCISION  1980   TOTAL KNEE ARTHROPLASTY Right 04/17/2022   Procedure: RIGHT TOTAL KNEE ARTHROPLASTY;  Surgeon: Leandrew Koyanagi, MD;  Location: [place];  Service: Orthopedics;  Laterality: Right;   WISDOM TOOTH EXTRACTION     Patient Active Problem List   Diagnosis Date Noted   Status post total left knee replacement 04/17/2022   Primary osteoarthritis of right knee 11/15/2021   Right shoulder pain 11/01/2021   Abdominal discomfort 11/01/2021   Bilateral knee pain 09/15/2020   OSA (obstructive sleep apnea) 02/03/2020   Chronic knee pain 02/03/2020   Family history of angiosarcoma 04/15/2019   Prediabetes 04/14/2019   HLD (hyperlipidemia) 10/02/2018   Subcutaneous nodule of right hand 10/02/2018   Skin lesion 05/29/2018   Hypertension 12/03/2017   Obesity 05/25/2017   Right low back pain 12/19/2014    REFERRING DIAG: Hx of total knee replacement, right  THERAPY DIAG:  Chronic pain of right knee  Stiffness of right knee, not elsewhere classified  Muscle weakness (generalized)  Other abnormalities of  gait and mobility  Localized edema  Rationale for Evaluation and Treatment Rehabilitation  PERTINENT HISTORY: None  PRECAUTIONS: None   SUBJECTIVE: Patient reports he continues to do well and feels he is still improving.  PAIN:  Are you having pain? No  PATIENT GOALS: Progress to walking without device   OBJECTIVE: (objective measures completed at initial evaluation unless otherwise dated) PATIENT SURVEYS:  FOTO 41% functional status  06/06/2022: 62% - met goal   EDEMA:  Circumferential: Not formally assessed. Patient does exhibit right knee and lower leg swelling compared to left   MUSCLE LENGTH: Limitation in calf, hamstring, and quad flexibility   LOWER EXTREMITY ROM:   Active ROM Right eval Right  05/29/22 Right 06/06/2022 Right 06/16/22 Right  Knee flexion 100  108 112 112  Knee extension 10 lacking 7 lacking Lacking 7 Lacking 7 Lacking 6    LOWER EXTREMITY MMT:   MMT Right eval Left eval  Hip flexion 4- -  Hip extension 3 -  Hip abduction 3 -  Knee flexion 4 -  Knee extension 4- -    FUNCTIONAL TESTS:  Not assessed   GAIT: - 05/23/2022 Distance walked: 100 ft Assistive device utilized: SPC - lofstrand Level of assistance: Modified independence Comments: limitation in knee  extension with stance, minimal flexion of knee with swing, flat foot strike, antalgic on right, heavy reliance on walker for support     TODAY'S TREATMENT: [facility] Adult PT Treatment:                                                DATE: 06/23/22 Therapeutic Exercise: Recumbent bike L3 x 5 minutes while taking subjective Modified thomas stretch 2 x 60 sec on right Prone quad stretch 3 x 30 sec on right Seated hamstring stretch heel on stool 3 x 30 sec Slant board calf stretch 3 x 30 sec Knee extension machine DL 35# 2 x 15, SL 10# 2 x 10 Leg press (cybex) DL 120# 2 x 10, SL 40# 2 x 10 Manual: Right knee extension PROM   [facility] Adult PT Treatment:                                                 DATE: 06/20/22 Therapeutic Exercise: Recumbent bike level 3 x 5 minutes Prone quad stretch 2 x 30 sec RLE Resisted hip flexion 3 x 10 @ 50 lbs  Leg press eccentric SL 3 x 10 @ 40 lbs  SL eccentric squat to chair with airex pad and TRX for UE support 3 x 5 HS curl on stability ball 2 x 10  Self Care: Discussed used of KneeMD extension device with recommendation on duration/reps of stretching  [facility] Adult PT Treatment:                                                DATE: 06/16/22 Therapeutic Exercise: Recumbent bike level 3 x 5 minutes  Eccentric squat to table attempted; unable to control SL on the RLE due to weakness.  SL knee extension RLE 3 x 10 @ 5 lbs  SL HS curl RLE 3 x 10 @ 20 lbs  SL leg press RLE 3 x 10 @ 20 lbs  Sit to stand 1 x 10  Updated HEP  Manual Therapy: Rt knee PROM to tolerance into flexion/extension Patellar mobilizations all planes grade III  PATIENT EDUCATION:  Education details: HEP Person educated: Patient Education method: Explanation Education comprehension: verbalized understanding   HOME EXERCISE PROGRAM: Access Code: [email or web address]     ASSESSMENT: CLINICAL IMPRESSION: Patient tolerated therapy well with no adverse effects. Therapy continued to focus on progressing his knee mobility and strength. He does continue to exhibit limitations with knee motion but is progressing nicely with strengthening and his walking. No changes were made to his HEP this visit but he was instructed in various ways to perform LLLD stretching for right knee extension. Patient would benefit from continued skilled PT to progress his mobility and strength to reduce pain and maximize functional ability.      OBJECTIVE IMPAIRMENTS Abnormal gait, decreased activity tolerance, decreased balance, difficulty walking, decreased ROM, decreased strength, increased edema, impaired flexibility, and pain.    ACTIVITY LIMITATIONS lifting, bending, sitting, standing, squatting,  stairs, transfers, toileting, dressing, and locomotion level   PARTICIPATION LIMITATIONS: meal prep, cleaning, driving, shopping, community activity, and yard work  PERSONAL FACTORS Fitness, Past/current experiences, and Time since onset of injury/illness/exacerbation are also affecting patient's functional outcome.      GOALS: Goals reviewed with patient? Yes   SHORT TERM GOALS: Target date: 06/06/2022    Patient will be I with initial HEP in order to progress with therapy. Baseline: HEP provided at eval 06/06/2022: Goal status: MET   2.  PT will review FOTO with patient by 3rd visit in order to understand expected progress and outcome with therapy. Baseline: FOTO assessed at eval 06/06/2022: reviewed and reassessed Goal status: MET   3.  Patient will demonstrate right knee AROM 3-115 deg in order to improve gait and functional mobility. Baseline: right knee AROM 10-100 deg 06/06/2022: AROM 7-112 deg Goal status: PARTIALLY MET   4.  Patient will report right knee pain with activity </= 3/10 in order to reduce functional limitations Baseline: right knee pain 5/10 with activity 06/06/2022: patient denies any pain and minimal pain with activity Goal status: MET   LONG TERM GOALS: Target date: 07/04/2022    Patient will be I with final HEP to maintain progress from PT. Baseline: HEP provided at eval Goal status: INITIAL   2.  Patient will report >/= 60% status on FOTO to indicate improved functional ability. Baseline: 41% functional status 06/06/2022: 62% Goal status: MET   3.  Patient will demonstrate right knee AROM 0-120 deg in order to normalize gait and transfers without limitation Baseline:  right knee AROM 10-100 deg Goal status: INITIAL   4.  Patient will demonstrate right knee strength grossly = 5/5 MMT in order to improve walking and stair negotiation Baseline: right knee strength limitation (see above) Goal status: INITIAL   5.  Patient will ambulate  community level distances with LRAD in order to improve community access Baseline:patient ambulating household distances with RW Goal status: INITIAL   6.  Patient will report right knee pain with activity </= 1/10 in order to reduce functional limitations Baseline: right knee pain 5/10 with activity Goal status: INITIAL     PLAN: PT FREQUENCY: 1-2x/week   PT DURATION: 8 weeks   PLANNED INTERVENTIONS: Therapeutic exercises, Therapeutic activity, Neuromuscular re-education, Balance training, Gait training, Patient/Family education, Self Care, Joint mobilization, Joint manipulation, Stair training, Aquatic Therapy, Dry Needling, Cryotherapy, Moist heat, Taping, Vasopneumatic device, Manual therapy, and Re-evaluation   PLAN FOR NEXT SESSION:  review HEP and progress PRN, manual/stretching to improve right knee mobility, strength progression, gait training   Hilda Blades, PT, DPT, LAT, ATC 06/23/22  11:43 AM Phone: [phone] Fax: [phone]

## 2022-06-23 ENCOUNTER — Encounter: Payer: Self-pay | Admitting: Physical Therapy

## 2022-06-23 ENCOUNTER — Ambulatory Visit: Payer: BC Managed Care – PPO | Attending: Family Medicine | Admitting: Physical Therapy

## 2022-06-23 ENCOUNTER — Other Ambulatory Visit: Payer: Self-pay

## 2022-06-23 DIAGNOSIS — M25561 Pain in right knee: Secondary | ICD-10-CM | POA: Diagnosis present

## 2022-06-23 DIAGNOSIS — M25661 Stiffness of right knee, not elsewhere classified: Secondary | ICD-10-CM | POA: Insufficient documentation

## 2022-06-23 DIAGNOSIS — G8929 Other chronic pain: Secondary | ICD-10-CM | POA: Diagnosis present

## 2022-06-23 DIAGNOSIS — M6281 Muscle weakness (generalized): Secondary | ICD-10-CM | POA: Insufficient documentation

## 2022-06-23 DIAGNOSIS — R600 Localized edema: Secondary | ICD-10-CM | POA: Diagnosis present

## 2022-06-23 DIAGNOSIS — R2689 Other abnormalities of gait and mobility: Secondary | ICD-10-CM | POA: Insufficient documentation

## 2022-06-27 ENCOUNTER — Other Ambulatory Visit: Payer: Self-pay

## 2022-06-27 ENCOUNTER — Encounter: Payer: Self-pay | Admitting: Physical Therapy

## 2022-06-27 ENCOUNTER — Ambulatory Visit: Payer: BC Managed Care – PPO | Admitting: Physical Therapy

## 2022-06-27 DIAGNOSIS — G8929 Other chronic pain: Secondary | ICD-10-CM

## 2022-06-27 DIAGNOSIS — R600 Localized edema: Secondary | ICD-10-CM

## 2022-06-27 DIAGNOSIS — R2689 Other abnormalities of gait and mobility: Secondary | ICD-10-CM

## 2022-06-27 DIAGNOSIS — M25561 Pain in right knee: Secondary | ICD-10-CM | POA: Diagnosis not present

## 2022-06-27 DIAGNOSIS — M25661 Stiffness of right knee, not elsewhere classified: Secondary | ICD-10-CM

## 2022-06-27 DIAGNOSIS — M6281 Muscle weakness (generalized): Secondary | ICD-10-CM

## 2022-06-27 NOTE — Therapy (Signed)
OUTPATIENT PHYSICAL THERAPY TREATMENT NOTE   Patient Name: Johnny Bradley MRN: [id number] DOB:Sep 16, 1960, 61 y.o., male Today's Date: 06/27/2022  PCP: [name], MD   REFERRING PROVIDER: Aundra Dubin, PA-C   END OF SESSION:   PT End of Session - 06/27/22 1407     Visit Number 15    Number of Visits 21    Date for PT Re-Evaluation 08/08/22    Authorization Type BCBS / [employer]    PT Start Time 1402    PT Stop Time 1445    PT Time Calculation (min) 43 min    Activity Tolerance Patient tolerated treatment well    Behavior During Therapy WFL for tasks assessed/performed                          Past Medical History:  Diagnosis Date   Arthritis    Back pain    Hyperlipidemia    Hypertension    Shoulder pain    Left   Sleep apnea    Past Surgical History:  Procedure Laterality Date   KNEE ARTHROSCOPY Right    MCL Torn   PILONIDAL CYST EXCISION  1980   TOTAL KNEE ARTHROPLASTY Right 04/17/2022   Procedure: RIGHT TOTAL KNEE ARTHROPLASTY;  Surgeon: Leandrew Koyanagi, MD;  Location: [place];  Service: Orthopedics;  Laterality: Right;   WISDOM TOOTH EXTRACTION     Patient Active Problem List   Diagnosis Date Noted   Status post total left knee replacement 04/17/2022   Primary osteoarthritis of right knee 11/15/2021   Right shoulder pain 11/01/2021   Abdominal discomfort 11/01/2021   Bilateral knee pain 09/15/2020   OSA (obstructive sleep apnea) 02/03/2020   Chronic knee pain 02/03/2020   Family history of angiosarcoma 04/15/2019   Prediabetes 04/14/2019   HLD (hyperlipidemia) 10/02/2018   Subcutaneous nodule of right hand 10/02/2018   Skin lesion 05/29/2018   Hypertension 12/03/2017   Obesity 05/25/2017   Right low back pain 12/19/2014    REFERRING DIAG: Hx of total knee replacement, right  THERAPY DIAG:  Chronic pain of right knee  Stiffness of right knee, not elsewhere classified  Muscle weakness (generalized)  Other abnormalities  of gait and mobility  Localized edema  Rationale for Evaluation and Treatment Rehabilitation  PERTINENT HISTORY: None  PRECAUTIONS: None   SUBJECTIVE: Patient reports he continues to do well and feels he is still improving. He has been progressing his walking and does get some pain if he does too much.   PAIN:  Are you having pain? No  PATIENT GOALS: Progress to walking without device   OBJECTIVE: (objective measures completed at initial evaluation unless otherwise dated) PATIENT SURVEYS:  FOTO 41% functional status  06/06/2022: 62% - met goal   EDEMA:  Circumferential: Not formally assessed. Patient does exhibit right knee and lower leg swelling compared to left   MUSCLE LENGTH: Limitation in calf, hamstring, and quad flexibility   LOWER EXTREMITY ROM:   Active ROM Right eval Right  05/29/22 Right 06/06/2022 Right 06/16/22 Right 06/27/2022 Right  Knee flexion 100  108 112 112 115  Knee extension 10 lacking 7 lacking Lacking 7 Lacking 7 Lacking 6 Lacking 6    LOWER EXTREMITY MMT:   MMT Right eval Left eval Right 06/27/2022  Hip flexion 4- - 4  Hip extension 3 - 4-  Hip abduction 3 - 4-  Knee flexion 4 - 4  Knee extension 4- - 4  FUNCTIONAL TESTS:  Not assessed   GAIT: - 05/23/2022 Distance walked: 100 ft Assistive device utilized: SPC - lofstrand Level of assistance: Modified independence Comments: limitation in knee extension with stance, minimal flexion of knee with swing, flat foot strike, antalgic on right, heavy reliance on walker for support     TODAY'S TREATMENT: [facility] Adult PT Treatment:                                                DATE: 06/23/22 Therapeutic Exercise: NuStep L7 x 5 min with LE only, while taking subjective Standing knee flexion stretch foot on step 5 x 20 sec Standing hamstring stretch foot on step 5 x 20 sec SL leg press (cybex) 40# 3 x 10 each Forward 8" step-up 2 x 10 each Heel raises 2 x 20 Lateral band walk with  green at knees 4 x 20 down/back SLS 3 x 30 sec each Knee extension machine SL 10# 3 x 10 each   [facility] Adult PT Treatment:                                                DATE: 06/20/22 Therapeutic Exercise: Recumbent bike level 3 x 5 minutes Prone quad stretch 2 x 30 sec RLE Resisted hip flexion 3 x 10 @ 50 lbs  Leg press eccentric SL 3 x 10 @ 40 lbs  SL eccentric squat to chair with airex pad and TRX for UE support 3 x 5 HS curl on stability ball 2 x 10  Self Care: Discussed used of KneeMD extension device with recommendation on duration/reps of stretching  [facility] Adult PT Treatment:                                                DATE: 06/16/22 Therapeutic Exercise: Recumbent bike level 3 x 5 minutes  Eccentric squat to table attempted; unable to control SL on the RLE due to weakness.  SL knee extension RLE 3 x 10 @ 5 lbs  SL HS curl RLE 3 x 10 @ 20 lbs  SL leg press RLE 3 x 10 @ 20 lbs  Sit to stand 1 x 10  Updated HEP  Manual Therapy: Rt knee PROM to tolerance into flexion/extension Patellar mobilizations all planes grade III  PATIENT EDUCATION:  Education details: POC extension, HEP update Person educated: Patient Education method: Explanation, Handout Education comprehension: verbalized understanding   HOME EXERCISE PROGRAM: Access Code: [email or web address]     ASSESSMENT: CLINICAL IMPRESSION: Patient tolerated therapy well with no adverse effects. He demonstrates slight improvement in his right knee motion despite continuing to be limited with knee extension and flexion. His strength is improving and he is tolerating progressions in strength exercises in therapy. He has achieved his FOTO goal for functional ability. He does continue to walk with a cane for extended periods of walking and demonstrates deviations due to limited motion and strength of right knee. Updated his HEP this visit to progress strengthening for home. Patient would benefit from continued skilled PT to progress his  mobility and strength to reduce pain and  maximize functional ability, so will extend PT POC for 6 more weeks at 1x/week.      OBJECTIVE IMPAIRMENTS Abnormal gait, decreased activity tolerance, decreased balance, difficulty walking, decreased ROM, decreased strength, increased edema, impaired flexibility, and pain.    ACTIVITY LIMITATIONS lifting, bending, sitting, standing, squatting, stairs, transfers, toileting, dressing, and locomotion level   PARTICIPATION LIMITATIONS: meal prep, cleaning, driving, shopping, community activity, and yard work   [place], Past/current experiences, and Time since onset of injury/illness/exacerbation are also affecting patient's functional outcome.      GOALS: Goals reviewed with patient? Yes   SHORT TERM GOALS: Target date: 06/06/2022    Patient will be I with initial HEP in order to progress with therapy. Baseline: HEP provided at eval 06/06/2022: Goal status: MET   2.  PT will review FOTO with patient by 3rd visit in order to understand expected progress and outcome with therapy. Baseline: FOTO assessed at eval 06/06/2022: reviewed and reassessed Goal status: MET   3.  Patient will demonstrate right knee AROM 3-115 deg in order to improve gait and functional mobility. Baseline: right knee AROM 10-100 deg 06/06/2022: AROM 7-112 deg 06/27/2022: AROM 6 - 115 deg Goal status: PARTIALLY MET   4.  Patient will report right knee pain with activity </= 3/10 in order to reduce functional limitations Baseline: right knee pain 5/10 with activity 06/06/2022: patient denies any pain and minimal pain with activity Goal status: MET   LONG TERM GOALS: Target date: 08/08/2022   Patient will be I with final HEP to maintain progress from PT. Baseline: HEP provided at eval 06/27/2022: progressing Goal status: ONGOING   2.  Patient will report >/= 60% status on FOTO to indicate improved functional ability. Baseline: 41% functional  status 06/06/2022: 62% Goal status: MET   3.  Patient will demonstrate right knee AROM 0-120 deg in order to normalize gait and transfers without limitation Baseline:  right knee AROM 10-100 deg 06/27/2022: AROM 6 - 115 deg Goal status: PARTIALLY MET   4.  Patient will demonstrate right knee strength grossly = 5/5 MMT in order to improve walking and stair negotiation Baseline: right knee strength limitation (see above) 06/27/2022: right knee strength limitation (see above) Goal status: PARTIALLY MET   5.  Patient will ambulate community level distances with LRAD in order to improve community access [place] ambulating household distances with RW 06/27/2022: ambulating with SPC Goal status: ONGOING   6.  Patient will report right knee pain with activity </= 1/10 in order to reduce functional limitations Baseline: right knee pain 5/10 with activity 06/27/2022: patient does pain with activity Goal status: ONGOING     PLAN: PT FREQUENCY: 1x/week   PT DURATION: 6 weeks   PLANNED INTERVENTIONS: Therapeutic exercises, Therapeutic activity, Neuromuscular re-education, Balance training, Gait training, Patient/Family education, Self Care, Joint mobilization, Joint manipulation, Stair training, Aquatic Therapy, Dry Needling, Cryotherapy, Moist heat, Taping, Vasopneumatic device, Manual therapy, and Re-evaluation   PLAN FOR NEXT SESSION:  review HEP and progress PRN, manual/stretching to improve right knee mobility, strength progression, gait training   Hilda Blades, PT, DPT, LAT, ATC 06/27/22  3:20 PM Phone: [phone] Fax: [phone]

## 2022-06-27 NOTE — Patient Instructions (Addendum)
Access Code: [email or web address] URL: [email or web address] Date: 06/27/2022 Prepared by: Hilda Blades  Exercises - Seated Hamstring Stretch with Chair  - 1 x daily - 3 reps - 30 seconds hold - Standing Hamstring Stretch on Chair  - 1 x daily - 3 reps - 30 seconds hold - Standing Knee Flexion Stretch on Step  - 1 x daily - 3 reps - 30 seconds hold - Standing Gastroc Stretch at Counter  - 1 x daily - 3 sets - 30 seconds hold - Straight Leg Raise with Ankle Weight  - 1 x daily - 3 sets - 10 reps - Sidelying Hip Abduction with Ankle Weight  - 1 x daily - 3 sets - 10 reps - Prone Hamstring Curl with Ankle Weight  - 1 x daily - 3 sets - 10 reps - Seated Long Arc Quad with Ankle Weight  - 1 x daily - 3 sets - 10 reps - Seated Hamstring Curl with Anchored Resistance  - 1 x daily - 3 sets - 10 reps - Sit to Stand Without Arm Support  - 1 x daily - 3 sets - 10 reps - Heel Raises with Counter Support  - 1 x daily - 3 sets - 20 reps - Lunge with Counter Support  - 1 x daily - 3 sets - 10 reps - Standing Single Leg Stance with Counter Support  - 1 x daily - 3 reps - 30 seconds hold - Side Stepping with Resistance at Thighs  - 1 x daily - 3 sets - 10 reps

## 2022-06-28 ENCOUNTER — Emergency Department (HOSPITAL_COMMUNITY): Payer: BC Managed Care – PPO

## 2022-06-28 ENCOUNTER — Encounter (HOSPITAL_COMMUNITY): Payer: Self-pay

## 2022-06-28 ENCOUNTER — Other Ambulatory Visit: Payer: Self-pay

## 2022-06-28 ENCOUNTER — Emergency Department (HOSPITAL_COMMUNITY)
Admission: EM | Admit: 2022-06-28 | Discharge: 2022-06-28 | Discharge status: Against Medical Advice | Payer: BC Managed Care – PPO | Attending: Emergency Medicine | Admitting: Emergency Medicine

## 2022-06-28 DIAGNOSIS — R110 Nausea: Secondary | ICD-10-CM | POA: Insufficient documentation

## 2022-06-28 DIAGNOSIS — R0789 Other chest pain: Secondary | ICD-10-CM | POA: Insufficient documentation

## 2022-06-28 DIAGNOSIS — M7989 Other specified soft tissue disorders: Secondary | ICD-10-CM | POA: Diagnosis not present

## 2022-06-28 DIAGNOSIS — Z96651 Presence of right artificial knee joint: Secondary | ICD-10-CM | POA: Insufficient documentation

## 2022-06-28 DIAGNOSIS — R0602 Shortness of breath: Secondary | ICD-10-CM | POA: Diagnosis not present

## 2022-06-28 DIAGNOSIS — Z5321 Procedure and treatment not carried out due to patient leaving prior to being seen by health care provider: Secondary | ICD-10-CM | POA: Diagnosis not present

## 2022-06-28 LAB — BASIC METABOLIC PANEL
Anion gap: 11 (ref 5–15)
BUN: 13 mg/dL (ref 8–23)
CO2: 27 mmol/L (ref 22–32)
Calcium: 9.5 mg/dL (ref 8.9–10.3)
Chloride: 103 mmol/L (ref 98–111)
Creatinine, Ser: 0.91 mg/dL (ref 0.61–1.24)
GFR, Estimated: >60 mL/min (ref >60)
Glucose, Bld: 108 mg/dL — ABNORMAL HIGH (ref 70–99)
Potassium: 4 mmol/L (ref 3.5–5.1)
Sodium: 141 mmol/L (ref 135–145)

## 2022-06-28 LAB — TROPONIN I (HIGH SENSITIVITY): Troponin I (High Sensitivity): 4 ng/L (ref <18)

## 2022-06-28 LAB — CBC
HCT: 45.8 % (ref 39.0–52.0)
Hemoglobin: 15.2 g/dL (ref 13.0–17.0)
MCH: 30.5 pg (ref 26.0–34.0)
MCHC: 33.2 g/dL (ref 30.0–36.0)
MCV: 91.8 fL (ref 80.0–100.0)
Platelets: 181 10*3/uL (ref 150–400)
RBC: 4.99 MIL/uL (ref 4.22–5.81)
RDW: 12.5 % (ref 11.5–15.5)
WBC: 7.4 10*3/uL (ref 4.0–10.5)
nRBC: 0 % (ref 0.0–0.2)

## 2022-06-28 NOTE — ED Notes (Signed)
Patient states after checking his mychart, he is leaving and following up with his pcp

## 2022-06-28 NOTE — ED Triage Notes (Signed)
Complains of chest pain right on right sided that feels like pressure and heaviness.  Recent right knee replacement.

## 2022-06-28 NOTE — ED Provider Triage Note (Signed)
Emergency Medicine Provider Triage Evaluation Note  Johnny Bradley , a 61 y.o. male  was evaluated in triage.  Pt complains of right-sided chest pain which began last night, exacerbated this evening around noon while he was sitting in his car began to feel diaphoretic and felt somewhat nauseated with some radiation down his arm, he reports his pain level is a 4 out of 10.  He tried taking some Tylenol without any improvement in his symptoms.  He does not have any prior history of cardiac disease.  However, patient did have a total knee to the right knee by Dr. Wyline Copas in the month of August, reports his right leg is significantly bigger than his left.  Review of Systems  Positive: Leg swelling, chest pain, sob Negative: Fever, cough  Physical Exam  BP 133/87 (BP Location: Left Arm)   Pulse 90   Temp 98.3 F (36.8 C)   Resp 17   SpO2 96%  Gen:   Awake, no distress   Resp:  Normal effort  MSK:   Moves extremities without difficulty  Other:  R leg > L leg, lungs are clear to auscultation  Medical Decision Making  Medically screening exam initiated at 1:53 PM.  Appropriate orders placed.  [name] was informed that the remainder of the evaluation will be completed by another provider, this initial triage assessment does not replace that evaluation, and the importance of remaining in the ED until their evaluation is complete.  DVT study ordered    Janeece Fitting, PA-C 06/28/22 1358

## 2022-06-28 NOTE — ED Notes (Signed)
Patient called for vitals x2 

## 2022-06-29 ENCOUNTER — Other Ambulatory Visit: Payer: Self-pay | Admitting: Family Medicine

## 2022-06-29 ENCOUNTER — Ambulatory Visit: Payer: BC Managed Care – PPO

## 2022-06-29 ENCOUNTER — Telehealth: Payer: Self-pay | Admitting: Family Medicine

## 2022-06-29 ENCOUNTER — Encounter (HOSPITAL_BASED_OUTPATIENT_CLINIC_OR_DEPARTMENT_OTHER): Payer: Self-pay

## 2022-06-29 ENCOUNTER — Ambulatory Visit (INDEPENDENT_AMBULATORY_CARE_PROVIDER_SITE_OTHER): Payer: BC Managed Care – PPO | Admitting: Family Medicine

## 2022-06-29 ENCOUNTER — Ambulatory Visit (HOSPITAL_BASED_OUTPATIENT_CLINIC_OR_DEPARTMENT_OTHER)
Admission: RE | Admit: 2022-06-29 | Discharge: 2022-06-29 | Disposition: A | Discharge status: Home / Self Care | Payer: BC Managed Care – PPO | Source: Ambulatory Visit | Attending: Family Medicine | Admitting: Family Medicine

## 2022-06-29 ENCOUNTER — Encounter: Payer: Self-pay | Admitting: Family Medicine

## 2022-06-29 VITALS — BP 124/78 | HR 88 | Wt 263.6 lb

## 2022-06-29 DIAGNOSIS — R071 Chest pain on breathing: Secondary | ICD-10-CM | POA: Diagnosis present

## 2022-06-29 DIAGNOSIS — R0789 Other chest pain: Secondary | ICD-10-CM

## 2022-06-29 MED ORDER — IOHEXOL 350 MG/ML SOLN
100.0000 mL | Freq: Once | INTRAVENOUS | Status: AC | PRN
  Administered 2022-06-29: 100 mL via INTRAVENOUS

## 2022-06-29 MED ORDER — APIXABAN (ELIQUIS) VTE STARTER PACK (10MG AND 5MG): ORAL_TABLET | ORAL | 0 refills | Status: DC

## 2022-06-29 NOTE — Telephone Encounter (Signed)
Patient walked in and was worked in with Dr. Volanda Napoleon today, a EKG was done and he has swelling and was sent to the ED, think, but he was seen today. Terran Klinke,cma

## 2022-06-29 NOTE — Telephone Encounter (Signed)
Patient was in the ED for chest pain recently though it looks like he did not stay to be seen. Please call him and get details about what happened and get him scheduled for follow-up. Thanks.

## 2022-06-29 NOTE — Progress Notes (Addendum)
SUBJECTIVE:   Chief Complaint  Patient presents with   Chest Pain   Leg Swelling   HPI  Patient presents to nurse clinic and reports chest pain.  Patient reports had been in ED yesterday for chest pain and shortness of breath.  Had workup done but not seen a provider.  Given long wait time decided to leave.  Continues to have right-sided chest pain specifically with inspiration.  Denies any trauma or increase in activity.  Endorses having some diaphoreses yesterday while driving even though history was on.  Patient reports Tuesday he had some right-sided chest pain was not able to sleep at night.  Endorses pain moving from right side to his back, took Tylenol 652 tablets twice daily without much relief.  The following day notices some right arm tingling denies any precipitating factors.  Has had no exertional shortness of breath or shortness of breath at rest.  Also endorses that coughing and laughing increases chest pain.  No associated nausea or vomiting.  Recently had right lower extremity surgery and has been following up with PT.  Endorses swelling in right lower extremity and has noticed increase in swelling around right ankle.    06/28/2022 ED visit Reviewed labs.  Labs significant for elevated glucose.  Initial troponin negative.  Second troponin not drawn as patient left ED. ECG shows sinus rhythm, no ST elevation Chest x-ray showed borderline cardiomegaly but no acute cardiopulmonary process.  PERTINENT PMH / PSH: Hypertension OSA Hyperlipidemia Obesity class II Paternal heart disease   OBJECTIVE:  BP 124/78   Pulse 88   Wt 263 lb 9.6 oz (119.6 kg)   SpO2 99%   BMI 37.82 kg/m    Physical Exam Vitals reviewed.  Constitutional:      General: He is not in acute distress.    Appearance: He is not ill-appearing, toxic-appearing or diaphoretic.  Cardiovascular:     Rate and Rhythm: Normal rate and regular rhythm. No extrasystoles are present.    Chest Wall: PMI is not  displaced.     Pulses:          Radial pulses are 2+ on the right side and 2+ on the left side.       Dorsalis pedis pulses are 2+ on the right side and 2+ on the left side.     Heart sounds: Normal heart sounds. Heart sounds not distant. No murmur heard. Pulmonary:     Effort: Pulmonary effort is normal.     Breath sounds: Normal breath sounds.  Chest:     Chest wall: No mass, deformity or tenderness.  Musculoskeletal:     Right lower leg: Swelling present. Edema present.     Right ankle: Swelling present.  Skin:    General: Skin is warm.  Neurological:     Mental Status: He is alert and oriented to person, place, and time.  Psychiatric:        Mood and Affect: Mood normal. Mood is not anxious.        Behavior: Behavior normal. Behavior is not agitated.     ASSESSMENT/PLAN:  Chest pain on breathing Assessment & Plan: Patient presents to clinic with right-sided chest pain.  Onset 1 day ago with diaphoreses no nausea.  ED labs reassuring.  Initial troponin is negative with no changes on ECG.  Recent lower extremity surgery, increase in right lower extremity edema from normal and Well score intermittent cannot rule out PE.  Likely will need cardiac evaluation. -ECG today, no  changes from previous -CT angio stat -Start Eliquis, if CTA negative can discontinue -Strict return precautions provided -Follow-up with PCP for further evaluation  Orders: -     EKG 12-Lead -     CT Angio Chest Pulmonary Embolism (PE) W or WO Contrast; Future   PDMP reviewed  Return in about 1 week (around 07/06/2022).  Carollee Leitz, MD

## 2022-06-29 NOTE — Telephone Encounter (Signed)
Patient was seen by Volanda Napoleon.  Johnny Bradley,cma

## 2022-06-29 NOTE — Telephone Encounter (Signed)
Pt came into the office stating he went to the ER in [place] on yesterday for chest pain and other issues but the wait was so long that the pt left. Pt stated there were going to run some test on him to assess the issue. Pt stated he is still having a little bit of chest pain today. Pt is being triaged in office by Caryl Pina

## 2022-06-29 NOTE — Progress Notes (Signed)
  Medication Samples have been provided to the patient.  Drug name: Eliquis       Strength: 5        Qty: 28  LOT: [id number]  Exp.Date: 12/19/2023  Dosing instructions: Take 2 tablets ('10mg'$ ) twice per day for 7 days; then 5 mg 1 tab twice daily.    The patient has been instructed regarding the correct time, dose, and frequency of taking this medication, including desired effects and most common side effects.   Ferne Reus 12:41 PM 06/29/2022

## 2022-06-29 NOTE — Patient Instructions (Signed)
It was a pleasure meeting you today. Thank you for allowing me to take part in your health care.  Our goals for today as we discussed include:  I am concerned that you may have a developed a blood clot in your lungs with the new swelling in your lower leg this past week Start Eliquis 5 mg, take 2 tablets two times a day for 7 days.  On day 8 start Eliquis 5 mg 1 tablet two times a day. I have sent a prescription in for but do not pick it up until you hear from me.  You are scheduled for imaging of your lungs today at [facility].   You need to stay fasting and and to be there for 445 pm  Will call you with results once received.  Please follow-up with PCP in 1 week, call the clinic tomorrow to let me know how you are doing.    If you have any questions or concerns, please do not hesitate to call the office at [phone].  I look forward to our next visit and until then take care and stay safe.  Regards,   Carollee Leitz, MD   [facility]   Pulmonary Embolism  A pulmonary embolism (PE) is a sudden blockage or decrease of blood flow in one or both lungs that happens when a clot travels into the arteries of the lung (pulmonary arteries). Most blockages come from a blood clot that forms in the vein of a leg or arm (deep vein thrombosis, DVT) and travels to the lungs. A clot is blood that has thickened into a gel or solid. PE is a dangerous and life-threatening condition that needs to be treated right away. What are the causes? This condition is usually caused by a blood clot that forms in a vein and moves to the lungs. In rare cases, it may be caused by air, fat, part of a tumor, or other tissue that moves through the veins and into the lungs. What increases the risk? The following factors may make you more likely to develop this condition: Experiencing a traumatic injury, such as breaking a hip or leg. Having: A spinal cord injury. Major surgery, especially  hip or knee replacement, or surgery on parts of the nervous system or on the abdomen. A stroke. A blood-clotting disease. Long-term (chronic) lung or heart disease. Cancer, especially if you are being treated with chemotherapy. A central venous catheter. Taking medicines that contain estrogen. These include birth control pills and hormone replacement therapy. Being: Pregnant. In the period of time after your baby is delivered (postpartum). Older than age 6. Overweight. A smoker, especially if you have other risks. Not very active (sedentary), not being able to move at all, or spending long periods sitting, such as travel over 6 hours. You are also at a greater risk if you have a leg in a cast or splint. What are the signs or symptoms? Symptoms of this condition usually start suddenly and include: Shortness of breath during activity or at rest. Coughing, coughing up blood, or coughing up bloody mucus. Chest pain, back pain, or shoulder blade pain that gets worse with deep breaths. Rapid or irregular heartbeat. Feeling light-headed or dizzy, or fainting. Feeling anxious. Pain and swelling in a leg. This is a symptom of DVT, which can lead to PE. How is this diagnosed? This condition may be diagnosed based on your medical history, a physical exam, and tests. Tests may include: Blood tests.  An ECG (electrocardiogram) of the heart. A CT pulmonary angiogram. This test checks blood flow in and around your lungs. A ventilation-perfusion scan, also called a lung VQ scan. This test measures air flow and blood flow to the lungs. An ultrasound to check for a DVT. How is this treated? Treatment for this condition depends on many factors, such as the cause of your PE, your risk for bleeding or developing more clots, and other medical conditions you may have. Treatment aims to stop blood clots from forming or growing larger. In some cases, treatment may be aimed at breaking apart or removing the  blood clot. Treatment may include: Medicines, such as: Blood thinning medicines, also called anticoagulants, to stop clots from forming and growing. Medicines that break apart clots (fibrinolytics). Procedures, such as: Using a flexible tube to remove a blood clot (embolectomy) or to deliver medicine to destroy it (catheter-directed thrombolysis). Surgery to remove the clot (surgical embolectomy). This is rare. You may need a combination of immediate, long-term, and extended treatments. Your treatment may continue for several months (maintenance therapy) or longer depending on your medical conditions. You and your health care provider will work together to choose the treatment program that is best for you. Follow these instructions at home: Medicines Take over-the-counter and prescription medicines only as told by your health care provider. If you are taking blood thinners: Talk with your health care provider before you take any medicines that contain aspirin or NSAIDs, such as ibuprofen. These medicines increase your risk for dangerous bleeding. Take your medicine exactly as told, at the same time every day. Avoid activities that could cause injury or bruising, and follow instructions about how to prevent falls. Wear a medical alert bracelet or carry a card that lists what medicines you take. Understand what foods and drugs interact with any medicines that you are taking. General instructions Ask your health care provider when you may return to your normal activities. Avoid sitting or lying for a long time without moving. Maintain a healthy weight. Ask your health care provider what weight is healthy for you. Do not use any products that contain nicotine or tobacco. These products include cigarettes, chewing tobacco, and vaping devices, such as e-cigarettes. If you need help quitting, ask your health care provider. Talk with your health care provider about any travel plans. It is important to  make sure that you are still able to take your medicine while traveling. Keep all follow-up visits. This is important. Where to find more information American Lung Association: www.lung.org Centers for Disease Control and Prevention: http://www.wolf.info/ Contact a health care provider if: You missed a dose of your blood thinner medicine. You have a fever. Get help right away if: You have: New or increased pain, swelling, warmth, or redness in an arm or leg. Shortness of breath that gets worse during activity or at rest. Worsening chest pain. A rapid or irregular heartbeat. A severe headache. Vision changes. A serious fall or accident, or you hit your head. Blood in your vomit, stool, or urine. A cut that will not stop bleeding. You cough up blood. You feel light-headed or dizzy, and that feeling does not go away. You cannot move your arms or legs. You are confused or have memory loss. These symptoms may represent a serious problem that is an emergency. Do not wait to see if the symptoms will go away. Get medical help right away. Call your local emergency services (911 in the [place] Do not drive yourself to  the hospital. Summary A pulmonary embolism (PE) is a serious and potentially life-threatening condition. It happens when a blood clot from one part of the body travels to the arteries of the lung, causing a sudden blockage or decrease of blood flow to the lungs. This may result in shortness of breath, chest pain, dizziness, and fainting. Treatments for this condition usually include medicines to thin your blood (anticoagulants) or medicines to break apart blood clots. If you are given blood thinners, take your medicine exactly as told by your health care provider, at the same time every day. This is important. Understand what foods and drugs interact with any medicines that you are taking. If you have signs of PE or DVT, call your local emergency services (911 in the [place] This information is  not intended to replace advice given to you by your health care provider. Make sure you discuss any questions you have with your health care provider. Document Revised: 07/09/2020 Document Reviewed: 07/09/2020 Elsevier Patient Education  [place].

## 2022-06-30 NOTE — Telephone Encounter (Signed)
Noted  

## 2022-07-03 NOTE — Therapy (Signed)
OUTPATIENT PHYSICAL THERAPY TREATMENT NOTE   Patient Name: Johnny Bradley MRN: [id number] DOB:17-Nov-1960, 61 y.o., male Today's Date: 07/04/2022  PCP: [name], MD   REFERRING PROVIDER: Aundra Dubin, PA-C   END OF SESSION:   PT End of Session - 07/04/22 1015     Visit Number 16    Number of Visits 21    Date for PT Re-Evaluation 08/08/22    Authorization Type [employer] / [employer]    PT Start Time 1015    PT Stop Time 1058    PT Time Calculation (min) 43 min    Activity Tolerance Patient tolerated treatment well    Behavior During Therapy WFL for tasks assessed/performed                           Past Medical History:  Diagnosis Date   Arthritis    Back pain    Hyperlipidemia    Hypertension    Shoulder pain    Left   Sleep apnea    Past Surgical History:  Procedure Laterality Date   KNEE ARTHROSCOPY Right    [facility]   PILONIDAL CYST EXCISION  1980   TOTAL KNEE ARTHROPLASTY Right 04/17/2022   Procedure: RIGHT TOTAL KNEE ARTHROPLASTY;  Surgeon: Leandrew Koyanagi, MD;  Location: [facility];  Service: Orthopedics;  Laterality: Right;   WISDOM TOOTH EXTRACTION     Patient Active Problem List   Diagnosis Date Noted   Status post total left knee replacement 04/17/2022   Primary osteoarthritis of right knee 11/15/2021   Right shoulder pain 11/01/2021   Abdominal discomfort 11/01/2021   Bilateral knee pain 09/15/2020   OSA (obstructive sleep apnea) 02/03/2020   Chronic knee pain 02/03/2020   Family history of angiosarcoma 04/15/2019   Prediabetes 04/14/2019   HLD (hyperlipidemia) 10/02/2018   Subcutaneous nodule of right hand 10/02/2018   Skin lesion 05/29/2018   Hypertension 12/03/2017   Obesity 05/25/2017   Right low back pain 12/19/2014    REFERRING DIAG: Hx of total knee replacement, right  THERAPY DIAG:  Chronic pain of right knee  Stiffness of right knee, not elsewhere classified  Muscle weakness (generalized)  Other  abnormalities of gait and mobility  Localized edema  Rationale for Evaluation and Treatment Rehabilitation  PERTINENT HISTORY: None  PRECAUTIONS: None   SUBJECTIVE: Patient reports ED visit since last session secondary to chest pain. He had a full workup that was all negative in regards to cardiovascular findings. He has f/u with his PCP later this week, but is no longer experiencing any chest pain. Patient reports the knee is mostly just stiff, but overall doing good.   PAIN:  Are you having pain? No  PATIENT GOALS: Progress to walking without device   OBJECTIVE: (objective measures completed at initial evaluation unless otherwise dated) PATIENT SURVEYS:  FOTO 41% functional status  06/06/2022: 62% - met goal   EDEMA:  Circumferential: Not formally assessed. Patient does exhibit right knee and lower leg swelling compared to left   MUSCLE LENGTH: Limitation in calf, hamstring, and quad flexibility   LOWER EXTREMITY ROM:   Active ROM Right eval Right  05/29/22 Right 06/06/2022 Right 06/16/22 Right 06/27/2022 Right  Knee flexion 100  108 112 112 115  Knee extension 10 lacking 7 lacking Lacking 7 Lacking 7 Lacking 6 Lacking 6    LOWER EXTREMITY MMT:   MMT Right eval Left eval Right 06/27/2022  Hip flexion 4- - 4  Hip extension 3 - 4-  Hip abduction 3 - 4-  Knee flexion 4 - 4  Knee extension 4- - 4    FUNCTIONAL TESTS:  Not assessed   GAIT: - 05/23/2022 Distance walked: 100 ft Assistive device utilized: SPC - lofstrand Level of assistance: Modified independence Comments: limitation in knee extension with stance, minimal flexion of knee with swing, flat foot strike, antalgic on right, heavy reliance on walker for support     TODAY'S TREATMENT: [facility] Adult PT Treatment:                                                DATE: 07/04/22 Therapeutic Exercise: Recumbent bike level 3 x 5 minutes  Bodyweight squats 2 x 10  Lateral step down 2 inch x 10 LLE, unable  RLE TRX eccentric squat RLE 3 x 10  SL eccentric squat to raised table height RLE x 5  Resisted leg extension eccentric lowering with RLE 3 x 10 @ 15 lbs  Updated HEP  Neuromuscular re-ed: Tandem balance 2 x 30 sec each SLS 2 x 30 sec each     [facility] Adult PT Treatment:                                                DATE: 06/23/22 Therapeutic Exercise: NuStep L7 x 5 min with LE only, while taking subjective Standing knee flexion stretch foot on step 5 x 20 sec Standing hamstring stretch foot on step 5 x 20 sec SL leg press (cybex) 40# 3 x 10 each Forward 8" step-up 2 x 10 each Heel raises 2 x 20 Lateral band walk with green at knees 4 x 20 down/back SLS 3 x 30 sec each Knee extension machine SL 10# 3 x 10 each   [facility] Adult PT Treatment:                                                DATE: 06/20/22 Therapeutic Exercise: Recumbent bike level 3 x 5 minutes Prone quad stretch 2 x 30 sec RLE Resisted hip flexion 3 x 10 @ 50 lbs  Leg press eccentric SL 3 x 10 @ 40 lbs  SL eccentric squat to chair with airex pad and TRX for UE support 3 x 5 HS curl on stability ball 2 x 10  Self Care: Discussed used of KneeMD extension device with recommendation on duration/reps of stretching   PATIENT EDUCATION:  Education details: HEP update Person educated: Patient Education method: Explanation, Handout Education comprehension: verbalized understanding   HOME EXERCISE PROGRAM: Access Code: [email or web address]     ASSESSMENT: CLINICAL IMPRESSION: Patient tolerated therapy well with no adverse effects. Focused on progression of closed chain strengthening and static balance training. He demonstrates good form with DL squatting activity. He remains unable to complete lateral step down on the RLE secondary to weakness. His SL balance has significantly improved with ability to maintain for 30 seconds on the RLE. He has improved control with eccentric squat on the RLE with use of TRX compared to previous  sessions. HEP updated to include further balance  and strengthening.      OBJECTIVE IMPAIRMENTS Abnormal gait, decreased activity tolerance, decreased balance, difficulty walking, decreased ROM, decreased strength, increased edema, impaired flexibility, and pain.    ACTIVITY LIMITATIONS lifting, bending, sitting, standing, squatting, stairs, transfers, toileting, dressing, and locomotion level   PARTICIPATION LIMITATIONS: meal prep, cleaning, driving, shopping, community activity, and yard work   [place], Past/current experiences, and Time since onset of injury/illness/exacerbation are also affecting patient's functional outcome.      GOALS: Goals reviewed with patient? Yes   SHORT TERM GOALS: Target date: 06/06/2022    Patient will be I with initial HEP in order to progress with therapy. Baseline: HEP provided at eval 06/06/2022: Goal status: MET   2.  PT will review FOTO with patient by 3rd visit in order to understand expected progress and outcome with therapy. Baseline: FOTO assessed at eval 06/06/2022: reviewed and reassessed Goal status: MET   3.  Patient will demonstrate right knee AROM 3-115 deg in order to improve gait and functional mobility. Baseline: right knee AROM 10-100 deg 06/06/2022: AROM 7-112 deg 06/27/2022: AROM 6 - 115 deg Goal status: PARTIALLY MET   4.  Patient will report right knee pain with activity </= 3/10 in order to reduce functional limitations Baseline: right knee pain 5/10 with activity 06/06/2022: patient denies any pain and minimal pain with activity Goal status: MET   LONG TERM GOALS: Target date: 08/08/2022   Patient will be I with final HEP to maintain progress from PT. Baseline: HEP provided at eval 06/27/2022: progressing Goal status: ONGOING   2.  Patient will report >/= 60% status on FOTO to indicate improved functional ability. Baseline: 41% functional status 06/06/2022: 62% Goal status: MET   3.  Patient will  demonstrate right knee AROM 0-120 deg in order to normalize gait and transfers without limitation Baseline:  right knee AROM 10-100 deg 06/27/2022: AROM 6 - 115 deg Goal status: PARTIALLY MET   4.  Patient will demonstrate right knee strength grossly = 5/5 MMT in order to improve walking and stair negotiation Baseline: right knee strength limitation (see above) 06/27/2022: right knee strength limitation (see above) Goal status: PARTIALLY MET   5.  Patient will ambulate community level distances with LRAD in order to improve community access [place] ambulating household distances with RW 06/27/2022: ambulating with SPC Goal status: ONGOING   6.  Patient will report right knee pain with activity </= 1/10 in order to reduce functional limitations Baseline: right knee pain 5/10 with activity 06/27/2022: patient does pain with activity Goal status: ONGOING     PLAN: PT FREQUENCY: 1x/week   PT DURATION: 6 weeks   PLANNED INTERVENTIONS: Therapeutic exercises, Therapeutic activity, Neuromuscular re-education, Balance training, Gait training, Patient/Family education, Self Care, Joint mobilization, Joint manipulation, Stair training, Aquatic Therapy, Dry Needling, Cryotherapy, Moist heat, Taping, Vasopneumatic device, Manual therapy, and Re-evaluation   PLAN FOR NEXT SESSION:  review HEP and progress PRN, manual/stretching to improve right knee mobility, strength progression, gait training  Gwendolyn Grant, PT, DPT, ATC 07/04/22 11:00 AM

## 2022-07-04 ENCOUNTER — Ambulatory Visit: Payer: BC Managed Care – PPO

## 2022-07-04 DIAGNOSIS — R600 Localized edema: Secondary | ICD-10-CM

## 2022-07-04 DIAGNOSIS — R2689 Other abnormalities of gait and mobility: Secondary | ICD-10-CM

## 2022-07-04 DIAGNOSIS — G8929 Other chronic pain: Secondary | ICD-10-CM

## 2022-07-04 DIAGNOSIS — M25661 Stiffness of right knee, not elsewhere classified: Secondary | ICD-10-CM

## 2022-07-04 DIAGNOSIS — M25561 Pain in right knee: Secondary | ICD-10-CM | POA: Diagnosis not present

## 2022-07-04 DIAGNOSIS — M6281 Muscle weakness (generalized): Secondary | ICD-10-CM

## 2022-07-06 ENCOUNTER — Encounter: Payer: Self-pay | Admitting: Family Medicine

## 2022-07-06 ENCOUNTER — Telehealth: Payer: Self-pay

## 2022-07-06 ENCOUNTER — Other Ambulatory Visit: Payer: Self-pay

## 2022-07-06 ENCOUNTER — Ambulatory Visit (INDEPENDENT_AMBULATORY_CARE_PROVIDER_SITE_OTHER): Payer: BC Managed Care – PPO | Admitting: Family Medicine

## 2022-07-06 VITALS — BP 115/70 | HR 86 | Temp 97.8°F | Ht 70.0 in | Wt 267.6 lb

## 2022-07-06 DIAGNOSIS — Z1211 Encounter for screening for malignant neoplasm of colon: Secondary | ICD-10-CM

## 2022-07-06 DIAGNOSIS — R079 Chest pain, unspecified: Secondary | ICD-10-CM | POA: Diagnosis not present

## 2022-07-06 DIAGNOSIS — K769 Liver disease, unspecified: Secondary | ICD-10-CM | POA: Diagnosis not present

## 2022-07-06 HISTORY — DX: Chest pain, unspecified: R07.9

## 2022-07-06 MED ORDER — NA SULFATE-K SULFATE-MG SULF 17.5-3.13-1.6 GM/177ML PO SOLN: 1.0000 | Freq: Once | ORAL | 0 refills | Status: AC

## 2022-07-06 NOTE — Progress Notes (Signed)
Johnny Rumps, MD Phone: [phone]  Johnny Bradley is a 61 y.o. male who presents today for follow-up.  Chest pain: Patient reports an episode of chest pain. Notes it lasted intermittently for several days. He had associated diaphoresis and tingling down his right arm at one point. This occurred at rest. No exertional symptoms. He has chronic shortness of breath that resolves  with taking a deep breath. He went to the ED though did not wait to be seen. His labs were reassuring. He followed up with Dr Volanda Napoleon and had a CTA chest that was negative for PE. There was a possible liver cyst. He has not had any recurrence of his symptoms since the day after his ED visit. He notes he started taking tylenol for this and that has been beneficial. He reports he had been taking large amounts of ibuprofen while on a trip to [place]. He was taking up to 800 mg every 4 hours. He notes he was following the instructions on the medicine given to him in [place].  Patient notes his father had pacemaker and passed away in his sleep at age 51 though nobody else in his family has had any heart issues.  Social History   Tobacco Use  Smoking Status Never  Smokeless Tobacco Never    Current Outpatient Medications on File Prior to Visit  Medication Sig Dispense Refill   amLODipine (NORVASC) 5 MG tablet TAKE 1 TABLET BY MOUTH DAILY NEEDS APPOINTMENT 90 tablet 0   pravastatin (PRAVACHOL) 20 MG tablet TAKE 1 TABLET BY MOUTH ON MONDAY, WEDNESDAY, AND FRIDAY AS DIRECTED BY DOCTOR 36 tablet 1   No current facility-administered medications on file prior to visit.     ROS see history of present illness  Objective  Physical Exam Vitals:   07/06/22 0941 07/06/22 0959  BP: 115/80 115/70  Pulse: 86   Temp: 97.8 F (36.6 C)   SpO2: 97%     BP Readings from Last 3 Encounters:  07/06/22 115/70  06/29/22 124/78  06/28/22 (!) 128/90   Wt Readings from Last 3 Encounters:  07/06/22 267 lb 9.6 oz (121.4 kg)   06/29/22 263 lb 9.6 oz (119.6 kg)  06/28/22 258 lb (117 kg)    Physical Exam Constitutional:      General: He is not in acute distress.    Appearance: He is not diaphoretic.  Cardiovascular:     Rate and Rhythm: Normal rate and regular rhythm.     Heart sounds: Normal heart sounds.  Pulmonary:     Effort: Pulmonary effort is normal.     Breath sounds: Normal breath sounds.  Skin:    General: Skin is warm and dry.  Neurological:     Mental Status: He is alert.      Assessment/Plan: Please see individual problem list.  Problem List Items Addressed This Visit     Chest pain - Primary    Patient with recent chest pain.  Typical features include radiation and diaphoresis.  Atypical features include chest pain occurring at rest and not with exertion and resolution with Tylenol.  His work-up did not seem to indicate an acute cardiac cause though given his symptoms we will refer to cardiology for further evaluation.  If he has recurrence of symptoms he will seek medical attention.  Patient did wonder if he was okay to drive to the [place] with his wife for Thanksgiving.  At this time I advised it would be okay though if he had any additional symptoms he  would need to seek medical attention.      Relevant Orders   Ambulatory referral to Cardiology   Liver lesion    Note sent to GI to get their input on whether or not further evaluation is needed for this issue.      Other Visit Diagnoses     Colon cancer screening       Relevant Orders   Ambulatory referral to Gastroenterology        Return in about 4 months (around 11/04/2022) for CPE.   Johnny Rumps, MD [place]

## 2022-07-06 NOTE — Patient Instructions (Signed)
Nice to see you. GI should contact you to schedule your colonoscopy. I am going to check with GI regarding what follow-up may be needed for the possible liver cyst. Cardiology will contact you to set up a visit to evaluate your chest pain.

## 2022-07-06 NOTE — Assessment & Plan Note (Signed)
Patient with recent chest pain.  Typical features include radiation and diaphoresis.  Atypical features include chest pain occurring at rest and not with exertion and resolution with Tylenol.  His work-up did not seem to indicate an acute cardiac cause though given his symptoms we will refer to cardiology for further evaluation.  If he has recurrence of symptoms he will seek medical attention.  Patient did wonder if he was okay to drive to the [place] with his wife for Thanksgiving.  At this time I advised it would be okay though if he had any additional symptoms he would need to seek medical attention.

## 2022-07-06 NOTE — Telephone Encounter (Signed)
Gastroenterology Pre-Procedure Review  Request Date: 07/31/22 Requesting Physician: Dr. Marius Ditch  PATIENT REVIEW QUESTIONS: The patient responded to the following health history questions as indicated:    1. Are you having any GI issues? no 2. Do you have a personal history of Polyps? no 3. Do you have a family history of Colon Cancer or Polyps? yes (mother polyps) 4. Diabetes Mellitus? no 5. Joint replacements in the past 12 months? Right knee replacement 03/2022 6. Major health problems in the past 3 months?no 7. Any artificial heart valves, MVP, or defibrillator?no    MEDICATIONS & ALLERGIES:    Patient reports the following regarding taking any anticoagulation/antiplatelet therapy:   Plavix, Coumadin, Eliquis, Xarelto, Lovenox, Pradaxa, Brilinta, or Effient? no Aspirin? yes ('81mg'$  daily)  Patient confirms/reports the following medications:  Current Outpatient Medications  Medication Sig Dispense Refill   amLODipine (NORVASC) 5 MG tablet TAKE 1 TABLET BY MOUTH DAILY NEEDS APPOINTMENT 90 tablet 0   pravastatin (PRAVACHOL) 20 MG tablet TAKE 1 TABLET BY MOUTH ON MONDAY, WEDNESDAY, AND FRIDAY AS DIRECTED BY DOCTOR 36 tablet 1   No current facility-administered medications for this visit.    Patient confirms/reports the following allergies:  No Known Allergies  No orders of the defined types were placed in this encounter.   AUTHORIZATION INFORMATION Primary Insurance: 1D#: Group #:  Secondary Insurance: 1D#: Group #:  SCHEDULE INFORMATION: Date:07/31/22 Time: Location: [place]

## 2022-07-06 NOTE — Assessment & Plan Note (Signed)
Note sent to GI to get their input on whether or not further evaluation is needed for this issue.

## 2022-07-10 NOTE — Therapy (Signed)
OUTPATIENT PHYSICAL THERAPY TREATMENT NOTE   Patient Name: Johnny Bradley MRN: [id number] DOB:[birth year], 61 y.o., male Today's Date: 07/11/2022  PCP: [name], MD   REFERRING PROVIDER: Aundra Dubin, PA-C   END OF SESSION:   PT End of Session - 07/11/22 1447     Visit Number 17    Number of Visits 21    Date for PT Re-Evaluation 08/08/22    Authorization Type [employer] / [employer]    PT Start Time 1445    PT Stop Time 1530    PT Time Calculation (min) 45 min    Activity Tolerance Patient tolerated treatment well    Behavior During Therapy WFL for tasks assessed/performed                     Past Medical History:  Diagnosis Date   Arthritis    Back pain    Hyperlipidemia    Hypertension    Shoulder pain    Left   Sleep apnea    Past Surgical History:  Procedure Laterality Date   KNEE ARTHROSCOPY Right    [facility]   PILONIDAL CYST EXCISION  1980   TOTAL KNEE ARTHROPLASTY Right 04/17/2022   Procedure: RIGHT TOTAL KNEE ARTHROPLASTY;  Surgeon: Leandrew Koyanagi, MD;  Location: [facility];  Service: Orthopedics;  Laterality: Right;   WISDOM TOOTH EXTRACTION     Patient Active Problem List   Diagnosis Date Noted   Chest pain 07/06/2022   Liver lesion 07/06/2022   Status post total left knee replacement 04/17/2022   Primary osteoarthritis of right knee 11/15/2021   Right shoulder pain 11/01/2021   Abdominal discomfort 11/01/2021   Bilateral knee pain 09/15/2020   OSA (obstructive sleep apnea) 02/03/2020   Chronic knee pain 02/03/2020   Family history of angiosarcoma 04/15/2019   Prediabetes 04/14/2019   HLD (hyperlipidemia) 10/02/2018   Subcutaneous nodule of right hand 10/02/2018   Skin lesion 05/29/2018   Hypertension 12/03/2017   Obesity 05/25/2017   Right low back pain 12/19/2014    REFERRING DIAG: Hx of total knee replacement, right  THERAPY DIAG:  Chronic pain of right knee  Stiffness of right knee, not elsewhere classified  Muscle  weakness (generalized)  Other abnormalities of gait and mobility  Localized edema  Rationale for Evaluation and Treatment Rehabilitation  PERTINENT HISTORY: None  PRECAUTIONS: None   SUBJECTIVE: Patient reports he is doing well. He states he has good days and bad days. Some days he is painful and stiff while others he feels fine. He is unable to do the SL squat at home.  PAIN:  Are you having pain? No  PATIENT GOALS: Progress to walking without device   OBJECTIVE: (objective measures completed at initial evaluation unless otherwise dated) PATIENT SURVEYS:  FOTO 41% functional status  06/06/2022: 62% - met goal   EDEMA:  Circumferential: Not formally assessed. Patient does exhibit right knee and lower leg swelling compared to left   MUSCLE LENGTH: Limitation in calf, hamstring, and quad flexibility   LOWER EXTREMITY ROM:   Active ROM Right eval Right  05/29/22 Right 06/06/2022 Right 06/16/22 Right 06/27/2022 Right  Knee flexion 100  108 112 112 115  Knee extension 10 lacking 7 lacking Lacking 7 Lacking 7 Lacking 6 Lacking 6    LOWER EXTREMITY MMT:   MMT Right eval Left eval Right 06/27/2022 Right 07/11/2022  Hip flexion 4- - 4   Hip extension 3 - 4-   Hip abduction 3 -  4-   Knee flexion 4 - 4 4+  Knee extension 4- - 4 4+    FUNCTIONAL TESTS:  Not assessed   GAIT: - 05/23/2022 Distance walked: 100 ft Assistive device utilized: SPC - lofstrand Level of assistance: Modified independence Comments: limitation in knee extension with stance, minimal flexion of knee with swing, flat foot strike, antalgic on right, heavy reliance on walker for support     TODAY'S TREATMENT: [facility] Adult PT Treatment:                                                DATE: 07/11/22 Therapeutic Exercise: NuStep L8 x 5 min with LE while taking subjective Forward 8" step-up x 15 right Lateral 4" heel tap 2 x 10 Forward 4" heel tap x 10 right Leg press (cybex): DL 120# 2 x 10, SL 40#  2 x 10 right Rear foot elevated split squat 2 x 10 right Seated hamstring stretch foot in chair 3 x 30 sec right Slant board calf stretch 3 x 30 sec Standing TKE with black 2 x 10 right Sidelying hip abduction 2 x 15 right   [facility] Adult PT Treatment:                                                DATE: 07/04/22 Therapeutic Exercise: Recumbent bike level 3 x 5 minutes  Bodyweight squats 2 x 10  Lateral step down 2 inch x 10 LLE, unable RLE TRX eccentric squat RLE 3 x 10  SL eccentric squat to raised table height RLE x 5  Resisted leg extension eccentric lowering with RLE 3 x 10 @ 15 lbs  Updated HEP Neuromuscular re-ed: Tandem balance 2 x 30 sec each SLS 2 x 30 sec each   [facility] Adult PT Treatment:                                                DATE: 06/23/22 Therapeutic Exercise: NuStep L7 x 5 min with LE only, while taking subjective Standing knee flexion stretch foot on step 5 x 20 sec Standing hamstring stretch foot on step 5 x 20 sec SL leg press (cybex) 40# 3 x 10 each Forward 8" step-up 2 x 10 each Heel raises 2 x 20 Lateral band walk with green at knees 4 x 20 down/back SLS 3 x 30 sec each Knee extension machine SL 10# 3 x 10 each  PATIENT EDUCATION:  Education details: HEP Person educated: Patient Education method: Explanation Education comprehension: verbalized understanding   HOME EXERCISE PROGRAM: Access Code: [email or web address]     ASSESSMENT: CLINICAL IMPRESSION: Patient tolerated therapy well with no adverse effects. Therapy focused primarily on strength progression for the right knee. He does demonstrate improved knee strength but remains limited on the right especially with eccentric control such as going down stairs. His knee extension remains limited and patient encouraged continued stretching. No changes made to HEP but patient instructed he can perform rear foot elevated squat vs single leg squat. Patient would benefit from continued skilled PT to progress his  mobility and strength to  reduce pain and maximize functional ability.     OBJECTIVE IMPAIRMENTS Abnormal gait, decreased activity tolerance, decreased balance, difficulty walking, decreased ROM, decreased strength, increased edema, impaired flexibility, and pain.    ACTIVITY LIMITATIONS lifting, bending, sitting, standing, squatting, stairs, transfers, toileting, dressing, and locomotion level   PARTICIPATION LIMITATIONS: meal prep, cleaning, driving, shopping, community activity, and yard work   [place], Past/current experiences, and Time since onset of injury/illness/exacerbation are also affecting patient's functional outcome.      GOALS: Goals reviewed with patient? Yes   SHORT TERM GOALS: Target date: 06/06/2022    Patient will be I with initial HEP in order to progress with therapy. Baseline: HEP provided at eval 06/06/2022: Goal status: MET   2.  PT will review FOTO with patient by 3rd visit in order to understand expected progress and outcome with therapy. Baseline: FOTO assessed at eval 06/06/2022: reviewed and reassessed Goal status: MET   3.  Patient will demonstrate right knee AROM 3-115 deg in order to improve gait and functional mobility. Baseline: right knee AROM 10-100 deg 06/06/2022: AROM 7-112 deg 06/27/2022: AROM 6 - 115 deg Goal status: PARTIALLY MET   4.  Patient will report right knee pain with activity </= 3/10 in order to reduce functional limitations Baseline: right knee pain 5/10 with activity 06/06/2022: patient denies any pain and minimal pain with activity Goal status: MET   LONG TERM GOALS: Target date: 08/08/2022   Patient will be I with final HEP to maintain progress from PT. Baseline: HEP provided at eval 06/27/2022: progressing Goal status: ONGOING   2.  Patient will report >/= 60% status on FOTO to indicate improved functional ability. Baseline: 41% functional status 06/06/2022: 62% Goal status: MET   3.  Patient will  demonstrate right knee AROM 0-120 deg in order to normalize gait and transfers without limitation Baseline:  right knee AROM 10-100 deg 06/27/2022: AROM 6 - 115 deg Goal status: PARTIALLY MET   4.  Patient will demonstrate right knee strength grossly = 5/5 MMT in order to improve walking and stair negotiation Baseline: right knee strength limitation (see above) 06/27/2022: right knee strength limitation (see above) Goal status: PARTIALLY MET   5.  Patient will ambulate community level distances with LRAD in order to improve community access [place] ambulating household distances with RW 06/27/2022: ambulating with SPC Goal status: ONGOING   6.  Patient will report right knee pain with activity </= 1/10 in order to reduce functional limitations Baseline: right knee pain 5/10 with activity 06/27/2022: patient does pain with activity Goal status: ONGOING     PLAN: PT FREQUENCY: 1x/week   PT DURATION: 6 weeks   PLANNED INTERVENTIONS: Therapeutic exercises, Therapeutic activity, Neuromuscular re-education, Balance training, Gait training, Patient/Family education, Self Care, Joint mobilization, Joint manipulation, Stair training, Aquatic Therapy, Dry Needling, Cryotherapy, Moist heat, Taping, Vasopneumatic device, Manual therapy, and Re-evaluation   PLAN FOR NEXT SESSION:  review HEP and progress PRN, manual/stretching to improve right knee mobility, strength progression, gait training   Hilda Blades, PT, DPT, LAT, ATC 07/11/22  3:34 PM Phone: [phone] Fax: [phone]

## 2022-07-11 ENCOUNTER — Other Ambulatory Visit: Payer: Self-pay

## 2022-07-11 ENCOUNTER — Encounter: Payer: Self-pay | Admitting: Physical Therapy

## 2022-07-11 ENCOUNTER — Ambulatory Visit: Payer: BC Managed Care – PPO | Admitting: Physical Therapy

## 2022-07-11 DIAGNOSIS — R600 Localized edema: Secondary | ICD-10-CM

## 2022-07-11 DIAGNOSIS — M25661 Stiffness of right knee, not elsewhere classified: Secondary | ICD-10-CM

## 2022-07-11 DIAGNOSIS — M6281 Muscle weakness (generalized): Secondary | ICD-10-CM

## 2022-07-11 DIAGNOSIS — G8929 Other chronic pain: Secondary | ICD-10-CM

## 2022-07-11 DIAGNOSIS — M25561 Pain in right knee: Secondary | ICD-10-CM | POA: Diagnosis not present

## 2022-07-11 DIAGNOSIS — R2689 Other abnormalities of gait and mobility: Secondary | ICD-10-CM

## 2022-07-13 DIAGNOSIS — R071 Chest pain on breathing: Secondary | ICD-10-CM | POA: Insufficient documentation

## 2022-07-13 NOTE — Assessment & Plan Note (Deleted)
Patient presents to clinic with right-sided chest pain.  Onset 1 day ago with diaphoreses no nausea.  ED labs reassuring.  Initial troponin is negative with no changes on ECG.  Recent lower extremity surgery, increase in right lower extremity edema from normal and Well score intermittent cannot rule out PE.  Likely will need cardiac evaluation. -CT angio stat -Strict return precautions provided -Follow-up with PCP for further evaluation

## 2022-07-13 NOTE — Assessment & Plan Note (Addendum)
Patient presents to clinic with right-sided chest pain.  Onset 1 day ago with diaphoreses no nausea.  ED labs reassuring.  Initial troponin is negative with no changes on ECG.  Recent lower extremity surgery, increase in right lower extremity edema from normal and Well score intermittent cannot rule out PE.  Likely will need cardiac evaluation. -ECG today, no changes from previous  -CT angio stat -Start Eliquis, if CTA negative can discontinue -Strict return precautions provided -Follow-up with PCP for further evaluation

## 2022-07-18 ENCOUNTER — Other Ambulatory Visit: Payer: Self-pay

## 2022-07-18 ENCOUNTER — Ambulatory Visit: Payer: BC Managed Care – PPO | Admitting: Physical Therapy

## 2022-07-18 ENCOUNTER — Encounter: Payer: Self-pay | Admitting: Physical Therapy

## 2022-07-18 DIAGNOSIS — M25661 Stiffness of right knee, not elsewhere classified: Secondary | ICD-10-CM

## 2022-07-18 DIAGNOSIS — M6281 Muscle weakness (generalized): Secondary | ICD-10-CM

## 2022-07-18 DIAGNOSIS — R600 Localized edema: Secondary | ICD-10-CM

## 2022-07-18 DIAGNOSIS — R2689 Other abnormalities of gait and mobility: Secondary | ICD-10-CM

## 2022-07-18 DIAGNOSIS — M25561 Pain in right knee: Secondary | ICD-10-CM | POA: Diagnosis not present

## 2022-07-18 DIAGNOSIS — G8929 Other chronic pain: Secondary | ICD-10-CM

## 2022-07-18 NOTE — Therapy (Signed)
OUTPATIENT PHYSICAL THERAPY TREATMENT NOTE   Patient Name: Johnny Bradley MRN: [id number] DOB:11/21/1960, 61 y.o., male Today's Date: 07/18/2022  PCP: Sonnenberg, Eric G, MD   REFERRING PROVIDER: Stanbery, Mary L, PA-C   END OF SESSION:   PT End of Session - 07/18/22 1053     Visit Number 18    Number of Visits 21    Date for PT Re-Evaluation 08/08/22    Authorization Type BCBS / [employer]    PT Start Time 1045    PT Stop Time 1130    PT Time Calculation (min) 45 min    Activity Tolerance Patient tolerated treatment well    Behavior During Therapy WFL for tasks assessed/performed                      Past Medical History:  Diagnosis Date   Arthritis    Back pain    Hyperlipidemia    Hypertension    Shoulder pain    Left   Sleep apnea    Past Surgical History:  Procedure Laterality Date   KNEE ARTHROSCOPY Right    MCL Torn   PILONIDAL CYST EXCISION  1980   TOTAL KNEE ARTHROPLASTY Right 04/17/2022   Procedure: RIGHT TOTAL KNEE ARTHROPLASTY;  Surgeon: Xu, Naiping M, MD;  Location: [facility];  Service: Orthopedics;  Laterality: Right;   WISDOM TOOTH EXTRACTION     Patient Active Problem List   Diagnosis Date Noted   Chest pain on breathing 07/13/2022   Chest pain 07/06/2022   Liver lesion 07/06/2022   Status post total left knee replacement 04/17/2022   Primary osteoarthritis of right knee 11/15/2021   Right shoulder pain 11/01/2021   Abdominal discomfort 11/01/2021   Bilateral knee pain 09/15/2020   OSA (obstructive sleep apnea) 02/03/2020   Chronic knee pain 02/03/2020   Family history of angiosarcoma 04/15/2019   Prediabetes 04/14/2019   HLD (hyperlipidemia) 10/02/2018   Subcutaneous nodule of right hand 10/02/2018   Skin lesion 05/29/2018   Hypertension 12/03/2017   Obesity 05/25/2017   Right low back pain 12/19/2014    REFERRING DIAG: Hx of total knee replacement, right  THERAPY DIAG:  Chronic pain of right knee  Stiffness of right  knee, not elsewhere classified  Muscle weakness (generalized)  Other abnormalities of gait and mobility  Localized edema  Rationale for Evaluation and Treatment Rehabilitation  PERTINENT HISTORY: None  PRECAUTIONS: None   SUBJECTIVE: Patient reports he is doing well. He can tell his right knee is getting stronger, he is now able to raise the other leg up onto a chair to get dressed and stuff, where he is fully supporting his weight on the surgical leg.  PAIN:  Are you having pain? No  PATIENT GOALS: Progress to walking without device   OBJECTIVE: (objective measures completed at initial evaluation unless otherwise dated) PATIENT SURVEYS:  FOTO 41% functional status  06/06/2022: 62% - met goal   EDEMA:  Circumferential: Not formally assessed. Patient does exhibit right knee and lower leg swelling compared to left   MUSCLE LENGTH: Limitation in calf, hamstring, and quad flexibility   LOWER EXTREMITY ROM:   Active ROM Right eval Right  05/29/22 Right 06/06/2022 Right 06/16/22 Right 06/27/2022 Right  Knee flexion 100  108 112 112 115  Knee extension 10 lacking 7 lacking Lacking 7 Lacking 7 Lacking 6 Lacking 6    LOWER EXTREMITY MMT:   MMT Right eval Left eval Right 06/27/2022 Right 07/11/2022 Right 07/18/2022    Hip flexion 4- - 4  4+  Hip extension 3 - 4-    Hip abduction 3 - 4-    Knee flexion 4 - 4 4+ 4+  Knee extension 4- - 4 4+ 4+    FUNCTIONAL TESTS:  Not assessed   GAIT: - 05/23/2022 Distance walked: 100 ft Assistive device utilized: SPC - lofstrand Level of assistance: Modified independence Comments: limitation in knee extension with stance, minimal flexion of knee with swing, flat foot strike, antalgic on right, heavy reliance on walker for support     TODAY'S TREATMENT: [facility] Adult PT Treatment:                                                DATE: 07/18/22 Therapeutic Exercise: Recumbent bike L3 x 5 min while taking subjective Leg press  (cybex): DL 140# 3 x 10, SL 40# 2 x 10 each Hip abduction machine 25# 2 x 10 each Forward 8" step-up holding 15# on ipsilateral side with contralateral support 2 x 10 each Heel raises 2 x 20 SLS 3 x 30 sec each TRX squat 2 x 10 Knee extension machine DL 35# 2 x 15, SL 15# 2 x 10 each   [facility] Adult PT Treatment:                                                DATE: 07/11/22 Therapeutic Exercise: NuStep L8 x 5 min with LE while taking subjective Forward 8" step-up x 15 right Lateral 4" heel tap 2 x 10 Forward 4" heel tap x 10 right Leg press (cybex): DL 120# 2 x 10, SL 40# 2 x 10 right Rear foot elevated split squat 2 x 10 right Seated hamstring stretch foot in chair 3 x 30 sec right Slant board calf stretch 3 x 30 sec Standing TKE with black 2 x 10 right Sidelying hip abduction 2 x 15 right  [facility] Adult PT Treatment:                                                DATE: 07/04/22 Therapeutic Exercise: Recumbent bike level 3 x 5 minutes  Bodyweight squats 2 x 10  Lateral step down 2 inch x 10 LLE, unable RLE TRX eccentric squat RLE 3 x 10  SL eccentric squat to raised table height RLE x 5  Resisted leg extension eccentric lowering with RLE 3 x 10 @ 15 lbs  Updated HEP Neuromuscular re-ed: Tandem balance 2 x 30 sec each SLS 2 x 30 sec each   PATIENT EDUCATION:  Education details: HEP Person educated: Patient Education method: Explanation Education comprehension: Verbalized understanding   HOME EXERCISE PROGRAM: Access Code: [email or web address]     ASSESSMENT: CLINICAL IMPRESSION: Patient tolerated therapy well with no adverse effects. Therapy focused primarily on progressing knee strength and stability with good tolerance. He continues to demonstrate gross strength deficit of the right knee but is progressing nicely in therapy and increasing weight with strengthening exercises. No changes made to HEP this visit. Patient would benefit from continued skilled PT to progress his mobility and    strength to reduce pain and maximize functional ability.     OBJECTIVE IMPAIRMENTS Abnormal gait, decreased activity tolerance, decreased balance, difficulty walking, decreased ROM, decreased strength, increased edema, impaired flexibility, and pain.    ACTIVITY LIMITATIONS lifting, bending, sitting, standing, squatting, stairs, transfers, toileting, dressing, and locomotion level   PARTICIPATION LIMITATIONS: meal prep, cleaning, driving, shopping, community activity, and yard work   PERSONAL FACTORS Fitness, Past/current experiences, and Time since onset of injury/illness/exacerbation are also affecting patient's functional outcome.      GOALS: Goals reviewed with patient? Yes   SHORT TERM GOALS: Target date: 06/06/2022    Patient will be I with initial HEP in order to progress with therapy. Baseline: HEP provided at eval 06/06/2022: Goal status: MET   2.  PT will review FOTO with patient by 3rd visit in order to understand expected progress and outcome with therapy. Baseline: FOTO assessed at eval 06/06/2022: reviewed and reassessed Goal status: MET   3.  Patient will demonstrate right knee AROM 3-115 deg in order to improve gait and functional mobility. Baseline: right knee AROM 10-100 deg 06/06/2022: AROM 7-112 deg 06/27/2022: AROM 6 - 115 deg Goal status: PARTIALLY MET   4.  Patient will report right knee pain with activity </= 3/10 in order to reduce functional limitations Baseline: right knee pain 5/10 with activity 06/06/2022: patient denies any pain and minimal pain with activity Goal status: MET   LONG TERM GOALS: Target date: 08/08/2022   Patient will be I with final HEP to maintain progress from PT. Baseline: HEP provided at eval 06/27/2022: progressing Goal status: ONGOING   2.  Patient will report >/= 60% status on FOTO to indicate improved functional ability. Baseline: 41% functional status 06/06/2022: 62% Goal status: MET   3.  Patient will demonstrate  right knee AROM 0-120 deg in order to normalize gait and transfers without limitation Baseline:  right knee AROM 10-100 deg 06/27/2022: AROM 6 - 115 deg Goal status: PARTIALLY MET   4.  Patient will demonstrate right knee strength grossly = 5/5 MMT in order to improve walking and stair negotiation Baseline: right knee strength limitation (see above) 06/27/2022: right knee strength limitation (see above) Goal status: PARTIALLY MET   5.  Patient will ambulate community level distances with LRAD in order to improve community access Baseline:patient ambulating household distances with RW 06/27/2022: ambulating with SPC Goal status: ONGOING   6.  Patient will report right knee pain with activity </= 1/10 in order to reduce functional limitations Baseline: right knee pain 5/10 with activity 06/27/2022: patient does pain with activity Goal status: ONGOING     PLAN: PT FREQUENCY: 1x/week   PT DURATION: 6 weeks   PLANNED INTERVENTIONS: Therapeutic exercises, Therapeutic activity, Neuromuscular re-education, Balance training, Gait training, Patient/Family education, Self Care, Joint mobilization, Joint manipulation, Stair training, Aquatic Therapy, Dry Needling, Cryotherapy, Moist heat, Taping, Vasopneumatic device, Manual therapy, and Re-evaluation   PLAN FOR NEXT SESSION:  review HEP and progress PRN, manual/stretching to improve right knee mobility, strength progression, gait training   Campbell Brown, PT, DPT, LAT, ATC 07/18/22  11:30 AM Phone: [phone] Fax: [phone]        

## 2022-07-25 ENCOUNTER — Ambulatory Visit (INDEPENDENT_AMBULATORY_CARE_PROVIDER_SITE_OTHER): Payer: BC Managed Care – PPO | Admitting: Physician Assistant

## 2022-07-25 ENCOUNTER — Encounter: Payer: Self-pay | Admitting: Orthopaedic Surgery

## 2022-07-25 ENCOUNTER — Ambulatory Visit: Payer: BC Managed Care – PPO | Attending: Family Medicine

## 2022-07-25 DIAGNOSIS — Z96651 Presence of right artificial knee joint: Secondary | ICD-10-CM

## 2022-07-25 DIAGNOSIS — R600 Localized edema: Secondary | ICD-10-CM | POA: Diagnosis present

## 2022-07-25 DIAGNOSIS — M25661 Stiffness of right knee, not elsewhere classified: Secondary | ICD-10-CM | POA: Diagnosis present

## 2022-07-25 DIAGNOSIS — M6281 Muscle weakness (generalized): Secondary | ICD-10-CM | POA: Diagnosis present

## 2022-07-25 DIAGNOSIS — M25561 Pain in right knee: Secondary | ICD-10-CM | POA: Insufficient documentation

## 2022-07-25 DIAGNOSIS — R2689 Other abnormalities of gait and mobility: Secondary | ICD-10-CM | POA: Insufficient documentation

## 2022-07-25 DIAGNOSIS — G8929 Other chronic pain: Secondary | ICD-10-CM | POA: Diagnosis present

## 2022-07-25 MED ORDER — AMOXICILLIN 500 MG PO CAPS: ORAL_CAPSULE | ORAL | 2 refills | Status: DC

## 2022-07-25 NOTE — Progress Notes (Unsigned)
Post-Op Visit Note   Patient: Johnny Bradley           Date of Birth: 06-Aug-1961           MRN: [id number] Visit Date: 07/25/2022 PCP: [name], MD   Assessment & Plan:  Chief Complaint:  Chief Complaint  Patient presents with   Right Knee - Follow-up    Right total knee arthroplasty 04/17/2022   Visit Diagnoses:  1. Status post total right knee replacement     Plan: Patient is a pleasant 61 year old gentleman who comes in today 3 months status post right total knee replacement 04/17/2022.  He has been doing relatively well.  He is still in physical therapy and has 3 weeks left.  He is taking Tylenol once daily for his pain.  Examination of the right knee reveals a fully healed surgical scar without complication.  Range of motion 10 to 115 degrees.  He is stable to valgus varus stress.  He is neurovascular intact distally.  At this point, he will continue with formal physical therapy as well as a home exercise program.  Continue specifically working on knee extension.  He will continue to advance with activity as tolerated.  Follow-up in 3 months for recheck.  Dental prophylaxis reinforced.  Follow-Up Instructions: Return in about 3 months (around 10/24/2022).   Orders:  No orders of the defined types were placed in this encounter.  Meds ordered this encounter  Medications   amoxicillin (AMOXIL) 500 MG capsule    Sig: Take four pills one hour prior to dental work/colonoscopy    Dispense:  16 capsule    Refill:  2    Imaging: No new imaging  PMFS History: Patient Active Problem List   Diagnosis Date Noted   Chest pain on breathing 07/13/2022   Chest pain 07/06/2022   Liver lesion 07/06/2022   Status post total left knee replacement 04/17/2022   Primary osteoarthritis of right knee 11/15/2021   Right shoulder pain 11/01/2021   Abdominal discomfort 11/01/2021   Bilateral knee pain 09/15/2020   OSA (obstructive sleep apnea) 02/03/2020   Chronic knee pain  02/03/2020   Family history of angiosarcoma 04/15/2019   Prediabetes 04/14/2019   HLD (hyperlipidemia) 10/02/2018   Subcutaneous nodule of right hand 10/02/2018   Skin lesion 05/29/2018   Hypertension 12/03/2017   Obesity 05/25/2017   Right low back pain 12/19/2014   Past Medical History:  Diagnosis Date   Arthritis    Back pain    Hyperlipidemia    Hypertension    Shoulder pain    Left   Sleep apnea     Family History  Problem Relation Age of Onset   Heart disease Father    Stroke Paternal Grandmother     Past Surgical History:  Procedure Laterality Date   KNEE ARTHROSCOPY Right    MCL Torn   PILONIDAL CYST EXCISION  1980   TOTAL KNEE ARTHROPLASTY Right 04/17/2022   Procedure: RIGHT TOTAL KNEE ARTHROPLASTY;  Surgeon: Leandrew Koyanagi, MD;  Location: [facility];  Service: Orthopedics;  Laterality: Right;   WISDOM TOOTH EXTRACTION     Social History   Occupational History   Not on file  Tobacco Use   Smoking status: Never   Smokeless tobacco: Never  Vaping Use   Vaping Use: Never used  Substance and Sexual Activity   Alcohol use: Yes    Alcohol/week: 12.0 standard drinks of alcohol    Types: 12 Cans of beer per  week    Comment: On weekends- [address]    Drug use: No   Sexual activity: Yes    Partners: Female    Comment: Wife

## 2022-07-25 NOTE — Therapy (Signed)
OUTPATIENT PHYSICAL THERAPY TREATMENT NOTE   Patient Name: Johnny Bradley MRN: [id number] DOB:[birth year], 61 y.o., male Today's Date: 07/25/2022  PCP: [name], MD   REFERRING PROVIDER: Aundra Dubin, PA-C   END OF SESSION:   PT End of Session - 07/25/22 1014     Visit Number 19    Number of Visits 21    Date for PT Re-Evaluation 08/08/22    Authorization Type BCBS / [employer]    PT Start Time 1014    PT Stop Time 1056    PT Time Calculation (min) 42 min    Activity Tolerance Patient tolerated treatment well    Behavior During Therapy WFL for tasks assessed/performed                       Past Medical History:  Diagnosis Date   Arthritis    Back pain    Hyperlipidemia    Hypertension    Shoulder pain    Left   Sleep apnea    Past Surgical History:  Procedure Laterality Date   KNEE ARTHROSCOPY Right    MCL Torn   PILONIDAL CYST EXCISION  1980   TOTAL KNEE ARTHROPLASTY Right 04/17/2022   Procedure: RIGHT TOTAL KNEE ARTHROPLASTY;  Surgeon: Leandrew Koyanagi, MD;  Location: [facility];  Service: Orthopedics;  Laterality: Right;   WISDOM TOOTH EXTRACTION     Patient Active Problem List   Diagnosis Date Noted   Chest pain on breathing 07/13/2022   Chest pain 07/06/2022   Liver lesion 07/06/2022   Status post total left knee replacement 04/17/2022   Primary osteoarthritis of right knee 11/15/2021   Right shoulder pain 11/01/2021   Abdominal discomfort 11/01/2021   Bilateral knee pain 09/15/2020   OSA (obstructive sleep apnea) 02/03/2020   Chronic knee pain 02/03/2020   Family history of angiosarcoma 04/15/2019   Prediabetes 04/14/2019   HLD (hyperlipidemia) 10/02/2018   Subcutaneous nodule of right hand 10/02/2018   Skin lesion 05/29/2018   Hypertension 12/03/2017   Obesity 05/25/2017   Right low back pain 12/19/2014    REFERRING DIAG: Hx of total knee replacement, right  THERAPY DIAG:  Chronic pain of right knee  Stiffness of right  knee, not elsewhere classified  Muscle weakness (generalized)  Other abnormalities of gait and mobility  Localized edema  Rationale for Evaluation and Treatment Rehabilitation  PERTINENT HISTORY: None  PRECAUTIONS: None   SUBJECTIVE: Patient reports no pain in the knee currently. He had a bad day over the weekend where it was painful and tough to bend/straighten the knee, but this only lasted one day.   PAIN:  Are you having pain? No  PATIENT GOALS: Progress to walking without device   OBJECTIVE: (objective measures completed at initial evaluation unless otherwise dated) PATIENT SURVEYS:  FOTO 41% functional status  06/06/2022: 62% - met goal   EDEMA:  Circumferential: Not formally assessed. Patient does exhibit right knee and lower leg swelling compared to left   MUSCLE LENGTH: Limitation in calf, hamstring, and quad flexibility   LOWER EXTREMITY ROM:   Active ROM Right eval Right  05/29/22 Right 06/06/2022 Right 06/16/22 Right 06/27/2022 Right  Knee flexion 100  108 112 112 115  Knee extension 10 lacking 7 lacking Lacking 7 Lacking 7 Lacking 6 Lacking 6    LOWER EXTREMITY MMT:   MMT Right eval Left eval Right 06/27/2022 Right 07/11/2022 Right 07/18/2022  Hip flexion 4- - 4  4+  Hip extension  3 - 4-    Hip abduction 3 - 4-    Knee flexion 4 - 4 4+ 4+  Knee extension 4- - 4 4+ 4+    FUNCTIONAL TESTS:  Not assessed   GAIT: - 05/23/2022 Distance walked: 100 ft Assistive device utilized: SPC - lofstrand Level of assistance: Modified independence Comments: limitation in knee extension with stance, minimal flexion of knee with swing, flat foot strike, antalgic on right, heavy reliance on walker for support     TODAY'S TREATMENT: [facility] Adult PT Treatment:                                                DATE: 07/25/22 Therapeutic Exercise: Bike level 3 x 5 minutes  Cybex hip flexion 3 x 10 @ 50 lbs  Forward step ups RLE leading 1 x 10 step up; 2 x 10 knee  driver; 8 inch step  Lateral band walks blue band at shins in // bars 3 sets d/b Bodyweight squats 2 x 10  Resisted knee extension RLE 3 x 10 @ 20 lbs  Updated HEP   Neuromuscular re-ed: Bosu balance 3 x 30 sec  Marching on airex 2 x 10  Tandem on airex 2 x 30 sec each  SLS 30 sec LLE; 3 sec RLE   [facility] Adult PT Treatment:                                                DATE: 07/18/22 Therapeutic Exercise: Recumbent bike L3 x 5 min while taking subjective Leg press (cybex): DL 140# 3 x 10, SL 40# 2 x 10 each Hip abduction machine 25# 2 x 10 each Forward 8" step-up holding 15# on ipsilateral side with contralateral support 2 x 10 each Heel raises 2 x 20 SLS 3 x 30 sec each TRX squat 2 x 10 Knee extension machine DL 35# 2 x 15, SL 15# 2 x 10 each   [facility] Adult PT Treatment:                                                DATE: 07/11/22 Therapeutic Exercise: NuStep L8 x 5 min with LE while taking subjective Forward 8" step-up x 15 right Lateral 4" heel tap 2 x 10 Forward 4" heel tap x 10 right Leg press (cybex): DL 120# 2 x 10, SL 40# 2 x 10 right Rear foot elevated split squat 2 x 10 right Seated hamstring stretch foot in chair 3 x 30 sec right Slant board calf stretch 3 x 30 sec Standing TKE with black 2 x 10 right Sidelying hip abduction 2 x 15 right   PATIENT EDUCATION:  Education details: HEP Person educated: Patient Education method: Consulting civil engineer, demo, cues, handout Education comprehension: Verbalized understanding, returned demo, cues    HOME EXERCISE PROGRAM: Access Code: [email or web address]     ASSESSMENT: CLINICAL IMPRESSION: Patient tolerated therapy well with no adverse effects. Continued emphasis on progressing knee strengthening and stability. He demonstrates improved SL stability on the RLE requiring intermittent minimal use of UE support during SL strengthening. Began balance training  on unstable surface with patient having difficulty maintaining balance during SL  activity on the RLE requiring occasional use of reaching strategy. No reports of knee pain throughout session.      OBJECTIVE IMPAIRMENTS Abnormal gait, decreased activity tolerance, decreased balance, difficulty walking, decreased ROM, decreased strength, increased edema, impaired flexibility, and pain.    ACTIVITY LIMITATIONS lifting, bending, sitting, standing, squatting, stairs, transfers, toileting, dressing, and locomotion level   PARTICIPATION LIMITATIONS: meal prep, cleaning, driving, shopping, community activity, and yard work   [place], Past/current experiences, and Time since onset of injury/illness/exacerbation are also affecting patient's functional outcome.      GOALS: Goals reviewed with patient? Yes   SHORT TERM GOALS: Target date: 06/06/2022    Patient will be I with initial HEP in order to progress with therapy. Baseline: HEP provided at eval 06/06/2022: Goal status: MET   2.  PT will review FOTO with patient by 3rd visit in order to understand expected progress and outcome with therapy. Baseline: FOTO assessed at eval 06/06/2022: reviewed and reassessed Goal status: MET   3.  Patient will demonstrate right knee AROM 3-115 deg in order to improve gait and functional mobility. Baseline: right knee AROM 10-100 deg 06/06/2022: AROM 7-112 deg 06/27/2022: AROM 6 - 115 deg Goal status: PARTIALLY MET   4.  Patient will report right knee pain with activity </= 3/10 in order to reduce functional limitations Baseline: right knee pain 5/10 with activity 06/06/2022: patient denies any pain and minimal pain with activity Goal status: MET   LONG TERM GOALS: Target date: 08/08/2022   Patient will be I with final HEP to maintain progress from PT. Baseline: HEP provided at eval 06/27/2022: progressing Goal status: ONGOING   2.  Patient will report >/= 60% status on FOTO to indicate improved functional ability. Baseline: 41% functional status 06/06/2022:  62% Goal status: MET   3.  Patient will demonstrate right knee AROM 0-120 deg in order to normalize gait and transfers without limitation Baseline:  right knee AROM 10-100 deg 06/27/2022: AROM 6 - 115 deg Goal status: PARTIALLY MET   4.  Patient will demonstrate right knee strength grossly = 5/5 MMT in order to improve walking and stair negotiation Baseline: right knee strength limitation (see above) 06/27/2022: right knee strength limitation (see above) Goal status: PARTIALLY MET   5.  Patient will ambulate community level distances with LRAD in order to improve community access [place] ambulating household distances with RW 06/27/2022: ambulating with SPC Goal status: ONGOING   6.  Patient will report right knee pain with activity </= 1/10 in order to reduce functional limitations Baseline: right knee pain 5/10 with activity 06/27/2022: patient does pain with activity Goal status: ONGOING     PLAN: PT FREQUENCY: 1x/week   PT DURATION: 6 weeks   PLANNED INTERVENTIONS: Therapeutic exercises, Therapeutic activity, Neuromuscular re-education, Balance training, Gait training, Patient/Family education, Self Care, Joint mobilization, Joint manipulation, Stair training, Aquatic Therapy, Dry Needling, Cryotherapy, Moist heat, Taping, Vasopneumatic device, Manual therapy, and Re-evaluation   PLAN FOR NEXT SESSION:  review HEP and progress PRN, manual/stretching to improve right knee mobility, strength and balance progression.   Gwendolyn Grant, PT, DPT, ATC 07/25/22 10:57 AM

## 2022-07-27 ENCOUNTER — Telehealth: Payer: Self-pay | Admitting: Gastroenterology

## 2022-07-27 NOTE — Telephone Encounter (Signed)
Patient needs to cancel colonoscopy. Requesting a call back to reschedule.

## 2022-07-28 NOTE — Telephone Encounter (Signed)
Patients colonoscopy has been rescheduled from 07/31/22 with Dr Marius Ditch to 08/07/22 with Dr. Vicente Males.  Trish in Endo notified of date change.  Thanks,  [place], [place]

## 2022-08-01 ENCOUNTER — Telehealth: Payer: Self-pay | Admitting: Family Medicine

## 2022-08-01 ENCOUNTER — Ambulatory Visit: Payer: BC Managed Care – PPO

## 2022-08-01 DIAGNOSIS — M25561 Pain in right knee: Secondary | ICD-10-CM | POA: Diagnosis not present

## 2022-08-01 DIAGNOSIS — G8929 Other chronic pain: Secondary | ICD-10-CM

## 2022-08-01 DIAGNOSIS — M25661 Stiffness of right knee, not elsewhere classified: Secondary | ICD-10-CM

## 2022-08-01 DIAGNOSIS — R2689 Other abnormalities of gait and mobility: Secondary | ICD-10-CM

## 2022-08-01 DIAGNOSIS — M6281 Muscle weakness (generalized): Secondary | ICD-10-CM

## 2022-08-01 DIAGNOSIS — R600 Localized edema: Secondary | ICD-10-CM

## 2022-08-01 NOTE — Telephone Encounter (Signed)
-----   Message from [name], MD sent at 08/01/2022 10:56 AM EST ----- Sorry for the delay  I reviewed the CT he does not clearly describe the nature of the cyst in the liver.  If you are to get the benefit of doubt to the patient I would recommend an MRI liver mass protocol to ensure it is benign.  [name] ----- Message ----- From: [name], MD Sent: 07/06/2022  11:40 AM EST To: [name], MD  Hi Dr Vicente Males,   This patient had a recent CTA chest that incidentally picked up on liver lesion that was deemed a possible cyst. I have included the radiology read of the upper abdomen below. I was hoping you might provide some guidance on whether or not this lesion needs additional imaging (MRI or another CT) or follow-up. Thanks for you help.   Johnny Bradley  Upper Abdomen: There is fatty infiltration in liver. There is 1.8 cm fluid density lesion in the anterior aspect of liver, possibly cyst.

## 2022-08-01 NOTE — Therapy (Signed)
OUTPATIENT PHYSICAL THERAPY TREATMENT NOTE   Patient Name: Johnny Bradley MRN: [id number] DOB:[birth year], 61 y.o., male Today's Date: 08/01/2022  PCP: [name], MD   REFERRING PROVIDER: Aundra Dubin, PA-C   END OF SESSION:   PT End of Session - 08/01/22 1019     Visit Number 20    Number of Visits 21    Date for PT Re-Evaluation 08/08/22    Authorization Type [employer] / [employer]    PT Start Time 1019    PT Stop Time 1059    PT Time Calculation (min) 40 min    Activity Tolerance Patient tolerated treatment well    Behavior During Therapy WFL for tasks assessed/performed                        Past Medical History:  Diagnosis Date   Arthritis    Back pain    Hyperlipidemia    Hypertension    Shoulder pain    Left   Sleep apnea    Past Surgical History:  Procedure Laterality Date   KNEE ARTHROSCOPY Right    [facility]   PILONIDAL CYST EXCISION  1980   TOTAL KNEE ARTHROPLASTY Right 04/17/2022   Procedure: RIGHT TOTAL KNEE ARTHROPLASTY;  Surgeon: Leandrew Koyanagi, MD;  Location: [facility];  Service: Orthopedics;  Laterality: Right;   WISDOM TOOTH EXTRACTION     Patient Active Problem List   Diagnosis Date Noted   Chest pain on breathing 07/13/2022   Chest pain 07/06/2022   Liver lesion 07/06/2022   Status post total left knee replacement 04/17/2022   Primary osteoarthritis of right knee 11/15/2021   Right shoulder pain 11/01/2021   Abdominal discomfort 11/01/2021   Bilateral knee pain 09/15/2020   OSA (obstructive sleep apnea) 02/03/2020   Chronic knee pain 02/03/2020   Family history of angiosarcoma 04/15/2019   Prediabetes 04/14/2019   HLD (hyperlipidemia) 10/02/2018   Subcutaneous nodule of right hand 10/02/2018   Skin lesion 05/29/2018   Hypertension 12/03/2017   Obesity 05/25/2017   Right low back pain 12/19/2014    REFERRING DIAG: Hx of total knee replacement, right  THERAPY DIAG:  Chronic pain of right knee  Stiffness of right  knee, not elsewhere classified  Muscle weakness (generalized)  Other abnormalities of gait and mobility  Localized edema  Rationale for Evaluation and Treatment Rehabilitation  PERTINENT HISTORY: None  PRECAUTIONS: None   SUBJECTIVE: "It's a little stiff this morning."   PAIN:  Are you having pain? No  PATIENT GOALS: Progress to walking without device   OBJECTIVE: (objective measures completed at initial evaluation unless otherwise dated) PATIENT SURVEYS:  FOTO 41% functional status  06/06/2022: 62% - met goal   EDEMA:  Circumferential: Not formally assessed. Patient does exhibit right knee and lower leg swelling compared to left   MUSCLE LENGTH: Limitation in calf, hamstring, and quad flexibility   LOWER EXTREMITY ROM:   Active ROM Right eval Right  05/29/22 Right 06/06/2022 Right 06/16/22 Right 06/27/2022 Right  Knee flexion 100  108 112 112 115  Knee extension 10 lacking 7 lacking Lacking 7 Lacking 7 Lacking 6 Lacking 6    LOWER EXTREMITY MMT:   MMT Right eval Left eval Right 06/27/2022 Right 07/11/2022 Right 07/18/2022  Hip flexion 4- - 4  4+  Hip extension 3 - 4-    Hip abduction 3 - 4-    Knee flexion 4 - 4 4+ 4+  Knee extension 4- -  4 4+ 4+    FUNCTIONAL TESTS:  Not assessed   GAIT: - 05/23/2022 Distance walked: 100 ft Assistive device utilized: SPC - lofstrand Level of assistance: Modified independence Comments: limitation in knee extension with stance, minimal flexion of knee with swing, flat foot strike, antalgic on right, heavy reliance on walker for support     TODAY'S TREATMENT: [facility] Adult PT Treatment:                                                DATE: 08/01/22 Therapeutic Exercise: Recumbent bike level 3 x 5 minutes  Forward lunge with single UE support 2 x 10  SL leg press 3 x 8 @ 50 lbs Lateral step ups 8 inch 2 x 10; RLE Resisted knee extension with eccentric lowering on RLE 3 x 10 @ 30 lbs  Kettlebell squats 2 x 10; 10 lbs   Verbal update to HEP to include utilizing dumbbells for closed chain strengthening.    [facility] Adult PT Treatment:                                                DATE: 07/25/22 Therapeutic Exercise: Bike level 3 x 5 minutes  Cybex hip flexion 3 x 10 @ 50 lbs  Forward step ups RLE leading 1 x 10 step up; 2 x 10 knee driver; 8 inch step  Lateral band walks blue band at shins in // bars 3 sets d/b Bodyweight squats 2 x 10  Resisted knee extension RLE 3 x 10 @ 20 lbs  Updated HEP   Neuromuscular re-ed: Bosu balance 3 x 30 sec  Marching on airex 2 x 10  Tandem on airex 2 x 30 sec each  SLS 30 sec LLE; 3 sec RLE   [facility] Adult PT Treatment:                                                DATE: 07/18/22 Therapeutic Exercise: Recumbent bike L3 x 5 min while taking subjective Leg press (cybex): DL 140# 3 x 10, SL 40# 2 x 10 each Hip abduction machine 25# 2 x 10 each Forward 8" step-up holding 15# on ipsilateral side with contralateral support 2 x 10 each Heel raises 2 x 20 SLS 3 x 30 sec each TRX squat 2 x 10 Knee extension machine DL 35# 2 x 15, SL 15# 2 x 10 each    PATIENT EDUCATION:  Education details: HEP Person educated: Patient Education method: Explanation Education comprehension: Verbalized understanding   HOME EXERCISE PROGRAM: Access Code: [email or web address]     ASSESSMENT: CLINICAL IMPRESSION: Patient tolerated therapy well with no adverse effects. Focused on progression of CKC strengthening. He continues to demonstrate strength deficits on the RLE with closed chain tasks. He has difficulty allowing for fluid motion when returning from forward lunge when RLE is in rear requiring significant use of UE support to return to upright positioning. Patient was instructed that he can begin to utilize his dumbbells at home with closed chain exercises to further progress his strength independently with patient verbalizing understanding.  OBJECTIVE IMPAIRMENTS Abnormal gait, decreased  activity tolerance, decreased balance, difficulty walking, decreased ROM, decreased strength, increased edema, impaired flexibility, and pain.    ACTIVITY LIMITATIONS lifting, bending, sitting, standing, squatting, stairs, transfers, toileting, dressing, and locomotion level   PARTICIPATION LIMITATIONS: meal prep, cleaning, driving, shopping, community activity, and yard work   [place], Past/current experiences, and Time since onset of injury/illness/exacerbation are also affecting patient's functional outcome.      GOALS: Goals reviewed with patient? Yes   SHORT TERM GOALS: Target date: 06/06/2022    Patient will be I with initial HEP in order to progress with therapy. Baseline: HEP provided at eval 06/06/2022: Goal status: MET   2.  PT will review FOTO with patient by 3rd visit in order to understand expected progress and outcome with therapy. Baseline: FOTO assessed at eval 06/06/2022: reviewed and reassessed Goal status: MET   3.  Patient will demonstrate right knee AROM 3-115 deg in order to improve gait and functional mobility. Baseline: right knee AROM 10-100 deg 06/06/2022: AROM 7-112 deg 06/27/2022: AROM 6 - 115 deg Goal status: PARTIALLY MET   4.  Patient will report right knee pain with activity </= 3/10 in order to reduce functional limitations Baseline: right knee pain 5/10 with activity 06/06/2022: patient denies any pain and minimal pain with activity Goal status: MET   LONG TERM GOALS: Target date: 08/08/2022   Patient will be I with final HEP to maintain progress from PT. Baseline: HEP provided at eval 06/27/2022: progressing Goal status: ONGOING   2.  Patient will report >/= 60% status on FOTO to indicate improved functional ability. Baseline: 41% functional status 06/06/2022: 62% Goal status: MET   3.  Patient will demonstrate right knee AROM 0-120 deg in order to normalize gait and transfers without limitation Baseline:  right knee  AROM 10-100 deg 06/27/2022: AROM 6 - 115 deg Goal status: PARTIALLY MET   4.  Patient will demonstrate right knee strength grossly = 5/5 MMT in order to improve walking and stair negotiation Baseline: right knee strength limitation (see above) 06/27/2022: right knee strength limitation (see above) Goal status: PARTIALLY MET   5.  Patient will ambulate community level distances with LRAD in order to improve community access [place] ambulating household distances with RW 06/27/2022: ambulating with SPC Goal status: ONGOING   6.  Patient will report right knee pain with activity </= 1/10 in order to reduce functional limitations Baseline: right knee pain 5/10 with activity 06/27/2022: patient does pain with activity Goal status: ONGOING     PLAN: PT FREQUENCY: 1x/week   PT DURATION: 6 weeks   PLANNED INTERVENTIONS: Therapeutic exercises, Therapeutic activity, Neuromuscular re-education, Balance training, Gait training, Patient/Family education, Self Care, Joint mobilization, Joint manipulation, Stair training, Aquatic Therapy, Dry Needling, Cryotherapy, Moist heat, Taping, Vasopneumatic device, Manual therapy, and Re-evaluation   PLAN FOR NEXT SESSION:  review HEP and progress PRN, manual/stretching to improve right knee mobility, strength and balance progression.   Gwendolyn Grant, PT, DPT, ATC 08/01/22 1:33 PM

## 2022-08-01 NOTE — Telephone Encounter (Signed)
I called the patient and informed him that GI recommends a MRI and he stated he wants the referral placed.  I informed the patient that they would call him to schedule.  Lashina Milles,cma a

## 2022-08-01 NOTE — Telephone Encounter (Signed)
Please let the patient know that I just heard back from GI on the possible liver cyst that was on the CT scan.  They noted that patient should have MRI of his liver to ensure that the lesion is benign.  I can place this order if the patient is willing to have this done.

## 2022-08-03 ENCOUNTER — Other Ambulatory Visit: Payer: Self-pay | Admitting: Family

## 2022-08-03 DIAGNOSIS — R1900 Intra-abdominal and pelvic swelling, mass and lump, unspecified site: Secondary | ICD-10-CM

## 2022-08-03 DIAGNOSIS — R160 Hepatomegaly, not elsewhere classified: Secondary | ICD-10-CM

## 2022-08-04 ENCOUNTER — Encounter (HOSPITAL_BASED_OUTPATIENT_CLINIC_OR_DEPARTMENT_OTHER): Payer: Self-pay | Admitting: Cardiology

## 2022-08-04 ENCOUNTER — Ambulatory Visit (INDEPENDENT_AMBULATORY_CARE_PROVIDER_SITE_OTHER): Payer: BC Managed Care – PPO | Admitting: Cardiology

## 2022-08-04 VITALS — BP 116/90 | HR 78 | Ht 70.0 in | Wt 273.8 lb

## 2022-08-04 DIAGNOSIS — R079 Chest pain, unspecified: Secondary | ICD-10-CM | POA: Diagnosis not present

## 2022-08-04 DIAGNOSIS — Z7189 Other specified counseling: Secondary | ICD-10-CM | POA: Diagnosis not present

## 2022-08-04 DIAGNOSIS — E782 Mixed hyperlipidemia: Secondary | ICD-10-CM

## 2022-08-04 DIAGNOSIS — I2510 Atherosclerotic heart disease of native coronary artery without angina pectoris: Secondary | ICD-10-CM

## 2022-08-04 MED ORDER — METOPROLOL TARTRATE 50 MG PO TABS: ORAL_TABLET | ORAL | 0 refills | Status: DC

## 2022-08-04 NOTE — Patient Instructions (Addendum)
Medication Instructions:  TAKE METOPROLOL 50 MG TWO HOURS PRIOR TO CT   *If you need a refill on your cardiac medications before your next appointment, please call your pharmacy*  Lab Work: BMET 1 WEEK PRIOR TO CT  If you have labs (blood work) drawn today and your tests are completely normal, you will receive your results only by: [place] (if you have MyChart) OR A paper copy in the mail If you have any lab test that is abnormal or we need to change your treatment, we will call you to review the results.  Testing/Procedures: Your physician has requested that you have cardiac CT. Cardiac computed tomography (CT) is a painless test that uses an x-ray machine to take clear, detailed pictures of your heart. For further information please visit [email or web address]. Please follow instruction sheet as given.  Follow-Up: At [facility], you and your health needs are our priority.  As part of our continuing mission to provide you with exceptional heart care, we have created designated Provider Care Teams.  These Care Teams include your primary Cardiologist (physician) and Advanced Practice Providers (APPs -  Physician Assistants and Nurse Practitioners) who all work together to provide you with the care you need, when you need it.  We recommend signing up for the patient portal called "MyChart".  Sign up information is provided on this After Visit Summary.  MyChart is used to connect with patients for Virtual Visits (Telemedicine).  Patients are able to view lab/test results, encounter notes, upcoming appointments, etc.  Non-urgent messages can be sent to your provider as well.   To learn more about what you can do with MyChart, go to [email or web address].    Your next appointment:   6 week(s)  The format for your next appointment:   In Person  Provider:   Buford Dresser, MD    Other Instructions    Your cardiac CT will be scheduled at one of the below  locations:   [facility] [address] [place], [place] [address] [phone]  [place] [address] [place], [place] [address] [phone]  [facility] [place], [place] [address] [phone]  If scheduled at [facility], please arrive at the [facility] Entrance (Entrance C2) of [facility] 30 minutes prior to test start time. You can use the FREE valet parking offered at entrance C (encouraged to control the heart rate for the test)  Proceed to the [facility] Radiology Department (first floor) to check-in and test prep.  All radiology patients and guests should use entrance C2 at [facility], accessed from [facility], even though the hospital's physical address listed is [address]    If scheduled at [facility] or [facility], please arrive 15 mins early for check-in and test prep.   Please follow these instructions carefully (unless otherwise directed):  Hold all erectile dysfunction medications at least 3 days (72 hrs) prior to test. (Ie viagra, cialis, sildenafil, tadalafil, etc) We will administer nitroglycerin during this exam.   On the Night Before the Test: Be sure to Drink plenty of water. Do not consume any caffeinated/decaffeinated beverages or chocolate 12 hours prior to your test. Do not take any antihistamines 12 hours prior to your test.  On the Day of the Test: Drink plenty of water until 1 hour prior to the  test. Do not eat any food 1 hour prior to test. You may take your regular medications prior to the test.  Take metoprolol (Lopressor) two hours prior to test. HOLD Furosemide/Hydrochlorothiazide morning of the test.      After the Test: Drink plenty of water. After receiving IV contrast, you may experience a mild flushed  feeling. This is normal. On occasion, you may experience a mild rash up to 24 hours after the test. This is not dangerous. If this occurs, you can take Benadryl 25 mg and increase your fluid intake. If you experience trouble breathing, this can be serious. If it is severe call 911 IMMEDIATELY. If it is mild, please call our office. If you take any of these medications: Glipizide/Metformin, Avandament, Glucavance, please do not take 48 hours after completing test unless otherwise instructed.  We will call to schedule your test 2-4 weeks out understanding that some insurance companies will need an authorization prior to the service being performed.   For non-scheduling related questions, please contact the cardiac imaging nurse navigator should you have any questions/concerns: Marchia Bond, Cardiac Imaging Nurse Navigator Gordy Clement, Cardiac Imaging Nurse Navigator [facility] Direct Office Dial: [phone]   For scheduling needs, including cancellations and rescheduling, please call [place], [phone].   Cardiac CT Angiogram A cardiac CT angiogram is a procedure to look at the heart and the area around the heart. It may be done to help find the cause of chest pains or other symptoms of heart disease. During this procedure, a substance called contrast dye is injected into the blood vessels in the area to be checked. A large X-ray machine, called a CT scanner, then takes detailed pictures of the heart and the surrounding area. The procedure is also sometimes called a coronary CT angiogram, coronary artery scanning, or CTA. A cardiac CT angiogram allows the health care provider to see how well blood is flowing to and from the heart. The health care provider will be able to see if there are any problems, such as: Blockage or narrowing of the coronary arteries in the heart. Fluid around the heart. Signs of weakness or disease in the muscles, valves, and tissues of  the heart. Tell a health care provider about: Any allergies you have. This is especially important if you have had a previous allergic reaction to contrast dye. All medicines you are taking, including vitamins, herbs, eye drops, creams, and over-the-counter medicines. Any blood disorders you have. Any surgeries you have had. Any medical conditions you have. Whether you are pregnant or may be pregnant. Any anxiety disorders, chronic pain, or other conditions you have that may increase your stress or prevent you from lying still. What are the risks? Generally, this is a safe procedure. However, problems may occur, including: Bleeding. Infection. Allergic reactions to medicines or dyes. Damage to other structures or organs. Kidney damage from the contrast dye that is used. Increased risk of cancer from radiation exposure. This risk is low. Talk with your health care provider about: The risks and benefits of testing. How you can receive the lowest dose of radiation. What happens before the procedure? Wear comfortable clothing and remove any jewelry, glasses, dentures, and hearing aids. Follow instructions from your health care provider about eating and drinking. This may include: For 12 hours before the procedure -- avoid caffeine. This includes tea, coffee, soda, energy drinks, and diet pills. Drink plenty of water or other fluids that do not have caffeine in them.  Being well hydrated can prevent complications. For 4-6 hours before the procedure -- stop eating and drinking. The contrast dye can cause nausea, but this is less likely if your stomach is empty. Ask your health care provider about changing or stopping your regular medicines. This is especially important if you are taking diabetes medicines, blood thinners, or medicines to treat problems with erections (erectile dysfunction). What happens during the procedure?  Hair on your chest may need to be removed so that small sticky patches  called electrodes can be placed on your chest. These will transmit information that helps to monitor your heart during the procedure. An IV will be inserted into one of your veins. You might be given a medicine to control your heart rate during the procedure. This will help to ensure that good images are obtained. You will be asked to lie on an exam table. This table will slide in and out of the CT machine during the procedure. Contrast dye will be injected into the IV. You might feel warm, or you may get a metallic taste in your mouth. You will be given a medicine called nitroglycerin. This will relax or dilate the arteries in your heart. The table that you are lying on will move into the CT machine tunnel for the scan. The person running the machine will give you instructions while the scans are being done. You may be asked to: Keep your arms above your head. Hold your breath. Stay very still, even if the table is moving. When the scanning is complete, you will be moved out of the machine. The IV will be removed. The procedure may vary among health care providers and hospitals. What can I expect after the procedure? After your procedure, it is common to have: A metallic taste in your mouth from the contrast dye. A feeling of warmth. A headache from the nitroglycerin. Follow these instructions at home: Take over-the-counter and prescription medicines only as told by your health care provider. If you are told, drink enough fluid to keep your urine pale yellow. This will help to flush the contrast dye out of your body. Most people can return to their normal activities right after the procedure. Ask your health care provider what activities are safe for you. It is up to you to get the results of your procedure. Ask your health care provider, or the department that is doing the procedure, when your results will be ready. Keep all follow-up visits as told by your health care provider. This is  important. Contact a health care provider if: You have any symptoms of allergy to the contrast dye. These include: Shortness of breath. Rash or hives. A racing heartbeat. Summary A cardiac CT angiogram is a procedure to look at the heart and the area around the heart. It may be done to help find the cause of chest pains or other symptoms of heart disease. During this procedure, a large X-ray machine, called a CT scanner, takes detailed pictures of the heart and the surrounding area after a contrast dye has been injected into blood vessels in the area. Ask your health care provider about changing or stopping your regular medicines before the procedure. This is especially important if you are taking diabetes medicines, blood thinners, or medicines to treat erectile dysfunction. If you are told, drink enough fluid to keep your urine pale yellow. This will help to flush the contrast dye out of your body. This information is not intended to replace advice given  to you by your health care provider. Make sure you discuss any questions you have with your health care provider. Document Revised: 11/24/2021 Document Reviewed: 04/02/2019 Elsevier Patient Education  [place].

## 2022-08-04 NOTE — Progress Notes (Signed)
Cardiology Office Note:    Date:  08/04/2022   ID:  Johnny Bradley, DOB 01-Aug-1961, MRN [id number]  PCP:  [name], MD  Cardiologist:  None  Referring MD: [name], MD   CC:  New patient evaluation for chest pain.   History of Present Illness:    Johnny Bradley is a 61 y.o. male with a hx of hypertension, hyperlipidemia, arthritis, and sleep apnea, who is seen as a new consult at the request of [name], MD for the evaluation and management of chest pain.  He saw his PCP Dr. Caryl Bis 07/06/22 and reported intermittent chest pain over several days at rest. He had associated diaphoresis and tingling down his right arm. Per Dr. Ellen Henri notes, he has chronic shortness of breath that resolves with taking a deep breath. He went to the ED though did not wait to be seen. His labs were reassuring. He followed up with Dr Volanda Napoleon and had a CTA chest that was negative for PE. There was a possible liver cyst. He denied any recurrence of his symptoms since the day after his ED visit. His work-up did not seem to indicate an acute cardiac cause though given his symptoms he was referred to cardiology for further evaluation.   Chest pain: -Initial onset: He states that he is experiencing minor chest pain that has been intermittent for a long time. The first time he had a very severe episode was while driving. He suddenly developed tingling in his RUE and became diaphoretic as well. He visited the ED for this episode.  -Frequency: His severe episode as above was an isolated event, it has not recurred although he continues to have more mild chest pains. -Associated symptoms: RUE tingling and diaphoresis with severe episode. None with mild episodes. -Prior treatment: Resolution with Tylenol. -Alcohol: no hard liquor or wine. Only beer on the weekends. He drinks 8 to 10 Budweiser 55 beers at a time. He does not drink hard liquor or wine. And he always is drinking either water or  diet/ diet caffeine free soda.  -Tobacco: Not a smoker. He has smoked no more than 12 cigars in his life.  -Exercise level: Before 2011, he was able to run multiple miles a day or be on the elliptical for 45 minutes with no issues. But now he gets shortness of breath quickly, especially when using stairs. He is not very active anymore. He had a total knee replacement in August. He wants to wait at least 3 more months before he does a stress test. -Cardiac ROS: no PND, no orthopnea, no syncope -Family history:  His father had pacemaker and passed away in his sleep at age 53 though nobody else in his family has had any heart issues.   Over 20 years ago, he had an intense episode of lightheadedness, but nothing to that same degree since.  However he does have occasional mild lightheadedness lasting 1-2 seconds.   He is experiencing swelling in his ankles and feet. He is bruising and bleeding more easily.   He denies any palpitations. No headaches, syncope, orthopnea, or PND.  Past Medical History:  Diagnosis Date   Arthritis    Back pain    Hyperlipidemia    Hypertension    Shoulder pain    Left   Sleep apnea     Past Surgical History:  Procedure Laterality Date   KNEE ARTHROSCOPY Right    MCL Torn   PILONIDAL CYST EXCISION  1980  TOTAL KNEE ARTHROPLASTY Right 04/17/2022   Procedure: RIGHT TOTAL KNEE ARTHROPLASTY;  Surgeon: Leandrew Koyanagi, MD;  Location: [facility];  Service: Orthopedics;  Laterality: Right;   WISDOM TOOTH EXTRACTION      Current Medications: Current Outpatient Medications on File Prior to Visit  Medication Sig   amLODipine (NORVASC) 5 MG tablet TAKE 1 TABLET BY MOUTH DAILY NEEDS APPOINTMENT   amoxicillin (AMOXIL) 500 MG capsule Take four pills one hour prior to dental work/colonoscopy   pravastatin (PRAVACHOL) 20 MG tablet TAKE 1 TABLET BY MOUTH ON MONDAY, WEDNESDAY, AND FRIDAY AS DIRECTED BY DOCTOR   No current facility-administered medications on file prior to  visit.     Allergies:   Patient has no known allergies.   Social History   Tobacco Use   Smoking status: Never   Smokeless tobacco: Never  Vaping Use   Vaping Use: Never used  Substance Use Topics   Alcohol use: Yes    Alcohol/week: 12.0 standard drinks of alcohol    Types: 12 Cans of beer per week    Comment: On weekends- [address]    Drug use: No    Family History: family history includes Heart disease in his father; Stroke in his paternal grandmother.  ROS:   Please see the history of present illness.  Additional pertinent ROS: Constitutional: Negative for chills, fever, night sweats, unintentional weight loss  HENT: Negative for ear pain and hearing loss.   Eyes: Negative for loss of vision and eye pain.  Respiratory: Negative for cough, sputum, wheezing. Positive for shortness of breath. Cardiovascular: See HPI. Gastrointestinal: Negative for abdominal pain, melena, and hematochezia.  Genitourinary: Negative for dysuria and hematuria.  Musculoskeletal: Negative for falls and myalgias.  Skin: Negative for itching and rash.  Neurological: Negative for focal weakness, focal sensory changes and loss of consciousness. Positive for lightheadedness.  Endo/Heme/Allergies: Does not bruise/bleed easily.     EKGs/Labs/Other Studies Reviewed:    The following studies were reviewed today:  CTA Chest  06/29/2022: IMPRESSION: There is no evidence of pulmonary artery embolism. There is no evidence of thoracic aortic dissection. Scattered coronary artery calcifications are seen.   There is no focal pulmonary consolidation. There is no pleural effusion or pneumothorax.   Fatty liver.  Possible 1.8 cm hepatic cyst.  ETT  05/13/2018: Blood pressure demonstrated a normal response to exercise. There was no ST segment deviation noted during stress. No T wave inversion was noted during stress.   Normal stress test with no evidence of ischemia. Good exercise capacity with an  exercise duration of 8 minutes and 53 seconds.  Echo  04/04/2018: Study Conclusions   - Left ventricle: The cavity size was normal. Wall thickness was    normal. Systolic function was normal. The estimated ejection    fraction was in the range of 50% to 55%.  - Aortic valve: Valve area (VTI): 2.39 cm^2. Valve area (Vmax):    2.78 cm^2. Valve area (Vmean): 2.65 cm^2.  - Mitral valve: There was mild regurgitation. Valve area by    continuity equation (using LVOT flow): 2.95 cm^2.    EKG:  EKG is personally reviewed.   08/04/2022:  NSR at 78 bpm, IVCD  Recent Labs: 04/11/2022: ALT 35 06/28/2022: BUN 13; Creatinine, Ser 0.91; Hemoglobin 15.2; Platelets 181; Potassium 4.0; Sodium 141   Recent Lipid Panel    Component Value Date/Time   CHOL 175 11/01/2021 1008   TRIG 175.0 (H) 11/01/2021 1008   HDL 41.60 11/01/2021 1008  CHOLHDL 4 11/01/2021 1008   VLDL 35.0 11/01/2021 1008   LDLCALC 98 11/01/2021 1008   LDLDIRECT 157.0 10/11/2020 1507    Physical Exam:    VS:  BP (!) 116/90 (BP Location: Right Arm, Patient Position: Sitting, Cuff Size: Large)   Pulse 78   Ht '5\' 10"'$  (1.778 m)   Wt 273 lb 12.8 oz (124.2 kg)   BMI 39.29 kg/m     Wt Readings from Last 3 Encounters:  08/07/22 261 lb 12.8 oz (118.8 kg)  08/04/22 273 lb 12.8 oz (124.2 kg)  07/06/22 267 lb 9.6 oz (121.4 kg)    GEN: Well nourished, well developed in no acute distress HEENT: Normal, moist mucous membranes NECK: No JVD CARDIAC: regular rhythm, normal S1 and S2, no rubs or gallops. No murmur. VASCULAR: Radial and DP pulses 2+ bilaterally. No carotid bruits RESPIRATORY:  Clear to auscultation without rales, wheezing or rhonchi  ABDOMEN: Soft, non-tender, non-distended MUSCULOSKELETAL:  Ambulates independently SKIN: Warm and dry, no edema NEUROLOGIC:  Alert and oriented x 3. No focal neuro deficits noted. PSYCHIATRIC:  Normal affect    ASSESSMENT:    1. Chest pain of uncertain etiology   2. Coronary artery  calcification seen on CT scan   3. Mixed hyperlipidemia   4. Cardiac risk counseling   5. Counseling on health promotion and disease prevention    PLAN:    Chest pain  Coronary calcification seen on CT Mixed hyperlipidemia -discussed treadmill stress, nuclear stress/lexiscan, and CT coronary angiography. Discussed pros and cons of each, including but not limited to false positive/false negative risk, radiation risk, and risk of IV contrast dye. Based on shared decision making, decision was made to pursue CT coronary angiography. -will give one time dose of metoprolol 2 hours prior to scheduled test -counseled on need to get BMET prior to test -counseled on use of sublingual nitroglycerin and its importance to a good test -reviewed red flag warning signs that need immediate medical attention  -on pravastatin currently, would intensify if significant CAD noted on scan  Cardiac risk counseling and prevention recommendations: -recommend heart healthy/Mediterranean diet, with whole grains, fruits, vegetable, fish, lean meats, nuts, and olive oil. Limit salt. -recommend moderate walking, 3-5 times/week for 30-50 minutes each session. Aim for at least 150 minutes.week. Goal should be pace of 3 miles/hours, or walking 1.5 miles in 30 minutes -recommend avoidance of tobacco products. Avoid excess alcohol. -ASCVD risk score: The 10-year ASCVD risk score (Arnett DK, et al., 2019) is: 10.7%   Values used to calculate the score:     Age: 32 years     Sex: Male     Is Non-Hispanic African American: No     Diabetic: No     Tobacco smoker: No     Systolic Blood Pressure: 627 mmHg     Is BP treated: Yes     HDL Cholesterol: 41.6 mg/dL     Total Cholesterol: 175 mg/dL    Plan for follow up:  6 weeks or sooner as needed.  Buford Dresser, MD, PhD, [facility]    Medication Adjustments/Labs and Tests Ordered: Current medicines are reviewed at length with the patient  today.  Concerns regarding medicines are outlined above.   Orders Placed This Encounter  Procedures   CT CORONARY MORPH W/CTA COR W/SCORE W/CA W/CM &/OR WO/CM   Basic metabolic panel   EKG [id number]   Meds ordered this encounter  Medications   metoprolol tartrate (LOPRESSOR) 50  MG tablet    Sig: TAKE 1 TABLET 2 HOURS PRIOR TO CT    Dispense:  1 tablet    Refill:  0   Patient Instructions  Medication Instructions:  TAKE METOPROLOL 50 MG TWO HOURS PRIOR TO CT   *If you need a refill on your cardiac medications before your next appointment, please call your pharmacy*  Lab Work: BMET 1 WEEK PRIOR TO CT  If you have labs (blood work) drawn today and your tests are completely normal, you will receive your results only by: [place] (if you have MyChart) OR A paper copy in the mail If you have any lab test that is abnormal or we need to change your treatment, we will call you to review the results.  Testing/Procedures: Your physician has requested that you have cardiac CT. Cardiac computed tomography (CT) is a painless test that uses an x-ray machine to take clear, detailed pictures of your heart. For further information please visit [email or web address]. Please follow instruction sheet as given.  Follow-Up: At [facility], you and your health needs are our priority.  As part of our continuing mission to provide you with exceptional heart care, we have created designated Provider Care Teams.  These Care Teams include your primary Cardiologist (physician) and Advanced Practice Providers (APPs -  Physician Assistants and Nurse Practitioners) who all work together to provide you with the care you need, when you need it.  We recommend signing up for the patient portal called "MyChart".  Sign up information is provided on this After Visit Summary.  MyChart is used to connect with patients for Virtual Visits (Telemedicine).  Patients are able to view lab/test results, encounter  notes, upcoming appointments, etc.  Non-urgent messages can be sent to your provider as well.   To learn more about what you can do with MyChart, go to [email or web address].    Your next appointment:   6 week(s)  The format for your next appointment:   In Person  Provider:   Buford Dresser, MD    Other Instructions    Your cardiac CT will be scheduled at one of the below locations:   [facility] [address] [place], [place] [address] [phone]  [facility] [address] [place], [place] [address] [phone]  [facility] [place], [place] [address] [phone]  If scheduled at [facility], please arrive at the [facility] and Children[facility] Entrance (Entrance C2) of [facility] 30 minutes prior to test start time. You can use the FREE valet parking offered at entrance C (encouraged to control the heart rate for the test)  Proceed to the [facility] Radiology Department (first floor) to check-in and test prep.  All radiology patients and guests should use entrance C2 at [facility], accessed from [facility], even though the hospital's physical address listed is [address]    If scheduled at [facility] or [facility], please arrive 15 mins early for check-in and test prep.   Please follow these instructions carefully (unless otherwise directed):  Hold all erectile dysfunction medications at least 3 days (72 hrs) prior to test. (Ie viagra, cialis, sildenafil, tadalafil, etc) We will administer nitroglycerin during this exam.   On the Night Before the Test: Be sure to Drink plenty of water. Do not consume any caffeinated/decaffeinated beverages or chocolate 12 hours  prior to your test. Do not take any antihistamines 12 hours prior to your test.  On the  Day of the Test: Drink plenty of water until 1 hour prior to the test. Do not eat any food 1 hour prior to test. You may take your regular medications prior to the test.  Take metoprolol (Lopressor) two hours prior to test. HOLD Furosemide/Hydrochlorothiazide morning of the test.      After the Test: Drink plenty of water. After receiving IV contrast, you may experience a mild flushed feeling. This is normal. On occasion, you may experience a mild rash up to 24 hours after the test. This is not dangerous. If this occurs, you can take Benadryl 25 mg and increase your fluid intake. If you experience trouble breathing, this can be serious. If it is severe call 911 IMMEDIATELY. If it is mild, please call our office. If you take any of these medications: Glipizide/Metformin, Avandament, Glucavance, please do not take 48 hours after completing test unless otherwise instructed.  We will call to schedule your test 2-4 weeks out understanding that some insurance companies will need an authorization prior to the service being performed.   For non-scheduling related questions, please contact the cardiac imaging nurse navigator should you have any questions/concerns: Marchia Bond, Cardiac Imaging Nurse Navigator Gordy Clement, Cardiac Imaging Nurse Navigator [facility] Direct Office Dial: [phone]   For scheduling needs, including cancellations and rescheduling, please call [place], [phone].   Cardiac CT Angiogram A cardiac CT angiogram is a procedure to look at the heart and the area around the heart. It may be done to help find the cause of chest pains or other symptoms of heart disease. During this procedure, a substance called contrast dye is injected into the blood vessels in the area to be checked. A large X-ray machine, called a CT scanner, then takes detailed pictures of the heart and the surrounding area. The procedure is also sometimes called a coronary  CT angiogram, coronary artery scanning, or CTA. A cardiac CT angiogram allows the health care provider to see how well blood is flowing to and from the heart. The health care provider will be able to see if there are any problems, such as: Blockage or narrowing of the coronary arteries in the heart. Fluid around the heart. Signs of weakness or disease in the muscles, valves, and tissues of the heart. Tell a health care provider about: Any allergies you have. This is especially important if you have had a previous allergic reaction to contrast dye. All medicines you are taking, including vitamins, herbs, eye drops, creams, and over-the-counter medicines. Any blood disorders you have. Any surgeries you have had. Any medical conditions you have. Whether you are pregnant or may be pregnant. Any anxiety disorders, chronic pain, or other conditions you have that may increase your stress or prevent you from lying still. What are the risks? Generally, this is a safe procedure. However, problems may occur, including: Bleeding. Infection. Allergic reactions to medicines or dyes. Damage to other structures or organs. Kidney damage from the contrast dye that is used. Increased risk of cancer from radiation exposure. This risk is low. Talk with your health care provider about: The risks and benefits of testing. How you can receive the lowest dose of radiation. What happens before the procedure? Wear comfortable clothing and remove any jewelry, glasses, dentures, and hearing aids. Follow instructions from your health care provider about eating and drinking. This may include: For  12 hours before the procedure -- avoid caffeine. This includes tea, coffee, soda, energy drinks, and diet pills. Drink plenty of water or other fluids that do not have caffeine in them. Being well hydrated can prevent complications. For 4-6 hours before the procedure -- stop eating and drinking. The contrast dye can cause  nausea, but this is less likely if your stomach is empty. Ask your health care provider about changing or stopping your regular medicines. This is especially important if you are taking diabetes medicines, blood thinners, or medicines to treat problems with erections (erectile dysfunction). What happens during the procedure?  Hair on your chest may need to be removed so that small sticky patches called electrodes can be placed on your chest. These will transmit information that helps to monitor your heart during the procedure. An IV will be inserted into one of your veins. You might be given a medicine to control your heart rate during the procedure. This will help to ensure that good images are obtained. You will be asked to lie on an exam table. This table will slide in and out of the CT machine during the procedure. Contrast dye will be injected into the IV. You might feel warm, or you may get a metallic taste in your mouth. You will be given a medicine called nitroglycerin. This will relax or dilate the arteries in your heart. The table that you are lying on will move into the CT machine tunnel for the scan. The person running the machine will give you instructions while the scans are being done. You may be asked to: Keep your arms above your head. Hold your breath. Stay very still, even if the table is moving. When the scanning is complete, you will be moved out of the machine. The IV will be removed. The procedure may vary among health care providers and hospitals. What can I expect after the procedure? After your procedure, it is common to have: A metallic taste in your mouth from the contrast dye. A feeling of warmth. A headache from the nitroglycerin. Follow these instructions at home: Take over-the-counter and prescription medicines only as told by your health care provider. If you are told, drink enough fluid to keep your urine pale yellow. This will help to flush the contrast dye  out of your body. Most people can return to their normal activities right after the procedure. Ask your health care provider what activities are safe for you. It is up to you to get the results of your procedure. Ask your health care provider, or the department that is doing the procedure, when your results will be ready. Keep all follow-up visits as told by your health care provider. This is important. Contact a health care provider if: You have any symptoms of allergy to the contrast dye. These include: Shortness of breath. Rash or hives. A racing heartbeat. Summary A cardiac CT angiogram is a procedure to look at the heart and the area around the heart. It may be done to help find the cause of chest pains or other symptoms of heart disease. During this procedure, a large X-ray machine, called a CT scanner, takes detailed pictures of the heart and the surrounding area after a contrast dye has been injected into blood vessels in the area. Ask your health care provider about changing or stopping your regular medicines before the procedure. This is especially important if you are taking diabetes medicines, blood thinners, or medicines to treat erectile dysfunction. If you  are told, drink enough fluid to keep your urine pale yellow. This will help to flush the contrast dye out of your body. This information is not intended to replace advice given to you by your health care provider. Make sure you discuss any questions you have with your health care provider. Document Revised: 11/24/2021 Document Reviewed: 04/02/2019 Elsevier Patient Education  [place].     I,Mathew Stumpf,acting as a Education administrator for [employer], MD.,have documented all relevant documentation on the behalf of Buford Dresser, MD,as directed by  Buford Dresser, MD while in the presence of Buford Dresser, MD.  I, Buford Dresser, MD, have reviewed all documentation for this visit. The  documentation on 09/24/22 for the exam, diagnosis, procedures, and orders are all accurate and complete.   Signed, Buford Dresser, MD PhD 08/04/2022     [place]

## 2022-08-07 ENCOUNTER — Ambulatory Visit: Payer: BC Managed Care – PPO | Admitting: Anesthesiology

## 2022-08-07 ENCOUNTER — Encounter
Admission: RE | Disposition: A | Discharge status: Home / Self Care | Payer: Self-pay | Source: Ambulatory Visit | Attending: Gastroenterology

## 2022-08-07 ENCOUNTER — Encounter: Payer: Self-pay | Admitting: Gastroenterology

## 2022-08-07 ENCOUNTER — Ambulatory Visit
Admission: RE | Admit: 2022-08-07 | Discharge: 2022-08-07 | Disposition: A | Discharge status: Home / Self Care | Payer: BC Managed Care – PPO | Source: Ambulatory Visit | Attending: Gastroenterology | Admitting: Gastroenterology

## 2022-08-07 DIAGNOSIS — K5730 Diverticulosis of large intestine without perforation or abscess without bleeding: Secondary | ICD-10-CM | POA: Diagnosis not present

## 2022-08-07 DIAGNOSIS — D122 Benign neoplasm of ascending colon: Secondary | ICD-10-CM | POA: Diagnosis not present

## 2022-08-07 DIAGNOSIS — D126 Benign neoplasm of colon, unspecified: Secondary | ICD-10-CM

## 2022-08-07 DIAGNOSIS — G4730 Sleep apnea, unspecified: Secondary | ICD-10-CM | POA: Diagnosis not present

## 2022-08-07 DIAGNOSIS — Z1211 Encounter for screening for malignant neoplasm of colon: Secondary | ICD-10-CM

## 2022-08-07 DIAGNOSIS — Z79899 Other long term (current) drug therapy: Secondary | ICD-10-CM | POA: Insufficient documentation

## 2022-08-07 DIAGNOSIS — I10 Essential (primary) hypertension: Secondary | ICD-10-CM | POA: Insufficient documentation

## 2022-08-07 DIAGNOSIS — E785 Hyperlipidemia, unspecified: Secondary | ICD-10-CM | POA: Diagnosis not present

## 2022-08-07 HISTORY — PX: COLONOSCOPY WITH PROPOFOL: SHX5780

## 2022-08-07 HISTORY — DX: Benign neoplasm of colon, unspecified: D12.6

## 2022-08-07 SURGERY — COLONOSCOPY WITH PROPOFOL
Anesthesia: General

## 2022-08-07 MED ORDER — PROPOFOL 1000 MG/100ML IV EMUL
INTRAVENOUS | Status: AC
  Filled 2022-08-07: qty 100

## 2022-08-07 MED ORDER — PROPOFOL 500 MG/50ML IV EMUL
INTRAVENOUS | Status: DC | PRN
  Administered 2022-08-07: 150 ug/kg/min via INTRAVENOUS

## 2022-08-07 MED ORDER — LIDOCAINE 2% (20 MG/ML) 5 ML SYRINGE
INTRAMUSCULAR | Status: DC | PRN
  Administered 2022-08-07: 50 mg via INTRAVENOUS

## 2022-08-07 MED ORDER — PROPOFOL 10 MG/ML IV BOLUS
INTRAVENOUS | Status: DC | PRN
  Administered 2022-08-07: 100 mg via INTRAVENOUS

## 2022-08-07 MED ORDER — SODIUM CHLORIDE 0.9 % IV SOLN: INTRAVENOUS | Status: DC

## 2022-08-07 NOTE — H&P (Signed)
Johnny Bellows, MD [address] [place], [place], [place], [address] [address], [place], [place], [place], [address] Phone: [phone]  Fax: [phone]  Primary Care Physician:  [name], MD   Pre-Procedure History & Physical: HPI:  Johnny Bradley is a 62 y.o. male is here for an colonoscopy.   Past Medical History:  Diagnosis Date   Arthritis    Back pain    Hyperlipidemia    Hypertension    Shoulder pain    Left   Sleep apnea     Past Surgical History:  Procedure Laterality Date   JOINT REPLACEMENT     KNEE ARTHROSCOPY Right    MCL Torn   PILONIDAL CYST EXCISION  1980   TOTAL KNEE ARTHROPLASTY Right 04/17/2022   Procedure: RIGHT TOTAL KNEE ARTHROPLASTY;  Surgeon: Leandrew Koyanagi, MD;  Location: [facility];  Service: Orthopedics;  Laterality: Right;   WISDOM TOOTH EXTRACTION      Prior to Admission medications   Medication Sig Start Date End Date Taking? Authorizing Provider  amLODipine (NORVASC) 5 MG tablet TAKE 1 TABLET BY MOUTH DAILY NEEDS APPOINTMENT 05/25/22  Yes [name], MD  aspirin EC 81 MG tablet Take 81 mg by mouth daily. Swallow whole.   Yes [provider]  Omega-3 Fatty Acids (FISH OIL) 300 MG CAPS Take by mouth.   Yes [provider]  pravastatin (PRAVACHOL) 20 MG tablet TAKE 1 TABLET BY MOUTH ON MONDAY, WEDNESDAY, AND FRIDAY AS DIRECTED BY DOCTOR 03/01/22  Yes [name], MD  amoxicillin (AMOXIL) 500 MG capsule Take four pills one hour prior to dental work/colonoscopy 07/25/22   Aundra Dubin, PA-C  metoprolol tartrate (LOPRESSOR) 50 MG tablet TAKE 1 TABLET 2 HOURS PRIOR TO CT 08/04/22   Buford Dresser, MD    Allergies as of 07/06/2022   (No Known Allergies)    Family History  Problem Relation Age of Onset   Heart disease Father    Stroke Paternal Grandmother     Social History   Socioeconomic History   Marital status: Married    Spouse name: Not on file   Number of children: Not  on file   Years of education: Not on file   Highest education level: Not on file  Occupational History   Not on file  Tobacco Use   Smoking status: Never   Smokeless tobacco: Never  Vaping Use   Vaping Use: Never used  Substance and Sexual Activity   Alcohol use: Yes    Alcohol/week: 12.0 standard drinks of alcohol    Types: 12 Cans of beer per week    Comment: On weekends- [address]    Drug use: No   Sexual activity: Yes    Partners: Female    Comment: Wife  Other Topics Concern   Not on file  Social History Narrative   Computer Dept. Next door- manages paperwork    Lives at home with wife and daughter & fiance    Son who owns Medical sales representative. And has a son 27 yo    Granddaughter 8 mos from daughter    Pets: 1 cat 2 dogs    Right handed    Caffeine- none   Masters degree    Enjoys- outdoor activities, festivals    Social Determinants of Radio broadcast assistant Strain: Not on file  Food Insecurity: Not on file  Transportation Needs: No Transportation Needs (08/04/2022)   [place] - Transportation  Lack of Transportation (Medical): No    Lack of Transportation (Non-Medical): No  Physical Activity: Inactive (08/04/2022)   Exercise Vital Sign    Days of Exercise per Week: 0 days    Minutes of Exercise per Session: 0 min  Stress: Not on file  Social Connections: Not on file  Intimate Partner Violence: Not on file    Review of Systems: See HPI, otherwise negative ROS  Physical Exam: BP 114/80   Pulse 67   Temp (!) 96.9 F (36.1 C) (Tympanic)   Resp 16   Wt 118.8 kg   SpO2 99%   BMI 37.56 kg/m  General:   Alert,  pleasant and cooperative in NAD Head:  Normocephalic and atraumatic. Neck:  Supple; no masses or thyromegaly. Lungs:  Clear throughout to auscultation, normal respiratory effort.    Heart:  +S1, +S2, Regular rate and rhythm, No edema. Abdomen:  Soft, nontender and nondistended. Normal bowel sounds, without guarding, and without rebound.    Neurologic:  Alert and  oriented x4;  grossly normal neurologically.  Impression/Plan: Johnny Bradley is here for an colonoscopy to be performed for Screening colonoscopy average risk   Risks, benefits, limitations, and alternatives regarding  colonoscopy have been reviewed with the patient.  Questions have been answered.  All parties agreeable.   Johnny Bellows, MD  08/07/2022, 10:57 AM

## 2022-08-07 NOTE — Transfer of Care (Signed)
Immediate Anesthesia Transfer of Care Note  Patient: Johnny Bradley  Procedure(s) Performed: COLONOSCOPY WITH PROPOFOL  Patient Location: Endoscopy Unit  Anesthesia Type:General  Level of Consciousness: awake, alert , and oriented  Airway & Oxygen Therapy: Patient Spontanous Breathing  Post-op Assessment: Report given to RN and Post -op Vital signs reviewed and stable  Post vital signs: Reviewed and stable  Last Vitals:  Vitals Value Taken Time  BP 111/60 08/07/22 0851  Temp 36.1 C 08/07/22 0849  Pulse 83 08/07/22 0849  Resp 17 08/07/22 0852  SpO2 96 % 08/07/22 0849  Vitals shown include unvalidated device data.  Last Pain:  Vitals:   08/07/22 0849  TempSrc: Tympanic  PainSc: Asleep         Complications: No notable events documented.

## 2022-08-07 NOTE — Therapy (Signed)
OUTPATIENT PHYSICAL THERAPY TREATMENT NOTE   Patient Name: Johnny Bradley MRN: [id number] DOB:1960/11/30, 61 y.o., male Today's Date: 08/08/2022  PCP: [name], MD   REFERRING PROVIDER: Aundra Dubin, PA-C   END OF SESSION:   PT End of Session - 08/08/22 1045     Visit Number 21    Number of Visits 25    Date for PT Re-Evaluation 09/05/22    Authorization Type [employer] / [employer]    PT Start Time 1015    PT Stop Time 1100    PT Time Calculation (min) 45 min    Activity Tolerance Patient tolerated treatment well    Behavior During Therapy WFL for tasks assessed/performed                         Past Medical History:  Diagnosis Date   Arthritis    Back pain    Hyperlipidemia    Hypertension    Shoulder pain    Left   Sleep apnea    Past Surgical History:  Procedure Laterality Date   JOINT REPLACEMENT     KNEE ARTHROSCOPY Right    MCL Torn   PILONIDAL CYST EXCISION  1980   TOTAL KNEE ARTHROPLASTY Right 04/17/2022   Procedure: RIGHT TOTAL KNEE ARTHROPLASTY;  Surgeon: Leandrew Koyanagi, MD;  Location: [facility];  Service: Orthopedics;  Laterality: Right;   WISDOM TOOTH EXTRACTION     Patient Active Problem List   Diagnosis Date Noted   Adenomatous polyp of colon 08/07/2022   Chest pain on breathing 07/13/2022   Chest pain 07/06/2022   Liver lesion 07/06/2022   Status post total left knee replacement 04/17/2022   Primary osteoarthritis of right knee 11/15/2021   Right shoulder pain 11/01/2021   Abdominal discomfort 11/01/2021   Bilateral knee pain 09/15/2020   OSA (obstructive sleep apnea) 02/03/2020   Chronic knee pain 02/03/2020   Family history of angiosarcoma 04/15/2019   Prediabetes 04/14/2019   HLD (hyperlipidemia) 10/02/2018   Subcutaneous nodule of right hand 10/02/2018   Skin lesion 05/29/2018   Hypertension 12/03/2017   Encounter for screening colonoscopy 05/25/2017   Obesity 05/25/2017   Right low back pain 12/19/2014     REFERRING DIAG: Hx of total knee replacement, right  THERAPY DIAG:  Chronic pain of right knee  Stiffness of right knee, not elsewhere classified  Muscle weakness (generalized)  Other abnormalities of gait and mobility  Localized edema  Rationale for Evaluation and Treatment Rehabilitation  PERTINENT HISTORY: None  PRECAUTIONS: None   SUBJECTIVE: Patient he is doing well, he has been walking around 2.5 miles around the neighborhood without any issues and denies any pain. He feels his walking has improved. He does still have some limitations with his knee motion and feels his right knee isn't quite as strong as the left knee.  PAIN:  Are you having pain? No  PATIENT GOALS: Progress to walking without device   OBJECTIVE: (objective measures completed at initial evaluation unless otherwise dated) PATIENT SURVEYS:  FOTO 41% functional status  06/06/2022: 62% - met goal   EDEMA:  Circumferential: Not formally assessed. Patient does exhibit right knee and lower leg swelling compared to left   MUSCLE LENGTH: Limitation in calf, hamstring, and quad flexibility   LOWER EXTREMITY ROM:   Active ROM Right eval Right  06/16/22 Right 06/27/2022 Right Right 08/08/2022  Knee flexion 100  112 115 115  Knee extension 10 lacking 7 lacking Lacking  6 Lacking 6 Lacking 4    LOWER EXTREMITY MMT:   MMT Right eval Left eval Right 06/27/2022 Right 07/11/2022 Right 07/18/2022 Right 08/08/2022  Hip flexion 4- - 4  4+   Hip extension 3 - 4-     Hip abduction 3 - 4-     Knee flexion 4 - 4 4+ 4+ 4+  Knee extension 4- - 4 4+ 4+ 4+    FUNCTIONAL TESTS:  Not assessed   GAIT: - 08/08/2022 Assistive device utilized: None Level of assistance: Independent Comments: limitation in knee extension with stance     TODAY'S TREATMENT: [facility] Adult PT Treatment:                                                DATE: 08/08/22 Therapeutic Exercise: Recumbent bike L4 x 5 minutes while  taking subjective Leg press (cybex): DL 140# 3 x 10; SL 60# 2 x 10 Forward 8" step-up with 10# bilaterally 2 x 10 each Heel raises 2 x 20 Lateral band walk with green at knees 3 x 20 down/back SLS on Airex 3 x 30 sec each Knee extension machine 35# 3 x 10 Knee flexion machine 35# 3 x 10   [facility] Adult PT Treatment:                                                DATE: 08/01/22 Therapeutic Exercise: Recumbent bike level 3 x 5 minutes  Forward lunge with single UE support 2 x 10  SL leg press 3 x 8 @ 50 lbs Lateral step ups 8 inch 2 x 10; RLE Resisted knee extension with eccentric lowering on RLE 3 x 10 @ 30 lbs  Kettlebell squats 2 x 10; 10 lbs  Verbal update to HEP to include utilizing dumbbells for closed chain strengthening.   [facility] Adult PT Treatment:                                                DATE: 07/25/22 Therapeutic Exercise: Bike level 3 x 5 minutes  Cybex hip flexion 3 x 10 @ 50 lbs  Forward step ups RLE leading 1 x 10 step up; 2 x 10 knee driver; 8 inch step  Lateral band walks blue band at shins in // bars 3 sets d/b Bodyweight squats 2 x 10  Resisted knee extension RLE 3 x 10 @ 20 lbs  Updated HEP  Neuromuscular re-ed: Bosu balance 3 x 30 sec  Marching on airex 2 x 10  Tandem on airex 2 x 30 sec each  SLS 30 sec LLE; 3 sec RLE   [facility] Adult PT Treatment:                                                DATE: 07/18/22 Therapeutic Exercise: Recumbent bike L3 x 5 min while taking subjective Leg press (cybex): DL 140# 3 x 10, SL 40# 2 x 10 each Hip abduction machine  25# 2 x 10 each Forward 8" step-up holding 15# on ipsilateral side with contralateral support 2 x 10 each Heel raises 2 x 20 SLS 3 x 30 sec each TRX squat 2 x 10 Knee extension machine DL 35# 2 x 15, SL 15# 2 x 10 each  PATIENT EDUCATION:  Education details: POC extension, HEP Person educated: Patient Education method: Explanation Education comprehension: Verbalized understanding   HOME EXERCISE  PROGRAM: Access Code: [email or web address]     ASSESSMENT: CLINICAL IMPRESSION: Patient tolerated therapy well with no adverse effects. He continues to progress well with therapy and demonstrates slight improvement in knee extension range of motion and is progressing nicely with his strengthening exercises. He is no longer using an assistive device with ambulation. He does exhibit continued limitation in overall knee motion and strength. Therapy continues to focus primarily on progressing his strength and stability with good tolerance. No changes were made to his HEP this visit. Patient would benefit from continued skilled PT to progress his mobility and strength to reduce pain and maximize functional ability, so will extend PT POC for 4 more weeks.      OBJECTIVE IMPAIRMENTS Abnormal gait, decreased activity tolerance, decreased balance, difficulty walking, decreased ROM, decreased strength, increased edema, impaired flexibility, and pain.    ACTIVITY LIMITATIONS lifting, bending, sitting, standing, squatting, stairs, transfers, toileting, dressing, and locomotion level   PARTICIPATION LIMITATIONS: meal prep, cleaning, driving, shopping, community activity, and yard work   [place], Past/current experiences, and Time since onset of injury/illness/exacerbation are also affecting patient's functional outcome.      GOALS: Goals reviewed with patient? Yes   SHORT TERM GOALS: Target date: 06/06/2022    Patient will be I with initial HEP in order to progress with therapy. Baseline: HEP provided at eval 06/06/2022: Goal status: MET   2.  PT will review FOTO with patient by 3rd visit in order to understand expected progress and outcome with therapy. Baseline: FOTO assessed at eval 06/06/2022: reviewed and reassessed Goal status: MET   3.  Patient will demonstrate right knee AROM 3-115 deg in order to improve gait and functional mobility. Baseline: right knee AROM 10-100  deg 06/06/2022: AROM 7-112 deg 06/27/2022: AROM 6 - 115 deg 08/08/2022: AROM 4 - 115 deg Goal status: PARTIALLY MET   4.  Patient will report right knee pain with activity </= 3/10 in order to reduce functional limitations Baseline: right knee pain 5/10 with activity 06/06/2022: patient denies any pain and minimal pain with activity Goal status: MET   LONG TERM GOALS: Target date: 09/05/2022   Patient will be I with final HEP to maintain progress from PT. Baseline: HEP provided at eval 06/27/2022: progressing 08/08/2022: progressing Goal status: ONGOING   2.  Patient will report >/= 60% status on FOTO to indicate improved functional ability. Baseline: 41% functional status 06/06/2022: 62% Goal status: MET   3.  Patient will demonstrate right knee AROM 0-120 deg in order to normalize gait and transfers without limitation Baseline:  right knee AROM 10-100 deg 06/27/2022: AROM 6 - 115 deg 08/08/2022: AROM 4 - 115 deg Goal status: PARTIALLY MET   4.  Patient will demonstrate right knee strength grossly = 5/5 MMT in order to improve walking and stair negotiation Baseline: right knee strength limitation (see above) 06/27/2022: right knee strength limitation (see above) 08/08/2022: grossly 4+/5 MMT Goal status: PARTIALLY MET   5.  Patient will ambulate community level distances with LRAD in order to improve community access Baseline:patient  ambulating household distances with RW 06/27/2022: ambulating with [facility] 08/08/2022: no assistive device Goal status: MET   6.  Patient will report right knee pain with activity </= 1/10 in order to reduce functional limitations Baseline: right knee pain 5/10 with activity 06/27/2022: patient does pain with activity 08/08/2022: patient denies pain with activity Goal status: MET     PLAN: PT FREQUENCY: 1x/week   PT DURATION: 4 weeks   PLANNED INTERVENTIONS: Therapeutic exercises, Therapeutic activity, Neuromuscular re-education, Balance  training, Gait training, Patient/Family education, Self Care, Joint mobilization, Joint manipulation, Stair training, Aquatic Therapy, Dry Needling, Cryotherapy, Moist heat, Taping, Vasopneumatic device, Manual therapy, and Re-evaluation   PLAN FOR NEXT SESSION:  review HEP and progress PRN, manual/stretching to improve right knee mobility, strength and balance progression.    Hilda Blades, PT, DPT, LAT, ATC 08/08/22  11:09 AM Phone: [phone] Fax: [phone]

## 2022-08-07 NOTE — Anesthesia Preprocedure Evaluation (Signed)
Anesthesia Evaluation  Patient identified by MRN, date of birth, ID band Patient awake    Reviewed: Allergy & Precautions, H&P , NPO status , Patient's Chart, lab work & pertinent test results, reviewed documented beta blocker date and time   History of Anesthesia Complications Negative for: history of anesthetic complications  Airway Mallampati: II  TM Distance: >3 FB Neck ROM: full    Dental  (+) Caps, Dental Advidsory Given, Implants, Teeth Intact   Pulmonary neg shortness of breath, sleep apnea , neg COPD, neg recent URI   Pulmonary exam normal breath sounds clear to auscultation       Cardiovascular Exercise Tolerance: Good hypertension, (-) angina (-) Past MI and (-) Cardiac Stents Normal cardiovascular exam(-) dysrhythmias (-) Valvular Problems/Murmurs Rhythm:regular Rate:Normal     Neuro/Psych negative neurological ROS  negative psych ROS   GI/Hepatic negative GI ROS, Neg liver ROS,,,  Endo/Other  negative endocrine ROS    Renal/GU negative Renal ROS  negative genitourinary   Musculoskeletal   Abdominal   Peds  Hematology negative hematology ROS (+)   Anesthesia Other Findings Past Medical History: No date: Arthritis No date: Back pain No date: Hyperlipidemia No date: Hypertension No date: Shoulder pain     Comment:  Left No date: Sleep apnea   Reproductive/Obstetrics negative OB ROS                             Anesthesia Physical Anesthesia Plan  ASA: 2  Anesthesia Plan: General   Post-op Pain Management:    Induction: Intravenous  PONV Risk Score and Plan: 2 and Propofol infusion and TIVA  Airway Management Planned: Natural Airway and Nasal Cannula  Additional Equipment:   Intra-op Plan:   Post-operative Plan:   Informed Consent: I have reviewed the patients History and Physical, chart, labs and discussed the procedure including the risks, benefits and  alternatives for the proposed anesthesia with the patient or authorized representative who has indicated his/her understanding and acceptance.     Dental Advisory Given  Plan Discussed with: Anesthesiologist, CRNA and Surgeon  Anesthesia Plan Comments:        Anesthesia Quick Evaluation

## 2022-08-07 NOTE — Op Note (Signed)
[facility] Gastroenterology Patient Name: Johnny Bradley Procedure Date: 08/07/2022 8:28 AM MRN: [id number] Account #: [id number] Date of Birth: 09-18-1960 Admit Type: Outpatient Age: 61 Room: [facility] ENDO ROOM 1 Gender: Male Note Status: Finalized Instrument Name: Jasper Riling [id number] Procedure:             Colonoscopy Indications:           Screening for colorectal malignant neoplasm Providers:             [name] MD, MD Referring MD:          Angela Adam. Caryl Bis (Referring MD) Medicines:             Monitored Anesthesia Care Complications:         No immediate complications. Procedure:             Pre-Anesthesia Assessment:                        - Prior to the procedure, a History and Physical was                         performed, and patient medications, allergies and                         sensitivities were reviewed. The patient's tolerance                         of previous anesthesia was reviewed.                        - The risks and benefits of the procedure and the                         sedation options and risks were discussed with the                         patient. All questions were answered and informed                         consent was obtained.                        - ASA Grade Assessment: II - A patient with mild                         systemic disease.                        After obtaining informed consent, the colonoscope was                         passed under direct vision. Throughout the procedure,                         the patient's blood pressure, pulse, and oxygen                         saturations were monitored continuously. The                         Colonoscope was introduced  through the anus and                         advanced to the the cecum, identified by the                         appendiceal orifice. The colonoscopy was performed                         with ease. The patient tolerated the procedure well.                          The quality of the bowel preparation was excellent.                         The ileocecal valve, appendiceal orifice, and rectum                         were photographed. Findings:      The perianal and digital rectal examinations were normal.      A 3 mm polyp was found in the ascending colon. The polyp was sessile.       The polyp was removed with a jumbo cold forceps. Resection and retrieval       were complete.      Two sessile polyps were found in the ascending colon. The polyps were 5       to 7 mm in size. These polyps were removed with a cold snare. Resection       and retrieval were complete.      Multiple small-mouthed diverticula were found in the left colon.      The exam was otherwise without abnormality on direct and retroflexion       views. Impression:            - One 3 mm polyp in the ascending colon, removed with                         a jumbo cold forceps. Resected and retrieved.                        - Two 5 to 7 mm polyps in the ascending colon, removed                         with a cold snare. Resected and retrieved.                        - Diverticulosis in the left colon.                        - The examination was otherwise normal on direct and                         retroflexion views. Recommendation:        - Discharge patient to home (with escort).                        - Resume previous diet.                        -  Continue present medications.                        - Await pathology results.                        - Repeat colonoscopy for surveillance based on                         pathology results. Procedure Code(s):     --- Professional ---                        [phone], Colonoscopy, flexible; with removal of                         tumor(s), polyp(s), or other lesion(s) by snare                         technique                        [id number], 59, Colonoscopy, flexible; with biopsy, single                         or  multiple Diagnosis Code(s):     --- Professional ---                        D12.2, Benign neoplasm of ascending colon                        Z12.11, Encounter for screening for malignant neoplasm                         of colon                        K57.30, Diverticulosis of large intestine without                         perforation or abscess without bleeding CPT copyright 2022 American Medical Association. All rights reserved. The codes documented in this report are preliminary and upon coder review may  be revised to meet current compliance requirements. [name], MD [name] MD, MD 08/07/2022 8:48:18 AM This report has been signed electronically. Number of Addenda: 0 Note Initiated On: 08/07/2022 8:28 AM Scope Withdrawal Time: 0 hours 11 minutes 5 seconds  Total Procedure Duration: 0 hours 13 minutes 4 seconds  Estimated Blood Loss:  Estimated blood loss: none.      [facility]

## 2022-08-07 NOTE — Anesthesia Postprocedure Evaluation (Signed)
Anesthesia Post Note  Patient: Johnny Bradley  Procedure(s) Performed: COLONOSCOPY WITH PROPOFOL  Patient location during evaluation: Endoscopy Anesthesia Type: General Level of consciousness: awake and alert Pain management: pain level controlled Vital Signs Assessment: post-procedure vital signs reviewed and stable Respiratory status: spontaneous breathing, nonlabored ventilation, respiratory function stable and patient connected to nasal cannula oxygen Cardiovascular status: blood pressure returned to baseline and stable Postop Assessment: no apparent nausea or vomiting Anesthetic complications: no   No notable events documented.   Last Vitals:  Vitals:   08/07/22 0859 08/07/22 0909  BP: 101/83 114/80  Pulse: 79 67  Resp: 19 16  Temp:    SpO2: 96% 99%    Last Pain:  Vitals:   08/07/22 0909  TempSrc:   PainSc: 0-No pain                 Martha Clan

## 2022-08-08 ENCOUNTER — Ambulatory Visit: Payer: BC Managed Care – PPO | Admitting: Physical Therapy

## 2022-08-08 ENCOUNTER — Other Ambulatory Visit: Payer: Self-pay

## 2022-08-08 ENCOUNTER — Encounter: Payer: Self-pay | Admitting: Physical Therapy

## 2022-08-08 DIAGNOSIS — M6281 Muscle weakness (generalized): Secondary | ICD-10-CM

## 2022-08-08 DIAGNOSIS — M25561 Pain in right knee: Secondary | ICD-10-CM | POA: Diagnosis not present

## 2022-08-08 DIAGNOSIS — M25661 Stiffness of right knee, not elsewhere classified: Secondary | ICD-10-CM

## 2022-08-08 DIAGNOSIS — G8929 Other chronic pain: Secondary | ICD-10-CM

## 2022-08-08 DIAGNOSIS — R2689 Other abnormalities of gait and mobility: Secondary | ICD-10-CM

## 2022-08-08 DIAGNOSIS — R600 Localized edema: Secondary | ICD-10-CM

## 2022-08-08 LAB — SURGICAL PATHOLOGY

## 2022-08-10 ENCOUNTER — Encounter: Payer: Self-pay | Admitting: Gastroenterology

## 2022-08-11 ENCOUNTER — Telehealth (HOSPITAL_COMMUNITY): Payer: Self-pay | Admitting: *Deleted

## 2022-08-11 NOTE — Telephone Encounter (Signed)
Reaching out to Johnny Bradley to offer assistance regarding upcoming cardiac imaging study; pt verbalizes understanding of appt date/time, parking situation and where to check in, pre-test NPO status and medications ordered, and verified current allergies; name and call back number provided for further questions should they arise  Johnny Clement RN Navigator Cardiac Imaging [facility] [phone] office [phone] cell  Johnny Bradley to hold his BP medication and take '50mg'$  metoprolol tartrate two hours prior to his cardiac CT scan.  He is aware to arrive at 12pm.

## 2022-08-15 ENCOUNTER — Ambulatory Visit (HOSPITAL_BASED_OUTPATIENT_CLINIC_OR_DEPARTMENT_OTHER)
Admission: RE | Admit: 2022-08-15 | Discharge: 2022-08-15 | Disposition: A | Discharge status: Home / Self Care | Payer: BC Managed Care – PPO | Source: Ambulatory Visit | Attending: Internal Medicine | Admitting: Internal Medicine

## 2022-08-15 ENCOUNTER — Other Ambulatory Visit: Payer: Self-pay | Admitting: Internal Medicine

## 2022-08-15 ENCOUNTER — Ambulatory Visit: Payer: BC Managed Care – PPO

## 2022-08-15 ENCOUNTER — Ambulatory Visit (HOSPITAL_COMMUNITY)
Admission: RE | Admit: 2022-08-15 | Discharge: 2022-08-15 | Disposition: A | Discharge status: Home / Self Care | Payer: BC Managed Care – PPO | Source: Ambulatory Visit | Attending: Cardiology | Admitting: Cardiology

## 2022-08-15 DIAGNOSIS — M25561 Pain in right knee: Secondary | ICD-10-CM | POA: Diagnosis not present

## 2022-08-15 DIAGNOSIS — I2510 Atherosclerotic heart disease of native coronary artery without angina pectoris: Secondary | ICD-10-CM | POA: Diagnosis not present

## 2022-08-15 DIAGNOSIS — R079 Chest pain, unspecified: Secondary | ICD-10-CM | POA: Insufficient documentation

## 2022-08-15 DIAGNOSIS — G8929 Other chronic pain: Secondary | ICD-10-CM

## 2022-08-15 DIAGNOSIS — M25661 Stiffness of right knee, not elsewhere classified: Secondary | ICD-10-CM

## 2022-08-15 DIAGNOSIS — R931 Abnormal findings on diagnostic imaging of heart and coronary circulation: Secondary | ICD-10-CM | POA: Insufficient documentation

## 2022-08-15 DIAGNOSIS — M6281 Muscle weakness (generalized): Secondary | ICD-10-CM

## 2022-08-15 DIAGNOSIS — E782 Mixed hyperlipidemia: Secondary | ICD-10-CM | POA: Diagnosis present

## 2022-08-15 DIAGNOSIS — R2689 Other abnormalities of gait and mobility: Secondary | ICD-10-CM

## 2022-08-15 DIAGNOSIS — R600 Localized edema: Secondary | ICD-10-CM

## 2022-08-15 MED ORDER — NITROGLYCERIN 0.4 MG SL SUBL
0.8000 mg | SUBLINGUAL_TABLET | Freq: Once | SUBLINGUAL | Status: AC
  Administered 2022-08-15: 0.8 mg via SUBLINGUAL

## 2022-08-15 MED ORDER — IOHEXOL 350 MG/ML SOLN
190.0000 mL | Freq: Once | INTRAVENOUS | Status: AC | PRN
  Administered 2022-08-15: 190 mL via INTRAVENOUS

## 2022-08-15 MED ORDER — METOPROLOL TARTRATE 5 MG/5ML IV SOLN: 10.0000 mg | INTRAVENOUS | Status: DC | PRN

## 2022-08-15 MED ORDER — METOPROLOL TARTRATE 5 MG/5ML IV SOLN
INTRAVENOUS | Status: AC
  Filled 2022-08-15: qty 10

## 2022-08-15 MED ORDER — NITROGLYCERIN 0.4 MG SL SUBL
SUBLINGUAL_TABLET | SUBLINGUAL | Status: AC
  Filled 2022-08-15: qty 2

## 2022-08-15 NOTE — Therapy (Signed)
OUTPATIENT PHYSICAL THERAPY TREATMENT NOTE   Patient Name: Johnny Bradley MRN: [id number] DOB:08/19/1961, 61 y.o., male Today's Date: 08/15/2022  PCP: [name], MD   REFERRING PROVIDER: Aundra Dubin, PA-C   END OF SESSION:   PT End of Session - 08/15/22 1015     Visit Number 22    Number of Visits 25    Date for PT Re-Evaluation 09/05/22    Authorization Type BCBS / [employer]    PT Start Time 1015    PT Stop Time 1057    PT Time Calculation (min) 42 min    Activity Tolerance Patient tolerated treatment well    Behavior During Therapy WFL for tasks assessed/performed                          Past Medical History:  Diagnosis Date   Arthritis    Back pain    Hyperlipidemia    Hypertension    Shoulder pain    Left   Sleep apnea    Past Surgical History:  Procedure Laterality Date   COLONOSCOPY WITH PROPOFOL N/A 08/07/2022   Procedure: COLONOSCOPY WITH PROPOFOL;  Surgeon: [name], MD;  Location: [facility] ENDOSCOPY;  Service: Gastroenterology;  Laterality: N/A;   JOINT REPLACEMENT     KNEE ARTHROSCOPY Right    [facility] Torn   PILONIDAL CYST EXCISION  1980   TOTAL KNEE ARTHROPLASTY Right 04/17/2022   Procedure: RIGHT TOTAL KNEE ARTHROPLASTY;  Surgeon: Leandrew Koyanagi, MD;  Location: [facility];  Service: Orthopedics;  Laterality: Right;   WISDOM TOOTH EXTRACTION     Patient Active Problem List   Diagnosis Date Noted   Adenomatous polyp of colon 08/07/2022   Chest pain on breathing 07/13/2022   Chest pain 07/06/2022   Liver lesion 07/06/2022   Status post total left knee replacement 04/17/2022   Primary osteoarthritis of right knee 11/15/2021   Right shoulder pain 11/01/2021   Abdominal discomfort 11/01/2021   Bilateral knee pain 09/15/2020   OSA (obstructive sleep apnea) 02/03/2020   Chronic knee pain 02/03/2020   Family history of angiosarcoma 04/15/2019   Prediabetes 04/14/2019   HLD (hyperlipidemia) 10/02/2018   Subcutaneous nodule of  right hand 10/02/2018   Skin lesion 05/29/2018   Hypertension 12/03/2017   Encounter for screening colonoscopy 05/25/2017   Obesity 05/25/2017   Right low back pain 12/19/2014    REFERRING DIAG: Hx of total knee replacement, right  THERAPY DIAG:  Chronic pain of right knee  Stiffness of right knee, not elsewhere classified  Muscle weakness (generalized)  Other abnormalities of gait and mobility  Localized edema  Rationale for Evaluation and Treatment Rehabilitation  PERTINENT HISTORY: None  PRECAUTIONS: None   SUBJECTIVE: Patient reports he is doing good without pain.   PAIN:  Are you having pain? No  PATIENT GOALS: Progress to walking without device   OBJECTIVE: (objective measures completed at initial evaluation unless otherwise dated) PATIENT SURVEYS:  FOTO 41% functional status  06/06/2022: 62% - met goal   EDEMA:  Circumferential: Not formally assessed. Patient does exhibit right knee and lower leg swelling compared to left   MUSCLE LENGTH: Limitation in calf, hamstring, and quad flexibility   LOWER EXTREMITY ROM:   Active ROM Right eval Right  06/16/22 Right 06/27/2022 Right Right 08/08/2022  Knee flexion 100  112 115 115  Knee extension 10 lacking 7 lacking Lacking 6 Lacking 6 Lacking 4    LOWER EXTREMITY MMT:  MMT Right eval Left eval Right 06/27/2022 Right 07/11/2022 Right 07/18/2022 Right 08/08/2022  Hip flexion 4- - 4  4+   Hip extension 3 - 4-     Hip abduction 3 - 4-     Knee flexion 4 - 4 4+ 4+ 4+  Knee extension 4- - 4 4+ 4+ 4+    FUNCTIONAL TESTS:  Not assessed   GAIT: - 08/08/2022 Assistive device utilized: None Level of assistance: Independent Comments: limitation in knee extension with stance     TODAY'S TREATMENT: [facility] Adult PT Treatment:                                                DATE: 08/15/22 Therapeutic Exercise: Recumbent level 4 x 5 minutes  Eccentric leg press 3 x 10 @ 80 lbs  Reverse lunge 2 x 10   Lateral,forward, and backward band walks blue band at knees 4 x 20 ft each SL opposite touchdown 12 tall cones; 2 x 10 each   Resisted knee extension with eccentric lowering on RLE 3 x 10 @ 30 lbs  Updated HEP     [facility] Adult PT Treatment:                                                DATE: 08/08/22 Therapeutic Exercise: Recumbent bike L4 x 5 minutes while taking subjective Leg press (cybex): DL 140# 3 x 10; SL 60# 2 x 10 Forward 8" step-up with 10# bilaterally 2 x 10 each Heel raises 2 x 20 Lateral band walk with green at knees 3 x 20 down/back SLS on Airex 3 x 30 sec each Knee extension machine 35# 3 x 10 Knee flexion machine 35# 3 x 10   [facility] Adult PT Treatment:                                                DATE: 08/01/22 Therapeutic Exercise: Recumbent bike level 3 x 5 minutes  Forward lunge with single UE support 2 x 10  SL leg press 3 x 8 @ 50 lbs Lateral step ups 8 inch 2 x 10; RLE Resisted knee extension with eccentric lowering on RLE 3 x 10 @ 30 lbs  Kettlebell squats 2 x 10; 10 lbs  Verbal update to HEP to include utilizing dumbbells for closed chain strengthening.    PATIENT EDUCATION:  Education details: POC; HEP Person educated: Patient Education method: Consulting civil engineer, demo, handout, cues  Education comprehension: Verbalized understanding, returned demo, cues    HOME EXERCISE PROGRAM: Access Code: [email or web address]     ASSESSMENT: CLINICAL IMPRESSION: Patient tolerated therapy well with no adverse effects. Focused on progression of closed chain strengthening with patient demonstrating good control with lunges and squatting activity, though exhibits limited depth. Cues required for breathing with majority of strengthening exercises. He reported mild anterior knee pain with opposite touchdowns on the RLE, but has difficulty controlling for excessive anterior tibial translation when performing this on the RLE. HEP updated to include further strengthening.       OBJECTIVE IMPAIRMENTS Abnormal gait, decreased activity tolerance, decreased balance,  difficulty walking, decreased ROM, decreased strength, increased edema, impaired flexibility, and pain.    ACTIVITY LIMITATIONS lifting, bending, sitting, standing, squatting, stairs, transfers, toileting, dressing, and locomotion level   PARTICIPATION LIMITATIONS: meal prep, cleaning, driving, shopping, community activity, and yard work   PERSONAL FACTORS Fitness, Past/current experiences, and Time since onset of injury/illness/exacerbation are also affecting patient's functional outcome.      GOALS: Goals reviewed with patient? Yes   SHORT TERM GOALS: Target date: 06/06/2022    Patient will be I with initial HEP in order to progress with therapy. Baseline: HEP provided at eval 06/06/2022: Goal status: MET   2.  PT will review FOTO with patient by 3rd visit in order to understand expected progress and outcome with therapy. Baseline: FOTO assessed at eval 06/06/2022: reviewed and reassessed Goal status: MET   3.  Patient will demonstrate right knee AROM 3-115 deg in order to improve gait and functional mobility. Baseline: right knee AROM 10-100 deg 06/06/2022: AROM 7-112 deg 06/27/2022: AROM 6 - 115 deg 08/08/2022: AROM 4 - 115 deg Goal status: PARTIALLY MET   4.  Patient will report right knee pain with activity </= 3/10 in order to reduce functional limitations Baseline: right knee pain 5/10 with activity 06/06/2022: patient denies any pain and minimal pain with activity Goal status: MET   LONG TERM GOALS: Target date: 09/05/2022   Patient will be I with final HEP to maintain progress from PT. Baseline: HEP provided at eval 06/27/2022: progressing 08/08/2022: progressing Goal status: ONGOING   2.  Patient will report >/= 60% status on FOTO to indicate improved functional ability. Baseline: 41% functional status 06/06/2022: 62% Goal status: MET   3.  Patient will demonstrate right  knee AROM 0-120 deg in order to normalize gait and transfers without limitation Baseline:  right knee AROM 10-100 deg 06/27/2022: AROM 6 - 115 deg 08/08/2022: AROM 4 - 115 deg Goal status: PARTIALLY MET   4.  Patient will demonstrate right knee strength grossly = 5/5 MMT in order to improve walking and stair negotiation Baseline: right knee strength limitation (see above) 06/27/2022: right knee strength limitation (see above) 08/08/2022: grossly 4+/5 MMT Goal status: PARTIALLY MET   5.  Patient will ambulate community level distances with LRAD in order to improve community access Baseline:patient ambulating household distances with RW 06/27/2022: ambulating with SPC 08/08/2022: no assistive device Goal status: MET   6.  Patient will report right knee pain with activity </= 1/10 in order to reduce functional limitations Baseline: right knee pain 5/10 with activity 06/27/2022: patient does pain with activity 08/08/2022: patient denies pain with activity Goal status: MET     PLAN: PT FREQUENCY: 1x/week   PT DURATION: 4 weeks   PLANNED INTERVENTIONS: Therapeutic exercises, Therapeutic activity, Neuromuscular re-education, Balance training, Gait training, Patient/Family education, Self Care, Joint mobilization, Joint manipulation, Stair training, Aquatic Therapy, Dry Needling, Cryotherapy, Moist heat, Taping, Vasopneumatic device, Manual therapy, and Re-evaluation   PLAN FOR NEXT SESSION:  review HEP and progress PRN, manual/stretching to improve right knee mobility, strength and balance progression.    Samantha Pexa, PT, DPT, ATC 08/15/22 10:57 AM       

## 2022-08-15 NOTE — Progress Notes (Signed)
Cardiac CT sent for FFR evaluation.

## 2022-08-18 ENCOUNTER — Ambulatory Visit (HOSPITAL_COMMUNITY): Payer: BC Managed Care – PPO

## 2022-08-19 ENCOUNTER — Other Ambulatory Visit: Payer: Self-pay | Admitting: Family Medicine

## 2022-08-19 DIAGNOSIS — E782 Mixed hyperlipidemia: Secondary | ICD-10-CM

## 2022-08-20 ENCOUNTER — Ambulatory Visit (HOSPITAL_COMMUNITY)
Admission: RE | Admit: 2022-08-20 | Discharge: 2022-08-20 | Disposition: A | Discharge status: Home / Self Care | Payer: BC Managed Care – PPO | Source: Ambulatory Visit | Attending: Family | Admitting: Family

## 2022-08-20 ENCOUNTER — Encounter (HOSPITAL_COMMUNITY): Payer: Self-pay

## 2022-08-20 DIAGNOSIS — R1900 Intra-abdominal and pelvic swelling, mass and lump, unspecified site: Secondary | ICD-10-CM

## 2022-08-22 ENCOUNTER — Telehealth (HOSPITAL_BASED_OUTPATIENT_CLINIC_OR_DEPARTMENT_OTHER): Payer: Self-pay

## 2022-08-22 DIAGNOSIS — E782 Mixed hyperlipidemia: Secondary | ICD-10-CM

## 2022-08-22 NOTE — Telephone Encounter (Addendum)
Seen by patient Johnny Bradley on 08/20/2022  9:23 AM; follow up mychart message sent to patient.    ----- Message from Loel Dubonnet, NP sent at 08/19/2022  3:09 PM EST ----- Cardiac CTA reveal moderate coronary artery disease with coronary calcium score of 120. FFR. Reveals no significant reduction in blood flow. Recommend secondary prevention. Continue Aspirin '81mg'$  daily. LDL (bad cholesterol) not at goal of <70. He may either: Increase Pravastatin to daily or add Zetia '10mg'$  daily with repeat FLP/LFT in 2 months

## 2022-08-23 ENCOUNTER — Ambulatory Visit: Payer: BC Managed Care – PPO | Attending: Family Medicine

## 2022-08-23 DIAGNOSIS — R2689 Other abnormalities of gait and mobility: Secondary | ICD-10-CM

## 2022-08-23 DIAGNOSIS — R600 Localized edema: Secondary | ICD-10-CM

## 2022-08-23 DIAGNOSIS — G8929 Other chronic pain: Secondary | ICD-10-CM | POA: Diagnosis present

## 2022-08-23 DIAGNOSIS — M25561 Pain in right knee: Secondary | ICD-10-CM | POA: Insufficient documentation

## 2022-08-23 DIAGNOSIS — M6281 Muscle weakness (generalized): Secondary | ICD-10-CM

## 2022-08-23 DIAGNOSIS — M25661 Stiffness of right knee, not elsewhere classified: Secondary | ICD-10-CM | POA: Diagnosis present

## 2022-08-23 NOTE — Therapy (Signed)
OUTPATIENT PHYSICAL THERAPY TREATMENT NOTE   Patient Name: Johnny Bradley MRN: [id number] DOB:1961-08-12, 62 y.o., male Today's Date: 08/23/2022  PCP: [name], MD   REFERRING PROVIDER: Aundra Dubin, PA-C   END OF SESSION:   PT End of Session - 08/23/22 0929     Visit Number 23    Number of Visits 25    Date for PT Re-Evaluation 09/05/22    Authorization Type BCBS / [employer]    PT Start Time [id number]    PT Stop Time 1013    PT Time Calculation (min) 44 min    Activity Tolerance Patient tolerated treatment well    Behavior During Therapy [facility] for tasks assessed/performed                          Past Medical History:  Diagnosis Date   Arthritis    Back pain    Hyperlipidemia    Hypertension    Shoulder pain    Left   Sleep apnea    Past Surgical History:  Procedure Laterality Date   COLONOSCOPY WITH PROPOFOL N/A 08/07/2022   Procedure: COLONOSCOPY WITH PROPOFOL;  Surgeon: [name], MD;  Location: [facility] ENDOSCOPY;  Service: Gastroenterology;  Laterality: N/A;   JOINT REPLACEMENT     KNEE ARTHROSCOPY Right    [facility]CL Torn   PILONIDAL CYST EXCISION  1980   TOTAL KNEE ARTHROPLASTY Right 04/17/2022   Procedure: RIGHT TOTAL KNEE ARTHROPLASTY;  Surgeon: Leandrew Koyanagi, MD;  Location: [place];  Service: Orthopedics;  Laterality: Right;   WISDOM TOOTH EXTRACTION     Patient Active Problem List   Diagnosis Date Noted   Adenomatous polyp of colon 08/07/2022   Chest pain on breathing 07/13/2022   Chest pain 07/06/2022   Liver lesion 07/06/2022   Status post total left knee replacement 04/17/2022   Primary osteoarthritis of right knee 11/15/2021   Right shoulder pain 11/01/2021   Abdominal discomfort 11/01/2021   Bilateral knee pain 09/15/2020   OSA (obstructive sleep apnea) 02/03/2020   Chronic knee pain 02/03/2020   Family history of angiosarcoma 04/15/2019   Prediabetes 04/14/2019   HLD (hyperlipidemia) 10/02/2018   Subcutaneous nodule of right  hand 10/02/2018   Skin lesion 05/29/2018   Hypertension 12/03/2017   Encounter for screening colonoscopy 05/25/2017   Obesity 05/25/2017   Right low back pain 12/19/2014    REFERRING DIAG: Hx of total knee replacement, right  THERAPY DIAG:  Chronic pain of right knee  Stiffness of right knee, not elsewhere classified  Muscle weakness (generalized)  Other abnormalities of gait and mobility  Localized edema  Rationale for Evaluation and Treatment Rehabilitation  PERTINENT HISTORY: None  PRECAUTIONS: None   SUBJECTIVE: Patient reports his knee feels good without pain.   PAIN:  Are you having pain? No  PATIENT GOALS: Progress to walking without device   OBJECTIVE: (objective measures completed at initial evaluation unless otherwise dated) PATIENT SURVEYS:  FOTO 41% functional status  06/06/2022: 62% - met goal   EDEMA:  Circumferential: Not formally assessed. Patient does exhibit right knee and lower leg swelling compared to left   MUSCLE LENGTH: Limitation in calf, hamstring, and quad flexibility   LOWER EXTREMITY ROM:   Active ROM Right eval Right  06/16/22 Right 06/27/2022 Right Right 08/08/2022 08/23/22 Right   Knee flexion 100  112 115 115 115  Knee extension 10 lacking 7 lacking Lacking 6 Lacking 6 Lacking 4 Lacking 3  LOWER EXTREMITY MMT:   MMT Right eval Left eval Right 06/27/2022 Right 07/11/2022 Right 07/18/2022 Right 08/08/2022  Hip flexion 4- - 4  4+   Hip extension 3 - 4-     Hip abduction 3 - 4-     Knee flexion 4 - 4 4+ 4+ 4+  Knee extension 4- - 4 4+ 4+ 4+    FUNCTIONAL TESTS:  Not assessed   GAIT: - 08/08/2022 Assistive device utilized: None Level of assistance: Independent Comments: limitation in knee extension with stance     TODAY'S TREATMENT: [facility] Adult PT Treatment:                                                DATE: 08/23/22 Therapeutic Exercise: Recumbent bike level 4 x 5 minutes  Hamstring stretch with strap x 60  sec Supine hip flexor stretch with strap x 60 sec  Leg press 3 x 8 @ 70 lbs  Lateral lunge 2 x 10  Prone HS curl black band 2 x 10  SL squat to table raised height 2 x 10  Updated HEP     [facility] Adult PT Treatment:                                                DATE: 08/15/22 Therapeutic Exercise: Recumbent level 4 x 5 minutes  Eccentric leg press 3 x 10 @ 80 lbs  Reverse lunge 2 x 10  Lateral,forward, and backward band walks blue band at knees 4 x 20 ft each SL opposite touchdown 12 tall cones; 2 x 10 each   Resisted knee extension with eccentric lowering on RLE 3 x 10 @ 30 lbs  Updated HEP     [facility] Adult PT Treatment:                                                DATE: 08/08/22 Therapeutic Exercise: Recumbent bike L4 x 5 minutes while taking subjective Leg press (cybex): DL 140# 3 x 10; SL 60# 2 x 10 Forward 8" step-up with 10# bilaterally 2 x 10 each Heel raises 2 x 20 Lateral band walk with green at knees 3 x 20 down/back SLS on Airex 3 x 30 sec each Knee extension machine 35# 3 x 10 Knee flexion machine 35# 3 x 10   PATIENT EDUCATION:  Education details:  HEP Person educated: Patient Education method: Consulting civil engineer, demo, handout, cues  Education comprehension: Verbalized understanding, returned demo, cues    HOME EXERCISE PROGRAM: Access Code: [email or web address]     ASSESSMENT: CLINICAL IMPRESSION: Patient tolerated therapy well with no adverse effects. Patient is nearing full knee extension AROM, lacking 3 degrees today. Continued progression of closed chain strengthening with patient having some difficulty with isolated loading of the RLE. No reports of knee pain throughout session.    OBJECTIVE IMPAIRMENTS Abnormal gait, decreased activity tolerance, decreased balance, difficulty walking, decreased ROM, decreased strength, increased edema, impaired flexibility, and pain.    ACTIVITY LIMITATIONS lifting, bending, sitting, standing, squatting, stairs, transfers,  toileting, dressing, and locomotion level   PARTICIPATION LIMITATIONS: meal  prep, cleaning, driving, shopping, community activity, and yard work   PERSONAL FACTORS Fitness, Past/current experiences, and Time since onset of injury/illness/exacerbation are also affecting patient's functional outcome.      GOALS: Goals reviewed with patient? Yes   SHORT TERM GOALS: Target date: 06/06/2022    Patient will be I with initial HEP in order to progress with therapy. Baseline: HEP provided at eval 06/06/2022: Goal status: MET   2.  PT will review FOTO with patient by 3rd visit in order to understand expected progress and outcome with therapy. Baseline: FOTO assessed at eval 06/06/2022: reviewed and reassessed Goal status: MET   3.  Patient will demonstrate right knee AROM 3-115 deg in order to improve gait and functional mobility. Baseline: right knee AROM 10-100 deg 06/06/2022: AROM 7-112 deg 06/27/2022: AROM 6 - 115 deg 08/08/2022: AROM 4 - 115 deg Goal status: PARTIALLY MET   4.  Patient will report right knee pain with activity </= 3/10 in order to reduce functional limitations Baseline: right knee pain 5/10 with activity 06/06/2022: patient denies any pain and minimal pain with activity Goal status: MET   LONG TERM GOALS: Target date: 09/05/2022   Patient will be I with final HEP to maintain progress from PT. Baseline: HEP provided at eval 06/27/2022: progressing 08/08/2022: progressing Goal status: ONGOING   2.  Patient will report >/= 60% status on FOTO to indicate improved functional ability. Baseline: 41% functional status 06/06/2022: 62% Goal status: MET   3.  Patient will demonstrate right knee AROM 0-120 deg in order to normalize gait and transfers without limitation Baseline:  right knee AROM 10-100 deg 06/27/2022: AROM 6 - 115 deg 08/08/2022: AROM 4 - 115 deg Goal status: PARTIALLY MET   4.  Patient will demonstrate right knee strength grossly = 5/5 MMT in order  to improve walking and stair negotiation Baseline: right knee strength limitation (see above) 06/27/2022: right knee strength limitation (see above) 08/08/2022: grossly 4+/5 MMT Goal status: PARTIALLY MET   5.  Patient will ambulate community level distances with LRAD in order to improve community access [place] ambulating household distances with RW 06/27/2022: ambulating with [facility] 08/08/2022: no assistive device Goal status: MET   6.  Patient will report right knee pain with activity </= 1/10 in order to reduce functional limitations Baseline: right knee pain 5/10 with activity 06/27/2022: patient does pain with activity 08/08/2022: patient denies pain with activity Goal status: MET     PLAN: PT FREQUENCY: 1x/week   PT DURATION: 4 weeks   PLANNED INTERVENTIONS: Therapeutic exercises, Therapeutic activity, Neuromuscular re-education, Balance training, Gait training, Patient/Family education, Self Care, Joint mobilization, Joint manipulation, Stair training, Aquatic Therapy, Dry Needling, Cryotherapy, Moist heat, Taping, Vasopneumatic device, Manual therapy, and Re-evaluation   PLAN FOR NEXT SESSION:  review HEP and progress PRN, manual/stretching to improve right knee mobility, strength and balance progression.    Gwendolyn Grant, PT, DPT, ATC 08/23/22 10:14 AM

## 2022-08-24 ENCOUNTER — Other Ambulatory Visit (HOSPITAL_BASED_OUTPATIENT_CLINIC_OR_DEPARTMENT_OTHER): Payer: Self-pay

## 2022-08-24 DIAGNOSIS — E782 Mixed hyperlipidemia: Secondary | ICD-10-CM

## 2022-08-24 LAB — BASIC METABOLIC PANEL
BUN/Creatinine Ratio: 11 (ref 10–24)
BUN: 11 mg/dL (ref 8–27)
CO2: 25 mmol/L (ref 20–29)
Calcium: 9.4 mg/dL (ref 8.6–10.2)
Chloride: 100 mmol/L (ref 96–106)
Creatinine, Ser: 0.98 mg/dL (ref 0.76–1.27)
Glucose: 93 mg/dL (ref 70–99)
Potassium: 4.3 mmol/L (ref 3.5–5.2)
Sodium: 140 mmol/L (ref 134–144)
eGFR: 87 mL/min/{1.73_m2} (ref >59)

## 2022-08-24 LAB — HEPATIC FUNCTION PANEL
ALT: 24 IU/L (ref 0–44)
AST: 24 IU/L (ref 0–40)
Albumin: 4.3 g/dL (ref 3.9–4.9)
Alkaline Phosphatase: 57 IU/L (ref 44–121)
Bilirubin Total: 0.3 mg/dL (ref 0.0–1.2)
Bilirubin, Direct: <0.1 mg/dL (ref 0.00–0.40)
Total Protein: 7.2 g/dL (ref 6.0–8.5)

## 2022-08-24 LAB — LIPID PANEL
Chol/HDL Ratio: 6.3 ratio — ABNORMAL HIGH (ref 0.0–5.0)
Cholesterol, Total: 200 mg/dL — ABNORMAL HIGH (ref 100–199)
HDL: 32 mg/dL — ABNORMAL LOW (ref >39)
LDL Chol Calc (NIH): 111 mg/dL — ABNORMAL HIGH (ref 0–99)
Triglycerides: 327 mg/dL — ABNORMAL HIGH (ref 0–149)
VLDL Cholesterol Cal: 57 mg/dL — ABNORMAL HIGH (ref 5–40)

## 2022-08-24 MED ORDER — PRAVASTATIN SODIUM 20 MG PO TABS: 20.0000 mg | ORAL_TABLET | Freq: Every day | ORAL | 3 refills | Status: DC

## 2022-08-24 MED ORDER — EZETIMIBE 10 MG PO TABS: 10.0000 mg | ORAL_TABLET | Freq: Every day | ORAL | 3 refills | Status: DC

## 2022-08-24 NOTE — Addendum Note (Signed)
Addended by: Gerald Stabs on: 08/24/2022 08:48 AM   Modules accepted: Orders

## 2022-08-25 ENCOUNTER — Other Ambulatory Visit: Payer: Self-pay | Admitting: Family Medicine

## 2022-08-25 NOTE — Telephone Encounter (Signed)
Pravastatin Prior auth requested via cover my meds;    "[facility] has indicated that it is too soon to refill this medication at the pharmacy for your patient. If you need to renew an existing PA for your patient's medication, please reach out to [place] directly at [phone]"   No further action required at this time!

## 2022-08-28 NOTE — Therapy (Signed)
OUTPATIENT PHYSICAL THERAPY TREATMENT NOTE   Patient Name: Johnny Bradley MRN: [id number] DOB:Nov 25, 1960, 62 y.o., male Today's Date: 08/29/2022  PCP: [name], MD   REFERRING PROVIDER: Aundra Dubin, PA-C   END OF SESSION:   PT End of Session - 08/29/22 0933     Visit Number 24    Number of Visits 25    Date for PT Re-Evaluation 09/05/22    Authorization Type [employer] / [employer]    PT Start Time 0930    PT Stop Time 1015    PT Time Calculation (min) 45 min    Activity Tolerance Patient tolerated treatment well    Behavior During Therapy WFL for tasks assessed/performed                           Past Medical History:  Diagnosis Date   Arthritis    Back pain    Hyperlipidemia    Hypertension    Shoulder pain    Left   Sleep apnea    Past Surgical History:  Procedure Laterality Date   COLONOSCOPY WITH PROPOFOL N/A 08/07/2022   Procedure: COLONOSCOPY WITH PROPOFOL;  Surgeon: [name], MD;  Location: [facility] ENDOSCOPY;  Service: Gastroenterology;  Laterality: N/A;   JOINT REPLACEMENT     KNEE ARTHROSCOPY Right    [facility] T[facility]   PILONIDAL CYST EXCISION  1980   TOTAL KNEE ARTHROPLASTY Right 04/17/2022   Procedure: RIGHT TOTAL KNEE ARTHROPLASTY;  Surgeon: Leandrew Koyanagi, MD;  Location: [place];  Service: Orthopedics;  Laterality: Right;   WISDOM TOOTH EXTRACTION     Patient Active Problem List   Diagnosis Date Noted   Adenomatous polyp of colon 08/07/2022   Chest pain on breathing 07/13/2022   Chest pain 07/06/2022   Liver lesion 07/06/2022   Status post total left knee replacement 04/17/2022   Primary osteoarthritis of right knee 11/15/2021   Right shoulder pain 11/01/2021   Abdominal discomfort 11/01/2021   Bilateral knee pain 09/15/2020   OSA (obstructive sleep apnea) 02/03/2020   Chronic knee pain 02/03/2020   Family history of angiosarcoma 04/15/2019   Prediabetes 04/14/2019   HLD (hyperlipidemia) 10/02/2018   Subcutaneous nodule of  right hand 10/02/2018   Skin lesion 05/29/2018   Hypertension 12/03/2017   Encounter for screening colonoscopy 05/25/2017   Obesity 05/25/2017   Right low back pain 12/19/2014    REFERRING DIAG: Hx of total knee replacement, right  THERAPY DIAG:  Chronic pain of right knee  Stiffness of right knee, not elsewhere classified  Muscle weakness (generalized)  Other abnormalities of gait and mobility  Localized edema  Rationale for Evaluation and Treatment Rehabilitation  PERTINENT HISTORY: None  PRECAUTIONS: None   SUBJECTIVE: Patient reports he is doing well with no new issues. He has been consistent with exercises.  PAIN:  Are you having pain? No  PATIENT GOALS: Progress to walking without device   OBJECTIVE: (objective measures completed at initial evaluation unless otherwise dated) PATIENT SURVEYS:  FOTO 41% functional status  06/06/2022: 62% - met goal   EDEMA:  Circumferential: Not formally assessed. Patient does exhibit right knee and lower leg swelling compared to left   MUSCLE LENGTH: Limitation in calf, hamstring, and quad flexibility   LOWER EXTREMITY ROM:   Active ROM Right eval Right  06/16/22 Right 06/27/2022 Right Right 08/08/2022 08/23/22 Right   Knee flexion 100  112 115 115 115  Knee extension 10 lacking 7 lacking Lacking 6  Lacking 6 Lacking 4 Lacking 3    LOWER EXTREMITY MMT:   MMT Right eval Left eval Right 06/27/2022 Right 07/11/2022 Right 07/18/2022 Right 08/08/2022 Right 08/29/2022  Hip flexion 4- - 4  4+    Hip extension 3 - 4-      Hip abduction 3 - 4-      Knee flexion 4 - 4 4+ 4+ 4+ 4+  Knee extension 4- - 4 4+ 4+ 4+ 4+    FUNCTIONAL TESTS:  Not assessed   GAIT: - 08/08/2022 Assistive device utilized: None Level of assistance: Independent Comments: limitation in knee extension with stance     TODAY'S TREATMENT: [facility] Adult PT Treatment:                                                DATE: 08/29/22 Therapeutic  Exercise: Recumbent bike L4 x 5 minutes  while taking subjective Leg press (cybex) DL 160# 3 x 15, SL 80# 2 x 10 each TRX squat 2 x 10 Runner 8" step-up 2 x 10 each Heel raises edge of step 2 x 20 Lateral band walks with green at knees 2 x 30 down/back Knee extension machine 45# 3 x 10 Knee flexion machine 45# 3 x 10   [facility] Adult PT Treatment:                                                DATE: 08/23/22 Therapeutic Exercise: Recumbent bike level 4 x 5 minutes  Hamstring stretch with strap x 60 sec Supine hip flexor stretch with strap x 60 sec  Leg press 3 x 8 @ 70 lbs  Lateral lunge 2 x 10  Prone HS curl black band 2 x 10  SL squat to table raised height 2 x 10  Updated HEP   [facility] Adult PT Treatment:                                                DATE: 08/15/22 Therapeutic Exercise: Recumbent level 4 x 5 minutes  Eccentric leg press 3 x 10 @ 80 lbs  Reverse lunge 2 x 10  Lateral,forward, and backward band walks blue band at knees 4 x 20 ft each SL opposite touchdown 12 tall cones; 2 x 10 each   Resisted knee extension with eccentric lowering on RLE 3 x 10 @ 30 lbs  Updated HEP  [facility] Adult PT Treatment:                                                DATE: 08/08/22 Therapeutic Exercise: Recumbent bike L4 x 5 minutes while taking subjective Leg press (cybex): DL 140# 3 x 10; SL 60# 2 x 10 Forward 8" step-up with 10# bilaterally 2 x 10 each Heel raises 2 x 20 Lateral band walk with green at knees 3 x 20 down/back SLS on Airex 3 x 30 sec each Knee extension machine 35# 3 x 10 Knee flexion  machine 35# 3 x 10  PATIENT EDUCATION:  Education details:  HEP Person educated: Patient Education method: Consulting civil engineer, demo, handout Education comprehension: Verbalized understanding, returned demo, cues    HOME EXERCISE PROGRAM: Access Code: [email or web address]     ASSESSMENT: CLINICAL IMPRESSION: Patient tolerated therapy well with no adverse effects. Therapy focused primarily on progressing  LE strength with good tolerance. He does continue to exhibit some strength deficits of the right knee but overall is improving and progressing with resistance for strengthening exercises. He does require cueing for proper exercise technique. No changes to HEP. Patient would benefit from continued skilled PT to progress his mobility and strength to reduce pain and maximize functional ability.    OBJECTIVE IMPAIRMENTS Abnormal gait, decreased activity tolerance, decreased balance, difficulty walking, decreased ROM, decreased strength, increased edema, impaired flexibility, and pain.    ACTIVITY LIMITATIONS lifting, bending, sitting, standing, squatting, stairs, transfers, toileting, dressing, and locomotion level   PARTICIPATION LIMITATIONS: meal prep, cleaning, driving, shopping, community activity, and yard work   [place], Past/current experiences, and Time since onset of injury/illness/exacerbation are also affecting patient's functional outcome.      GOALS: Goals reviewed with patient? Yes   SHORT TERM GOALS: Target date: 06/06/2022    Patient will be I with initial HEP in order to progress with therapy. Baseline: HEP provided at eval 06/06/2022: Goal status: MET   2.  PT will review FOTO with patient by 3rd visit in order to understand expected progress and outcome with therapy. Baseline: FOTO assessed at eval 06/06/2022: reviewed and reassessed Goal status: MET   3.  Patient will demonstrate right knee AROM 3-115 deg in order to improve gait and functional mobility. Baseline: right knee AROM 10-100 deg 06/06/2022: AROM 7-112 deg 06/27/2022: AROM 6 - 115 deg 08/08/2022: AROM 4 - 115 deg Goal status: PARTIALLY MET   4.  Patient will report right knee pain with activity </= 3/10 in order to reduce functional limitations Baseline: right knee pain 5/10 with activity 06/06/2022: patient denies any pain and minimal pain with activity Goal status: MET   LONG TERM  GOALS: Target date: 09/05/2022   Patient will be I with final HEP to maintain progress from PT. Baseline: HEP provided at eval 06/27/2022: progressing 08/08/2022: progressing Goal status: ONGOING   2.  Patient will report >/= 60% status on FOTO to indicate improved functional ability. Baseline: 41% functional status 06/06/2022: 62% Goal status: MET   3.  Patient will demonstrate right knee AROM 0-120 deg in order to normalize gait and transfers without limitation Baseline:  right knee AROM 10-100 deg 06/27/2022: AROM 6 - 115 deg 08/08/2022: AROM 4 - 115 deg Goal status: PARTIALLY MET   4.  Patient will demonstrate right knee strength grossly = 5/5 MMT in order to improve walking and stair negotiation Baseline: right knee strength limitation (see above) 06/27/2022: right knee strength limitation (see above) 08/08/2022: grossly 4+/5 MMT Goal status: PARTIALLY MET   5.  Patient will ambulate community level distances with LRAD in order to improve community access [place] ambulating household distances with RW 06/27/2022: ambulating with [facility] 08/08/2022: no assistive device Goal status: MET   6.  Patient will report right knee pain with activity </= 1/10 in order to reduce functional limitations Baseline: right knee pain 5/10 with activity 06/27/2022: patient does pain with activity 08/08/2022: patient denies pain with activity Goal status: MET     PLAN: PT FREQUENCY: 1x/week   PT DURATION: 4 weeks   PLANNED  INTERVENTIONS: Therapeutic exercises, Therapeutic activity, Neuromuscular re-education, Balance training, Gait training, Patient/Family education, Self Care, Joint mobilization, Joint manipulation, Stair training, Aquatic Therapy, Dry Needling, Cryotherapy, Moist heat, Taping, Vasopneumatic device, Manual therapy, and Re-evaluation   PLAN FOR NEXT SESSION:  review HEP and progress PRN, manual/stretching to improve right knee mobility, strength and balance progression.     Hilda Blades, PT, DPT, LAT, ATC 08/29/22  10:18 AM Phone: [phone] Fax: [phone]

## 2022-08-29 ENCOUNTER — Encounter: Payer: Self-pay | Admitting: Physical Therapy

## 2022-08-29 ENCOUNTER — Other Ambulatory Visit: Payer: Self-pay

## 2022-08-29 ENCOUNTER — Ambulatory Visit: Payer: BC Managed Care – PPO | Admitting: Physical Therapy

## 2022-08-29 DIAGNOSIS — M6281 Muscle weakness (generalized): Secondary | ICD-10-CM

## 2022-08-29 DIAGNOSIS — R600 Localized edema: Secondary | ICD-10-CM

## 2022-08-29 DIAGNOSIS — R2689 Other abnormalities of gait and mobility: Secondary | ICD-10-CM

## 2022-08-29 DIAGNOSIS — M25561 Pain in right knee: Secondary | ICD-10-CM | POA: Diagnosis not present

## 2022-08-29 DIAGNOSIS — M25661 Stiffness of right knee, not elsewhere classified: Secondary | ICD-10-CM

## 2022-08-29 DIAGNOSIS — G8929 Other chronic pain: Secondary | ICD-10-CM

## 2022-08-29 NOTE — Patient Instructions (Signed)
Access Code: [email or web address] URL: [email or web address] Date: 08/29/2022 Prepared by: Hilda Blades  Exercises - Seated Hamstring Stretch with Chair  - 1 x daily - 3 reps - 30 seconds hold - Standing Hamstring Stretch on Chair  - 1 x daily - 3 reps - 30 seconds hold - Standing Knee Flexion Stretch on Step  - 1 x daily - 3 reps - 30 seconds hold - Standing Gastroc Stretch at Counter  - 1 x daily - 3 sets - 30 seconds hold - Straight Leg Raise with Ankle Weight  - 1 x daily - 3 sets - 10 reps - Sidelying Hip Abduction with Ankle Weight  - 1 x daily - 3 sets - 10 reps - Prone Hamstring Curl with Ankle Weight  - 1 x daily - 3 sets - 10 reps - Seated Long Arc Quad with Ankle Weight  - 1 x daily - 3 sets - 10 reps - Seated Hamstring Curl with Anchored Resistance  - 1 x daily - 3 sets - 10 reps - Lunge with Counter Support  - 1 x daily - 3 sets - 10 reps - Single Leg Heel Raise with Counter Support  - 1 x daily - 7 x weekly - 3 sets - 10 reps - Standing Single Leg Stance with Counter Support  - 1 x daily - 3 reps - 30 seconds hold - Single Leg Squat with Chair Touch  - 1 x daily - 7 x weekly - 2 sets - 10 reps - Side Stepping with Resistance at Ankles  - 1 x daily - 7 x weekly - 2 sets - 10 reps - Squat  - 1 x daily - 7 x weekly - 2 sets - 10 reps - Reverse Lunge  - 1 x daily - 7 x weekly - 2 sets - 10 reps - Single Leg Balance with Alternating Floor Reaches  - 1 x daily - 7 x weekly - 2 sets - 10 reps - Lateral Lunge  - 1 x daily - 7 x weekly - 2 sets - 10 reps

## 2022-09-04 NOTE — Therapy (Signed)
OUTPATIENT PHYSICAL THERAPY TREATMENT NOTE   Patient Name: Johnny Bradley MRN: [id number] DOB:June 08, 1961, 62 y.o., male Today's Date: 09/04/2022  PCP: [name], MD   REFERRING PROVIDER: Aundra Dubin, PA-C   END OF SESSION:                   Past Medical History:  Diagnosis Date   Arthritis    Back pain    Hyperlipidemia    Hypertension    Shoulder pain    Left   Sleep apnea    Past Surgical History:  Procedure Laterality Date   COLONOSCOPY WITH PROPOFOL N/A 08/07/2022   Procedure: COLONOSCOPY WITH PROPOFOL;  Surgeon: [name], MD;  Location: [facility] ENDOSCOPY;  Service: Gastroenterology;  Laterality: N/A;   JOINT REPLACEMENT     KNEE ARTHROSCOPY Right    [facility]CL Torn   PILONIDAL CYST EXCISION  1980   TOTAL KNEE ARTHROPLASTY Right 04/17/2022   Procedure: RIGHT TOTAL KNEE ARTHROPLASTY;  Surgeon: Leandrew Koyanagi, MD;  Location: [place];  Service: Orthopedics;  Laterality: Right;   WISDOM TOOTH EXTRACTION     Patient Active Problem List   Diagnosis Date Noted   Adenomatous polyp of colon 08/07/2022   Chest pain on breathing 07/13/2022   Chest pain 07/06/2022   Liver lesion 07/06/2022   Status post total left knee replacement 04/17/2022   Primary osteoarthritis of right knee 11/15/2021   Right shoulder pain 11/01/2021   Abdominal discomfort 11/01/2021   Bilateral knee pain 09/15/2020   OSA (obstructive sleep apnea) 02/03/2020   Chronic knee pain 02/03/2020   Family history of angiosarcoma 04/15/2019   Prediabetes 04/14/2019   HLD (hyperlipidemia) 10/02/2018   Subcutaneous nodule of right hand 10/02/2018   Skin lesion 05/29/2018   Hypertension 12/03/2017   Encounter for screening colonoscopy 05/25/2017   Obesity 05/25/2017   Right low back pain 12/19/2014    REFERRING DIAG: Hx of total knee replacement, right  THERAPY DIAG:  No diagnosis found.  Rationale for Evaluation and Treatment Rehabilitation  PERTINENT HISTORY:  None  PRECAUTIONS: None   SUBJECTIVE: Patient reports he is doing well with no new issues. He has been consistent with exercises.  PAIN:  Are you having pain? No  PATIENT GOALS: Progress to walking without device   OBJECTIVE: (objective measures completed at initial evaluation unless otherwise dated) PATIENT SURVEYS:  FOTO 41% functional status  06/06/2022: 62% - met goal   EDEMA:  Circumferential: Not formally assessed. Patient does exhibit right knee and lower leg swelling compared to left   MUSCLE LENGTH: Limitation in calf, hamstring, and quad flexibility   LOWER EXTREMITY ROM:   Active ROM Right eval Right  06/16/22 Right 06/27/2022 Right Right 08/08/2022 08/23/22 Right   Knee flexion 100  112 115 115 115  Knee extension 10 lacking 7 lacking Lacking 6 Lacking 6 Lacking 4 Lacking 3    LOWER EXTREMITY MMT:   MMT Right eval Left eval Right 06/27/2022 Right 07/11/2022 Right 07/18/2022 Right 08/08/2022 Right 08/29/2022  Hip flexion 4- - 4  4+    Hip extension 3 - 4-      Hip abduction 3 - 4-      Knee flexion 4 - 4 4+ 4+ 4+ 4+  Knee extension 4- - 4 4+ 4+ 4+ 4+    FUNCTIONAL TESTS:  Not assessed   GAIT: - 08/08/2022 Assistive device utilized: None Level of assistance: Independent Comments: limitation in knee extension with stance     TODAY'S TREATMENT:  [facility] Adult PT Treatment:                                                DATE: 09/05/22 Therapeutic Exercise: Recumbent bike L4 x 5 minutes  while taking subjective Leg press (cybex) DL 160# 3 x 15, SL 80# 2 x 10 each TRX squat 2 x 10 Runner 8" step-up 2 x 10 each Heel raises edge of step 2 x 20 Lateral band walks with green at knees 2 x 30 down/back Knee extension machine 45# 3 x 10 Knee flexion machine 45# 3 x 10   [facility] Adult PT Treatment:                                                DATE: 08/29/22 Therapeutic Exercise: Recumbent bike L4 x 5 minutes  while taking subjective Leg press (cybex) DL  160# 3 x 15, SL 80# 2 x 10 each TRX squat 2 x 10 Runner 8" step-up 2 x 10 each Heel raises edge of step 2 x 20 Lateral band walks with green at knees 2 x 30 down/back Knee extension machine 45# 3 x 10 Knee flexion machine 45# 3 x 10  [facility] Adult PT Treatment:                                                DATE: 08/23/22 Therapeutic Exercise: Recumbent bike level 4 x 5 minutes  Hamstring stretch with strap x 60 sec Supine hip flexor stretch with strap x 60 sec  Leg press 3 x 8 @ 70 lbs  Lateral lunge 2 x 10  Prone HS curl black band 2 x 10  SL squat to table raised height 2 x 10  Updated HEP   [facility] Adult PT Treatment:                                                DATE: 08/15/22 Therapeutic Exercise: Recumbent level 4 x 5 minutes  Eccentric leg press 3 x 10 @ 80 lbs  Reverse lunge 2 x 10  Lateral,forward, and backward band walks blue band at knees 4 x 20 ft each SL opposite touchdown 12 tall cones; 2 x 10 each   Resisted knee extension with eccentric lowering on RLE 3 x 10 @ 30 lbs  Updated HEP  PATIENT EDUCATION:  Education details:  HEP Person educated: Patient Education method: Consulting civil engineer, Systems developer, handout Education comprehension: Verbalized understanding, returned demo, cues    HOME EXERCISE PROGRAM: Access Code: [email or web address]     ASSESSMENT: CLINICAL IMPRESSION: Patient tolerated therapy well with no adverse effects. ***  Therapy focused primarily on progressing LE strength with good tolerance. He does continue to exhibit some strength deficits of the right knee but overall is improving and progressing with resistance for strengthening exercises. He does require cueing for proper exercise technique. No changes to HEP. Patient would benefit from continued skilled PT to progress his mobility and strength to reduce  pain and maximize functional ability.    OBJECTIVE IMPAIRMENTS Abnormal gait, decreased activity tolerance, decreased balance, difficulty walking, decreased ROM,  decreased strength, increased edema, impaired flexibility, and pain.    ACTIVITY LIMITATIONS lifting, bending, sitting, standing, squatting, stairs, transfers, toileting, dressing, and locomotion level   PARTICIPATION LIMITATIONS: meal prep, cleaning, driving, shopping, community activity, and yard work   [place], Past/current experiences, and Time since onset of injury/illness/exacerbation are also affecting patient's functional outcome.      GOALS: Goals reviewed with patient? Yes   SHORT TERM GOALS: Target date: 06/06/2022    Patient will be I with initial HEP in order to progress with therapy. Baseline: HEP provided at eval 06/06/2022: Goal status: MET   2.  PT will review FOTO with patient by 3rd visit in order to understand expected progress and outcome with therapy. Baseline: FOTO assessed at eval 06/06/2022: reviewed and reassessed Goal status: MET   3.  Patient will demonstrate right knee AROM 3-115 deg in order to improve gait and functional mobility. Baseline: right knee AROM 10-100 deg 06/06/2022: AROM 7-112 deg 06/27/2022: AROM 6 - 115 deg 08/08/2022: AROM 4 - 115 deg Goal status: PARTIALLY MET   4.  Patient will report right knee pain with activity </= 3/10 in order to reduce functional limitations Baseline: right knee pain 5/10 with activity 06/06/2022: patient denies any pain and minimal pain with activity Goal status: MET   LONG TERM GOALS: Target date: 09/05/2022   Patient will be I with final HEP to maintain progress from PT. Baseline: HEP provided at eval 06/27/2022: progressing 08/08/2022: progressing Goal status: ONGOING   2.  Patient will report >/= 60% status on FOTO to indicate improved functional ability. Baseline: 41% functional status 06/06/2022: 62% Goal status: MET   3.  Patient will demonstrate right knee AROM 0-120 deg in order to normalize gait and transfers without limitation Baseline:  right knee AROM 10-100  deg 06/27/2022: AROM 6 - 115 deg 08/08/2022: AROM 4 - 115 deg Goal status: PARTIALLY MET   4.  Patient will demonstrate right knee strength grossly = 5/5 MMT in order to improve walking and stair negotiation Baseline: right knee strength limitation (see above) 06/27/2022: right knee strength limitation (see above) 08/08/2022: grossly 4+/5 MMT Goal status: PARTIALLY MET   5.  Patient will ambulate community level distances with LRAD in order to improve community access [place] ambulating household distances with RW 06/27/2022: ambulating with [facility] 08/08/2022: no assistive device Goal status: MET   6.  Patient will report right knee pain with activity </= 1/10 in order to reduce functional limitations Baseline: right knee pain 5/10 with activity 06/27/2022: patient does pain with activity 08/08/2022: patient denies pain with activity Goal status: MET     PLAN: PT FREQUENCY: 1x/week   PT DURATION: 4 weeks   PLANNED INTERVENTIONS: Therapeutic exercises, Therapeutic activity, Neuromuscular re-education, Balance training, Gait training, Patient/Family education, Self Care, Joint mobilization, Joint manipulation, Stair training, Aquatic Therapy, Dry Needling, Cryotherapy, Moist heat, Taping, Vasopneumatic device, Manual therapy, and Re-evaluation   PLAN FOR NEXT SESSION:  review HEP and progress PRN, manual/stretching to improve right knee mobility, strength and balance progression.    Hilda Blades, PT, DPT, LAT, ATC 09/04/22  1:08 PM Phone: [phone] Fax: [phone]

## 2022-09-05 ENCOUNTER — Other Ambulatory Visit: Payer: Self-pay

## 2022-09-05 ENCOUNTER — Ambulatory Visit: Payer: BC Managed Care – PPO | Admitting: Physical Therapy

## 2022-09-05 ENCOUNTER — Telehealth: Payer: Self-pay | Admitting: Family Medicine

## 2022-09-05 ENCOUNTER — Encounter: Payer: Self-pay | Admitting: Physical Therapy

## 2022-09-05 DIAGNOSIS — M6281 Muscle weakness (generalized): Secondary | ICD-10-CM

## 2022-09-05 DIAGNOSIS — R2689 Other abnormalities of gait and mobility: Secondary | ICD-10-CM

## 2022-09-05 DIAGNOSIS — G8929 Other chronic pain: Secondary | ICD-10-CM

## 2022-09-05 DIAGNOSIS — M25561 Pain in right knee: Secondary | ICD-10-CM | POA: Diagnosis not present

## 2022-09-05 DIAGNOSIS — R600 Localized edema: Secondary | ICD-10-CM

## 2022-09-05 DIAGNOSIS — M25661 Stiffness of right knee, not elsewhere classified: Secondary | ICD-10-CM

## 2022-09-05 NOTE — Telephone Encounter (Addendum)
Pt called to say NP Dutch Quint) asked him to call her regarding the MRI  Pt is saying NP is going to give him something to help him relax prior to the MRI.  Please call Patient back at your earliest convenience.  [phone]

## 2022-09-05 NOTE — Patient Instructions (Signed)
Access Code: [email or web address] URL: [email or web address] Date: 09/05/2022 Prepared by: Hilda Blades  Exercises - Seated Hamstring Stretch with Chair  - 1 x daily - 3 reps - 30 seconds hold - Standing Hamstring Stretch on Chair  - 1 x daily - 3 reps - 30 seconds hold - Standing Knee Flexion Stretch on Step  - 1 x daily - 3 reps - 30 seconds hold - Standing Gastroc Stretch at Counter  - 1 x daily - 3 sets - 30 seconds hold - Straight Leg Raise with Ankle Weight  - 1 x daily - 3 sets - 10 reps - Sidelying Hip Abduction with Ankle Weight  - 1 x daily - 3 sets - 10 reps - Prone Hamstring Curl with Ankle Weight  - 1 x daily - 3 sets - 10 reps - Seated Long Arc Quad with Ankle Weight  - 1 x daily - 3 sets - 10 reps - Seated Hamstring Curl with Anchored Resistance  - 1 x daily - 3 sets - 10 reps - Lunge with Counter Support  - 1 x daily - 3 sets - 10 reps - Single Leg Heel Raise with Counter Support  - 1 x daily - 7 x weekly - 3 sets - 10 reps - Standing Single Leg Stance with Counter Support  - 1 x daily - 3 reps - 30 seconds hold - Single Leg Squat with Chair Touch  - 1 x daily - 7 x weekly - 2 sets - 10 reps - Side Stepping with Resistance at Ankles  - 1 x daily - 7 x weekly - 2 sets - 10 reps - Squat  - 1 x daily - 7 x weekly - 2 sets - 10 reps - Reverse Lunge  - 1 x daily - 7 x weekly - 2 sets - 10 reps - Lateral Lunge  - 1 x daily - 7 x weekly - 2 sets - 10 reps - Single Leg Balance with Alternating Floor Reaches  - 1 x daily - 7 x weekly - 2 sets - 10 reps

## 2022-09-06 ENCOUNTER — Other Ambulatory Visit: Payer: Self-pay | Admitting: Family

## 2022-09-06 MED ORDER — DIAZEPAM 5 MG PO TABS: 5.0000 mg | ORAL_TABLET | ORAL | 0 refills | Status: DC

## 2022-09-24 ENCOUNTER — Encounter (HOSPITAL_BASED_OUTPATIENT_CLINIC_OR_DEPARTMENT_OTHER): Payer: Self-pay | Admitting: Cardiology

## 2022-09-26 ENCOUNTER — Encounter (HOSPITAL_BASED_OUTPATIENT_CLINIC_OR_DEPARTMENT_OTHER): Payer: Self-pay | Admitting: Cardiology

## 2022-09-26 ENCOUNTER — Ambulatory Visit (INDEPENDENT_AMBULATORY_CARE_PROVIDER_SITE_OTHER): Payer: BC Managed Care – PPO | Admitting: Cardiology

## 2022-09-26 VITALS — BP 120/80 | HR 76 | Ht 70.0 in | Wt 261.0 lb

## 2022-09-26 DIAGNOSIS — E782 Mixed hyperlipidemia: Secondary | ICD-10-CM | POA: Diagnosis not present

## 2022-09-26 DIAGNOSIS — Z7189 Other specified counseling: Secondary | ICD-10-CM | POA: Diagnosis not present

## 2022-09-26 DIAGNOSIS — I2510 Atherosclerotic heart disease of native coronary artery without angina pectoris: Secondary | ICD-10-CM

## 2022-09-26 DIAGNOSIS — K760 Fatty (change of) liver, not elsewhere classified: Secondary | ICD-10-CM | POA: Diagnosis not present

## 2022-09-26 NOTE — Patient Instructions (Signed)
Medication Instructions:  Your Physician recommend you continue on your current medication as directed.    *If you need a refill on your cardiac medications before your next appointment, please call your pharmacy*  Follow-Up: At [facility], you and your health needs are our priority.  As part of our continuing mission to provide you with exceptional heart care, we have created designated Provider Care Teams.  These Care Teams include your primary Cardiologist (physician) and Advanced Practice Providers (APPs -  Physician Assistants and Nurse Practitioners) who all work together to provide you with the care you need, when you need it.  We recommend signing up for the patient portal called "MyChart".  Sign up information is provided on this After Visit Summary.  MyChart is used to connect with patients for Virtual Visits (Telemedicine).  Patients are able to view lab/test results, encounter notes, upcoming appointments, etc.  Non-urgent messages can be sent to your provider as well.   To learn more about what you can do with MyChart, go to [email or web address].    Your next appointment:   6 month(s)  Provider:   Bridgette Christopher, MD   

## 2022-09-26 NOTE — Progress Notes (Signed)
Cardiology Office Note:    Date:  09/26/2022   ID:  Johnny Bradley, DOB 07-19-1961, MRN [id number]  PCP:  [name], MD  Cardiologist:  Buford Dresser, MD  Referring MD: [name], MD   CC:  follow up  History of Present Illness:    Johnny Bradley is a 62 y.o. male with a hx of CAD by CT, hypertension, hyperlipidemia, arthritis, and sleep apnea, here for follow up. I initially met him 08/04/22 for chest pain evaluation  CV risk factors:  -Alcohol: no hard liquor or wine. Only beer on the weekends. He drinks 8 to 10 Budweiser 55 beers at a time. He does not drink hard liquor or wine. And he always is drinking either water or diet/ diet caffeine free soda.  -Tobacco: Not a smoker. He has smoked no more than 12 cigars in his life.  -Exercise level: Before 2011, he was able to run multiple miles a day or be on the elliptical for 45 minutes with no issues. But now he gets shortness of breath quickly, especially when using stairs. He is not very active anymore. He had a total knee replacement in August. He wants to wait at least 3 more months before he does a stress test. -Family history:  His father had pacemaker and passed away in his sleep at age 14 though nobody else in his family has had any heart issues.    On 08/15/2022 he had coronary Ca score of 120. A coronary FFR performed on the same day showed no significant stenosis.   Today, he reports that he is doing pretty well today. He has not had any issues with chest pain recently. He has had a knee replacement since his last visit and is doing well post op. He has lost about 10 pounds since the procedure.   He is currently taking the pravastatin every other day and all of his other medications daily. His elevated lab work recently was due to him not being fasted for the testing (triglycerides were significantly elevated compared to prior labs). HDL and LDL were also elevated.   He has not been experiencing any LE  edema and his swelling from his knee replacement has been steadily going down. He is still having some tingling in the leg but he has been able to walk about 4-5 miles a day.   He denies any palpitations, chest pain, shortness of breath, or peripheral edema. No lightheadedness, headaches, syncope, orthopnea, or PND.     Past Medical History:  Diagnosis Date   Arthritis    Back pain    Hyperlipidemia    Hypertension    Shoulder pain    Left   Sleep apnea     Past Surgical History:  Procedure Laterality Date   COLONOSCOPY WITH PROPOFOL N/A 08/07/2022   Procedure: COLONOSCOPY WITH PROPOFOL;  Surgeon: [name], MD;  Location: [facility] ENDOSCOPY;  Service: Gastroenterology;  Laterality: N/A;   JOINT REPLACEMENT     KNEE ARTHROSCOPY Right    [facility] Torn   PILONIDAL CYST EXCISION  1980   TOTAL KNEE ARTHROPLASTY Right 04/17/2022   Procedure: RIGHT TOTAL KNEE ARTHROPLASTY;  Surgeon: Leandrew Koyanagi, MD;  Location: [facility];  Service: Orthopedics;  Laterality: Right;   WISDOM TOOTH EXTRACTION      Current Medications: Current Outpatient Medications on File Prior to Visit  Medication Sig   amLODipine (NORVASC) 5 MG tablet TAKE 1 TABLET BY MOUTH DAILY; **MUST CALL MD FOR APPOINTMENT FOR FUTURE  REFILLS   amoxicillin (AMOXIL) 500 MG capsule Take four pills one hour prior to dental work/colonoscopy   aspirin EC 81 MG tablet Take 81 mg by mouth daily. Swallow whole.   diazepam (VALIUM) 5 MG tablet Take 1 tablet (5 mg total) by mouth 30 (thirty) minutes before procedure for 1 dose.   ezetimibe (ZETIA) 10 MG tablet Take 1 tablet (10 mg total) by mouth daily.   Omega-3 Fatty Acids (FISH OIL) 300 MG CAPS Take by mouth.   pravastatin (PRAVACHOL) 20 MG tablet Take 20 mg by mouth. Every Monday, Wednesday, and Friday   No current facility-administered medications on file prior to visit.     Allergies:   Patient has no known allergies.   Social History   Tobacco Use   Smoking status: Never    Smokeless tobacco: Never  Vaping Use   Vaping Use: Never used  Substance Use Topics   Alcohol use: Yes    Alcohol/week: 12.0 standard drinks of alcohol    Types: 12 Cans of beer per week    Comment: On weekends- Miller 62    Drug use: No    Family History: family history includes Heart disease in his father; Stroke in his paternal grandmother.  ROS:   Please see the history of present illness. (+) LLE swelling  (recent knee replacement) All other systems are reviewed and negative.     EKGs/Labs/Other Studies Reviewed:    The following studies were reviewed today: Coronary FFR CT 08/15/2022: IMPRESSION: 1.  CT FFR analysis did not show any significant stenosis.  Coronary Ca Score 08/15/2022:  IMPRESSION: 1. No acute extracardiac findings. 2. Hepatic steatosis.  CTA Chest  06/29/2022: IMPRESSION: There is no evidence of pulmonary artery embolism. There is no evidence of thoracic aortic dissection. Scattered coronary artery calcifications are seen.   There is no focal pulmonary consolidation. There is no pleural effusion or pneumothorax.   Fatty liver.  Possible 1.8 cm hepatic cyst.  ETT  05/13/2018: Blood pressure demonstrated a normal response to exercise. There was no ST segment deviation noted during stress. No T wave inversion was noted during stress.   Normal stress test with no evidence of ischemia. Good exercise capacity with an exercise duration of 8 minutes and 53 seconds.  Echo  04/04/2018: Study Conclusions   - Left ventricle: The cavity size was normal. Wall thickness was    normal. Systolic function was normal. The estimated ejection    fraction was in the range of 50% to 55%.  - Aortic valve: Valve area (VTI): 2.39 cm^2. Valve area (Vmax):    2.78 cm^2. Valve area (Vmean): 2.65 cm^2.  - Mitral valve: There was mild regurgitation. Valve area by    continuity equation (using LVOT flow): 2.95 cm^2.    EKG:  EKG is personally reviewed.    08/04/2022:  NSR at 78 bpm, IVCD 09/26/2022: not ordered today  Recent Labs: 06/28/2022: Hemoglobin 15.2; Platelets 181 08/23/2022: ALT 24; BUN 11; Creatinine, Ser 0.98; Potassium 4.3; Sodium 140   Recent Lipid Panel    Component Value Date/Time   CHOL 200 (H) 08/23/2022 1554   TRIG 327 (H) 08/23/2022 1554   HDL 32 (L) 08/23/2022 1554   CHOLHDL 6.3 (H) 08/23/2022 1554   CHOLHDL 4 11/01/2021 1008   VLDL 35.0 11/01/2021 1008   LDLCALC 111 (H) 08/23/2022 1554   LDLDIRECT 157.0 10/11/2020 1507    Physical Exam:    VS:  BP 120/80   Pulse 76   Ht  $'5\' 10"'N$  (1.778 m)   Wt 261 lb (118.4 kg)   BMI 37.45 kg/m     Wt Readings from Last 3 Encounters:  09/26/22 261 lb (118.4 kg)  08/07/22 261 lb 12.8 oz (118.8 kg)  08/04/22 273 lb 12.8 oz (124.2 kg)    GEN: Well nourished, well developed in no acute distress HEENT: Normal, moist mucous membranes NECK: No JVD CARDIAC: regular rhythm, normal S1 and S2, no rubs or gallops. No murmur. VASCULAR: Radial and DP pulses 2+ bilaterally. No carotid bruits RESPIRATORY:  Clear to auscultation without rales, wheezing or rhonchi  ABDOMEN: Soft, non-tender, non-distended MUSCULOSKELETAL:  Ambulates independently SKIN: Warm and dry, no edema NEUROLOGIC:  Alert and oriented x 3. No focal neuro deficits noted. PSYCHIATRIC:  Normal affect    ASSESSMENT:    1. Nonocclusive coronary atherosclerosis of native coronary artery   2. Mixed hyperlipidemia   3. Hepatic steatosis   4. Counseling on health promotion and disease prevention   5. Cardiac risk counseling     PLAN:    Nonobstructive CAD Mixed hyperlipidemia Hepatic steatosis -we reviewed his CT and FFR results at length today -he was started on zetia after the scan. Cannot tolerate higher dose/intensity statin -he may be a candidate for the victorion trial. He is interested, sent message to team today -reviewed red flag warning signs that need immediate medical attention  -for now, continue  aspirin, pravastatin, and zetia  Cardiac risk counseling and prevention recommendations: -recommend heart healthy/Mediterranean diet, with whole grains, fruits, vegetable, fish, lean meats, nuts, and olive oil. Limit salt. -recommend moderate walking, 3-5 times/week for 30-50 minutes each session. Aim for at least 150 minutes.week. Goal should be pace of 3 miles/hours, or walking 1.5 miles in 30 minutes -recommend avoidance of tobacco products. Avoid excess alcohol. -ASCVD risk score: The 10-year ASCVD risk score (Arnett DK, et al., 2019) is: 15%   Values used to calculate the score:     Age: 40 years     Sex: Male     Is Non-Hispanic African American: No     Diabetic: No     Tobacco smoker: No     Systolic Blood Pressure: 295 mmHg     Is BP treated: Yes     HDL Cholesterol: 32 mg/dL     Total Cholesterol: 200 mg/dL    Plan for follow up:  6 months  Buford Dresser, MD, PhD, [facility]    Medication Adjustments/Labs and Tests Ordered: Current medicines are reviewed at length with the patient today.  Concerns regarding medicines are outlined above.   No orders of the defined types were placed in this encounter.  No orders of the defined types were placed in this encounter.  Patient Instructions  Medication Instructions:  Your Physician recommend you continue on your current medication as directed.    *If you need a refill on your cardiac medications before your next appointment, please call your pharmacy*  Follow-Up: At [facility], you and your health needs are our priority.  As part of our continuing mission to provide you with exceptional heart care, we have created designated Provider Care Teams.  These Care Teams include your primary Cardiologist (physician) and Advanced Practice Providers (APPs -  Physician Assistants and Nurse Practitioners) who all work together to provide you with the care you need, when you need it.  We recommend  signing up for the patient portal called "MyChart".  Sign up information is provided on  this After Visit Summary.  MyChart is used to connect with patients for Virtual Visits (Telemedicine).  Patients are able to view lab/test results, encounter notes, upcoming appointments, etc.  Non-urgent messages can be sent to your provider as well.   To learn more about what you can do with MyChart, go to [email or web address].    Your next appointment:   6 month(s)  Provider:   Buford Dresser, MD       Alphonzo Grieve as a scribe for Buford Dresser, MD.,have documented all relevant documentation on the behalf of Buford Dresser, MD,as directed by  Buford Dresser, MD while in the presence of Buford Dresser, MD.   I, Buford Dresser, MD, have reviewed all documentation for this visit. The documentation on 09/26/22 for the exam, diagnosis, procedures, and orders are all accurate and complete.   Signed, Buford Dresser, MD PhD 09/26/2022     [place]

## 2022-10-02 ENCOUNTER — Encounter: Payer: Self-pay | Admitting: *Deleted

## 2022-10-02 DIAGNOSIS — Z006 Encounter for examination for normal comparison and control in clinical research program: Secondary | ICD-10-CM

## 2022-10-02 NOTE — Research (Signed)
Spoke with Johnny Bradley about V1p research. States he would like more information. Emailed him a copy of the consent to review. Encouraged him to call with any questions.

## 2022-10-06 ENCOUNTER — Encounter: Payer: Self-pay | Admitting: *Deleted

## 2022-10-06 DIAGNOSIS — Z006 Encounter for examination for normal comparison and control in clinical research program: Secondary | ICD-10-CM

## 2022-10-06 NOTE — Research (Signed)
Spoke with Mr Johnny Bradley. He states he has not looked over the information I sent yet. States he will and will call me back.

## 2022-10-10 ENCOUNTER — Encounter: Payer: Self-pay | Admitting: *Deleted

## 2022-10-10 DIAGNOSIS — Z006 Encounter for examination for normal comparison and control in clinical research program: Secondary | ICD-10-CM

## 2022-10-10 NOTE — Research (Signed)
Spoke with Johnny Bradley about V1p states he wants to talk to his sister first. He states he will call me back.

## 2022-10-11 ENCOUNTER — Telehealth: Payer: Self-pay | Admitting: Family Medicine

## 2022-10-11 NOTE — Telephone Encounter (Signed)
error 

## 2022-10-11 NOTE — Telephone Encounter (Signed)
Pt called stating he went to get an MRI but he could not handle the tube so he wanted to reschedule the appt but the hospital told him the provider has to put in a new order for him. Pt stated he has the medicine for the MRI also

## 2022-11-02 ENCOUNTER — Telehealth: Payer: Self-pay

## 2022-11-02 NOTE — Telephone Encounter (Signed)
Phone was transferred by FD,M. Damaris Schooner to [facility] with [facility].   She pt needs an authorization for his MRI Liver wo contrast with his insurance [employer] state.  Advise her I don't handle the authorization but I can send it to a referral coordinator to help.  She left a contact number incase have further question.   [phone] ext [phone]  Please advise.

## 2022-11-03 ENCOUNTER — Other Ambulatory Visit (HOSPITAL_BASED_OUTPATIENT_CLINIC_OR_DEPARTMENT_OTHER): Payer: Self-pay | Admitting: Family

## 2022-11-03 LAB — LIPID PANEL
Chol/HDL Ratio: 3.2 ratio (ref 0.0–5.0)
Cholesterol, Total: 139 mg/dL (ref 100–199)
HDL: 44 mg/dL (ref >39)
LDL Chol Calc (NIH): 80 mg/dL (ref 0–99)
Triglycerides: 74 mg/dL (ref 0–149)
VLDL Cholesterol Cal: 15 mg/dL (ref 5–40)

## 2022-11-06 ENCOUNTER — Ambulatory Visit: Admission: RE | Admit: 2022-11-06 | Payer: BC Managed Care – PPO | Source: Ambulatory Visit

## 2022-11-07 ENCOUNTER — Ambulatory Visit (INDEPENDENT_AMBULATORY_CARE_PROVIDER_SITE_OTHER): Payer: BC Managed Care – PPO | Admitting: Family Medicine

## 2022-11-07 ENCOUNTER — Encounter: Payer: Self-pay | Admitting: Neurology

## 2022-11-07 VITALS — BP 102/80 | HR 75 | Temp 97.8°F | Ht 70.5 in | Wt 247.6 lb

## 2022-11-07 DIAGNOSIS — R251 Tremor, unspecified: Secondary | ICD-10-CM | POA: Diagnosis not present

## 2022-11-07 DIAGNOSIS — Z0001 Encounter for general adult medical examination with abnormal findings: Secondary | ICD-10-CM | POA: Diagnosis not present

## 2022-11-07 DIAGNOSIS — Z125 Encounter for screening for malignant neoplasm of prostate: Secondary | ICD-10-CM | POA: Insufficient documentation

## 2022-11-07 DIAGNOSIS — R7303 Prediabetes: Secondary | ICD-10-CM

## 2022-11-07 LAB — PSA: PSA: 1.46 ng/mL (ref 0.10–4.00)

## 2022-11-07 LAB — HEMOGLOBIN A1C: Hgb A1c MFr Bld: 6 % (ref 4.6–6.5)

## 2022-11-07 MED ORDER — AMLODIPINE BESYLATE 5 MG PO TABS: ORAL_TABLET | ORAL | 3 refills | Status: DC

## 2022-11-07 NOTE — Progress Notes (Signed)
Tommi Rumps, MD Phone: [phone]  Johnny Bradley is a 62 y.o. male who presents today for CPE.  Diet: low carbs, does juice drinks, cut down on sweets and bread Exercise: walking 7000-8000 steps per day Colonoscopy: 08/07/22 3 year recall Prostate cancer screening: due Family history-  Prostate cancer: no  Colon cancer: no Vaccines-   Flu: UTD  Tetanus: UTD  Shingles: UTD  COVID19: UTD  RSV: due HIV screening: in [place] Screening: UTD Tobacco use: no Alcohol use: cut down quite a bit, occasionally has when goes out to eat Illicit Drug use: no Dentist: yes Ophthalmology: due Trauma: Patient notes a tremor in his right hand generally only if he drinks something though occasionally if he uses plastic silverware.  Does not occur at any other time.  No resting tremor.  Notes it has been going on for years and has gotten a little bit worse recently.   Active Ambulatory Problems    Diagnosis Date Noted   Right low back pain 12/19/2014   Encounter for screening colonoscopy 05/25/2017   Obesity 05/25/2017   Tremor 05/25/2017   Hypertension 12/03/2017   Skin lesion 05/29/2018   HLD (hyperlipidemia) 10/02/2018   Subcutaneous nodule of right hand 10/02/2018   Prediabetes 04/14/2019   Family history of angiosarcoma 04/15/2019   OSA (obstructive sleep apnea) 02/03/2020   Chronic knee pain 02/03/2020   Bilateral knee pain 09/15/2020   Right shoulder pain 11/01/2021   Abdominal discomfort 11/01/2021   Primary osteoarthritis of right knee 11/15/2021   Status post total left knee replacement 04/17/2022   Chest pain 07/06/2022   Liver lesion 07/06/2022   Chest pain on breathing 07/13/2022   Adenomatous polyp of colon 08/07/2022   Encounter for general adult medical examination with abnormal findings 11/07/2022   Resolved Ambulatory Problems    Diagnosis Date Noted   Left shoulder pain 12/19/2014   Right knee pain 12/19/2014   Routine general medical examination  at a health care facility 12/19/2014   Encounter to establish care 12/19/2014   Bunion 05/25/2017   Chill 05/25/2017   Prostatitis 05/25/2017   Light-headed feeling 12/03/2017   Cerumen impaction 12/03/2017   Atypical chest pain 03/11/2018   Dental infection 04/15/2019   Past Medical History:  Diagnosis Date   Arthritis    Back pain    Hyperlipidemia    Shoulder pain    Sleep apnea     Family History  Problem Relation Age of Onset   Heart disease Father    Stroke Paternal Grandmother     Social History   Socioeconomic History   Marital status: Married    Spouse name: Not on file   Number of children: Not on file   Years of education: Not on file   Highest education level: Not on file  Occupational History   Not on file  Tobacco Use   Smoking status: Never   Smokeless tobacco: Never  Vaping Use   Vaping Use: Never used  Substance and Sexual Activity   Alcohol use: Yes    Alcohol/week: 12.0 standard drinks of alcohol    Types: 12 Cans of beer per week    Comment: On weekends- Miller 62    Drug use: No   Sexual activity: Yes    Partners: Female    Comment: Wife  Other Topics Concern   Not on file  Social History Narrative   Computer Dept. Next door- manages paperwork    Lives at home with wife and  daughter & fiance    Son who owns Medical sales representative. And has a son 59 yo    Granddaughter 8 mos from daughter    Pets: 1 cat 2 dogs    Right handed    Caffeine- none   Masters degree    Enjoys- outdoor activities, festivals    Social Determinants of Radio broadcast assistant Strain: Not on file  Food Insecurity: Not on file  Transportation Needs: No Transportation Needs (08/04/2022)   PRAPARE - Hydrologist (Medical): No    Lack of Transportation (Non-Medical): No  Physical Activity: Inactive (08/04/2022)   Exercise Vital Sign    Days of Exercise per Week: 0 days    Minutes of Exercise per Session: 0 min  Stress: Not on file   Social Connections: Not on file  Intimate Partner Violence: Not on file    ROS  General:  Negative for nexplained weight loss, fever Skin: Negative for new or changing mole, sore that won't heal HEENT: Negative for trouble hearing, trouble seeing, ringing in ears, mouth sores, hoarseness, change in voice, dysphagia. CV:  Negative for chest pain, dyspnea, edema, palpitations Resp: Negative for cough, dyspnea, hemoptysis GI: Negative for nausea, vomiting, diarrhea, constipation, abdominal pain, melena, hematochezia. GU: Negative for dysuria, incontinence, urinary hesitance, hematuria, vaginal or penile discharge, polyuria, sexual difficulty, lumps in testicle or breasts MSK: Negative for muscle cramps or aches, joint pain or swelling Neuro: Negative for headaches, weakness, numbness, dizziness, passing out/fainting Psych: Negative for depression, anxiety, memory problems  Objective  Physical Exam Vitals:   11/07/22 0922  BP: 102/80  Pulse: 75  Temp: 97.8 F (36.6 C)  SpO2: 97%    BP Readings from Last 3 Encounters:  11/07/22 102/80  09/26/22 120/80  08/15/22 115/77   Wt Readings from Last 3 Encounters:  11/07/22 247 lb 9.6 oz (112.3 kg)  09/26/22 261 lb (118.4 kg)  08/07/22 261 lb 12.8 oz (118.8 kg)    Physical Exam Constitutional:      General: He is not in acute distress.    Appearance: He is not diaphoretic.  HENT:     Head: Normocephalic and atraumatic.  Cardiovascular:     Rate and Rhythm: Normal rate and regular rhythm.     Heart sounds: Normal heart sounds.  Pulmonary:     Effort: Pulmonary effort is normal.     Breath sounds: Normal breath sounds.  Abdominal:     General: Bowel sounds are normal. There is no distension.     Palpations: Abdomen is soft.     Tenderness: There is no abdominal tenderness.  Musculoskeletal:     Right lower leg: No edema.     Left lower leg: No edema.  Lymphadenopathy:     Cervical: No cervical adenopathy.  Skin:     General: Skin is warm and dry.  Neurological:     Mental Status: He is alert.     Comments: 5/5 strength in bilateral biceps, triceps, grip, quads, hamstrings, plantar and dorsiflexion, sensation to light touch intact in bilateral UE and LE, normal gait, absent patellar, biceps, and brachioradialis reflexes  Psychiatric:        Mood and Affect: Mood normal.      Assessment/Plan:   Encounter for general adult medical examination with abnormal findings Assessment & Plan: Physical exam completed.  Encouraged continued healthy diet and exercise.  I encouraged him to get the RSV vaccine.  Congratulated on cutting down on alcohol intake.  Congratulated on weight loss.  I encouraged him to see his eye doctor.  Will get an A1c and a PSA today.  Lipid panel recently completed.   Tremor Assessment & Plan: Chronic issue.  Likely related to benign essential tremor.  I will refer to neurology to get their input on this.  Orders: -     Ambulatory referral to Neurology  Prostate cancer screening -     PSA  Prediabetes -     Hemoglobin A1c  Other orders -     amLODIPine Besylate; TAKE 1 TABLET BY MOUTH DAILY; **MUST CALL MD FOR APPOINTMENT FOR FUTURE REFILLS  Dispense: 90 tablet; Refill: 3    Return in about 6 months (around 05/10/2023) for HTN.   Tommi Rumps, MD [place]

## 2022-11-07 NOTE — Assessment & Plan Note (Addendum)
Chronic issue.  Likely related to benign essential tremor.  I will refer to neurology to get their input on this.

## 2022-11-07 NOTE — Assessment & Plan Note (Signed)
Physical exam completed.  Encouraged continued healthy diet and exercise.  I encouraged him to get the RSV vaccine.  Congratulated on cutting down on alcohol intake.  Congratulated on weight loss.  I encouraged him to see his eye doctor.  Will get an A1c and a PSA today.  Lipid panel recently completed.

## 2022-11-07 NOTE — Patient Instructions (Signed)
Nice to see you. We will get labs today and contact you with the results.  Neurology should contact you to set up a visit for the tremor.

## 2022-11-08 ENCOUNTER — Encounter: Payer: Self-pay | Admitting: Family Medicine

## 2022-11-09 ENCOUNTER — Telehealth: Payer: Self-pay | Admitting: Family Medicine

## 2022-11-09 NOTE — Telephone Encounter (Signed)
Melissa from [facility] called in staying that pt has a referral for a MRI on Monday 3/25 however, they need Authorization from [facility]. Her contact number its [phone] ext. [place]

## 2022-11-10 ENCOUNTER — Ambulatory Visit (INDEPENDENT_AMBULATORY_CARE_PROVIDER_SITE_OTHER): Payer: BC Managed Care – PPO | Admitting: Orthopaedic Surgery

## 2022-11-10 ENCOUNTER — Other Ambulatory Visit (INDEPENDENT_AMBULATORY_CARE_PROVIDER_SITE_OTHER): Payer: BC Managed Care – PPO

## 2022-11-10 ENCOUNTER — Encounter: Payer: Self-pay | Admitting: Orthopaedic Surgery

## 2022-11-10 DIAGNOSIS — Z96651 Presence of right artificial knee joint: Secondary | ICD-10-CM

## 2022-11-10 NOTE — Progress Notes (Signed)
Office Visit Note   Patient: Johnny Bradley           Date of Birth: 1961/06/09           MRN: [id number] Visit Date: 11/10/2022              Requested by: Johnny Haven, MD [address] [place],  [place] [address] PCP: Johnny Haven, MD   Assessment & Plan: Visit Diagnoses:  1. Status post total right knee replacement     Plan: Implant is stable on x-rays without any evidence of loosening or subsidence.  He is very pleased with the outcome.  He has been very functional.  Dental prophylaxis reinforced.  Activity as tolerated.  Recheck in 6 months with two-view x-rays of the right knee.  Follow-Up Instructions: Return in about 6 months (around 05/13/2023).   Orders:  Orders Placed This Encounter  Procedures   XR Knee 1-2 Views Right   No orders of the defined types were placed in this encounter.     Procedures: No procedures performed   Clinical Data: No additional findings.   Subjective: Chief Complaint  Patient presents with   Right Knee - Follow-up    Right total knee arthroplasty 04/17/2022    HPI  Johnny Bradley is 34-month status post right total knee replacement.  His only complaint is start up stiffness.  Review of Systems   Objective: Vital Signs: There were no vitals taken for this visit.  Physical Exam  Ortho Exam  Examination right knee shows fully healed surgical scar.  Excellent range of motion.  Good varus valgus stability.  Specialty Comments:  No specialty comments available.  Imaging: XR Knee 1-2 Views Right  Result Date: 11/10/2022 Stable right total knee replacement in good alignment     PMFS History: Patient Active Problem List   Diagnosis Date Noted   Encounter for general adult medical examination with abnormal findings 11/07/2022   Adenomatous polyp of colon 08/07/2022   Chest pain on breathing 07/13/2022   Chest pain 07/06/2022   Liver lesion 07/06/2022   Status post total left knee replacement 04/17/2022    Primary osteoarthritis of right knee 11/15/2021   Right shoulder pain 11/01/2021   Abdominal discomfort 11/01/2021   Bilateral knee pain 09/15/2020   OSA (obstructive sleep apnea) 02/03/2020   Chronic knee pain 02/03/2020   Family history of angiosarcoma 04/15/2019   Prediabetes 04/14/2019   HLD (hyperlipidemia) 10/02/2018   Subcutaneous nodule of right hand 10/02/2018   Skin lesion 05/29/2018   Hypertension 12/03/2017   Encounter for screening colonoscopy 05/25/2017   Obesity 05/25/2017   Tremor 05/25/2017   Right low back pain 12/19/2014   Past Medical History:  Diagnosis Date   Arthritis    Back pain    Hyperlipidemia    Hypertension    Shoulder pain    Left   Sleep apnea     Family History  Problem Relation Age of Onset   Heart disease Father    Alcohol abuse Mother    Stroke Paternal Grandmother    Early death Brother     Past Surgical History:  Procedure Laterality Date   COLONOSCOPY WITH PROPOFOL N/A 08/07/2022   Procedure: COLONOSCOPY WITH PROPOFOL;  Surgeon: Johnny Bellows, MD;  Location: [facility] ENDOSCOPY;  Service: Gastroenterology;  Laterality: N/A;   JOINT REPLACEMENT     KNEE ARTHROSCOPY Right    [facility] Torn   PILONIDAL CYST EXCISION  1980   TOTAL KNEE ARTHROPLASTY Right  04/17/2022   Procedure: RIGHT TOTAL KNEE ARTHROPLASTY;  Surgeon: Johnny Koyanagi, MD;  Location: [facility];  Service: Orthopedics;  Laterality: Right;   WISDOM TOOTH EXTRACTION     Social History   Occupational History   Not on file  Tobacco Use   Smoking status: Never   Smokeless tobacco: Never  Vaping Use   Vaping Use: Never used  Substance and Sexual Activity   Alcohol use: Yes    Alcohol/week: 3.0 standard drinks of alcohol    Types: 3 Cans of beer per week    Comment: On weekends- Miller [address]    Drug use: No   Sexual activity: Yes    Partners: Female    Comment: Wife

## 2022-11-13 ENCOUNTER — Encounter: Payer: Self-pay | Admitting: *Deleted

## 2022-11-13 ENCOUNTER — Ambulatory Visit
Admission: RE | Admit: 2022-11-13 | Discharge: 2022-11-13 | Disposition: A | Discharge status: Home / Self Care | Payer: BC Managed Care – PPO | Source: Ambulatory Visit | Attending: Family | Admitting: Family

## 2022-11-13 DIAGNOSIS — R1900 Intra-abdominal and pelvic swelling, mass and lump, unspecified site: Secondary | ICD-10-CM | POA: Insufficient documentation

## 2022-11-13 DIAGNOSIS — Z006 Encounter for examination for normal comparison and control in clinical research program: Secondary | ICD-10-CM

## 2022-11-13 MED ORDER — GADOBUTROL 1 MMOL/ML IV SOLN
10.0000 mL | Freq: Once | INTRAVENOUS | Status: AC | PRN
  Administered 2022-11-13: 10 mL via INTRAVENOUS

## 2022-11-13 NOTE — Research (Signed)
Spoke with Johnny Bradley about V1P research. He states he forgot to discuss the research study with his sister. He requested a copy of the consent to read over. Email sent with a copy for him to read over. Encouraged to call with any questions.

## 2022-11-28 ENCOUNTER — Encounter: Payer: Self-pay | Admitting: *Deleted

## 2022-11-28 DIAGNOSIS — Z006 Encounter for examination for normal comparison and control in clinical research program: Secondary | ICD-10-CM

## 2022-11-28 NOTE — Research (Signed)
Spoke with Mr Ege about V1P research. He states he is interested, but has not read over the information yet. Encouraged him to call with any questions once he reads over the information. Voices understanding.

## 2022-12-04 ENCOUNTER — Encounter: Payer: Self-pay | Admitting: *Deleted

## 2022-12-04 DIAGNOSIS — Z006 Encounter for examination for normal comparison and control in clinical research program: Secondary | ICD-10-CM

## 2022-12-04 NOTE — Research (Signed)
Message left to see if Mr Elayne Snare has any questions about V1p Research. Encouraged him to call back.

## 2022-12-06 ENCOUNTER — Encounter: Payer: Self-pay | Admitting: *Deleted

## 2022-12-06 DIAGNOSIS — Z006 Encounter for examination for normal comparison and control in clinical research program: Secondary | ICD-10-CM

## 2022-12-06 NOTE — Research (Signed)
Spoke with Johnny Bradley, he states that he would like to come in to be seen for V1P. Scheduled him for April 24 at 0900.

## 2022-12-07 NOTE — Progress Notes (Signed)
Assessment/Plan:    Essential tremor -Very little seen on examination today, but he certainly describes it.  He did see some tremor when he was pouring water from 1 glass to another. -Discussed treatments and both the best agreed that he really does not need treatment right now.  We did discuss that his primary care could potentially change his blood pressure medication (amlodipine) to propranolol and perhaps it would help both his blood pressure and tremor.  He can discuss this with primary care. -No evidence of Parkinsons disease -No evidence of dystonic tremor  -Discussed adaptive silverware and I wanted to show him these in the office, but I could not find them  -check tsh  2.  Probable simple Tics  -Patient reported that he has had some movements since childhood and I suspect that these are simple tics.  We discussed nature and pathophysiology of these.  I discussed with him that I do not recommend treatments for this unless he really wanted them, as the treatments are generally felt with side effects.  He does not disagree.  He can certainly let us know if things get worse and we can attempt treatment.  3.  PN  -Has clinical evidence of this on examination  -discussed EMG - decided to hold on that for now  -do labs to include b12, folate, spep/upep with immunofix.  Hgba1c just done.    -Discussed that amlodipine may cause LE edema, and can follow-up with primary care about that  Subjective:   Johnny Bradley was seen today in the movement disorders clinic for neurologic consultation at the request of Glori Luis, MD.  The consultation is for the evaluation of right hand intention tremor.  This has been going on for several years.  Primary care felt this was likely benign essential tremor.  He is sent for further evaluation, to confirm diagnosis.    Tremor: Yes.     How long has it been going on? 5-10 years  At rest or with activation?  activation  When is it noted the  most?  Eating with plasticware  Fam hx of tremor?  No.  Located where?  R hand primarily  Affected by caffeine:  No. (2 - 16 oz diet pepsi per day and then some caffeine free diet pepsi)  Affected by alcohol:  No. (has been decreasing alcohol this year - 12 pack this week but prior to that, last drank at super bowl)  Affected by stress:  unknown  Affected by fatigue:  unknown  Spills soup if on spoon:  No.  Spills glass of liquid if full:  No. but he will notice it when drinking a glass and bringing it to the mouth  Affects ADL's (tying shoes, brushing teeth, etc):  No. (Still shaves with blade)  Tremor inducing meds:  No.  Separate from the tremor, pt c/o "twitches" and its "like a chill."  He's had this for as long as he can remember - maybe up to a dozen times per day.    Other Specific Symptoms:  Voice: sounds good Sleep:   Vivid Dreams:  has occasional very vivid dreams that seem to relate to time spent in [employer] and he will awaken in sweat  Acting out dreams:  No. Postural symptoms:  No.  Falls?  No. Bradykinesia symptoms: no bradykinesia noted Loss of smell:  No. Loss of taste:  No. Urinary Incontinence:  No. Difficulty Swallowing:  No. Handwriting, micrographia: No. Trouble with ADL's:  No.  Trouble buttoning clothing: No. N/V:  No. Lightheaded:  not really but will occasionally have a "brief wave or cloud" that lasts a few seconds  Syncope: No. Diplopia:  No.  Neuroimaging of the brain has not previously been performed.    PREVIOUS MEDICATIONS: none to date  ALLERGIES:  No Known Allergies  CURRENT MEDICATIONS:  Current Outpatient Medications  Medication Instructions   amLODipine (NORVASC) 5 MG tablet TAKE 1 TABLET BY MOUTH DAILY; **MUST CALL MD FOR APPOINTMENT FOR FUTURE REFILLS   aspirin EC 81 mg, Oral, Daily, Swallow whole.   Coenzyme Q10-Red Yeast Rice (CO Q-10 PLUS RED YEAST RICE PO) 2 tablets, Oral, Daily   diazepam (VALIUM) 5 mg, Oral, 30 min pre-op    ezetimibe (ZETIA) 10 mg, Oral, Daily   Omega-3 Fatty Acids (FISH OIL) 300 MG CAPS Oral   pravastatin (PRAVACHOL) 20 mg, Oral, Every Monday, Wednesday, and Friday    Objective:   PHYSICAL EXAMINATION:    VITALS:   Vitals:   12/11/22 0908  BP: 122/78  Pulse: 71  SpO2: 97%  Weight: 258 lb 6.4 oz (117.2 kg)  Height:  (1.778 m)    GEN:  The patient appears stated age and is in NAD. HEENT:  Normocephalic, atraumatic.  The mucous membranes are moist. The superficial temporal arteries are without ropiness or tenderness. CV:  RRR Lungs:  CTAB Neck/HEME:  There are no carotid bruits bilaterally. Skin: There is peripheral/cutaneous discoloration of the skin toward the ankles bilaterally.  There is loss of hair in the same distribution.  He has mild peripheral edema bilaterally in the lower extremities  Neurological examination:  Orientation: The patient is alert and oriented x3.  Cranial nerves: There is good facial symmetry.  Extraocular muscles are intact. The visual fields are full to confrontational testing. The speech is fluent and clear. Soft palate rises symmetrically and there is no tongue deviation. Hearing is intact to conversational tone. Sensation: Sensation is intact to light touch throughout (facial, trunk, extremities). Vibration is decreased distally.  Pinprick is decreased in a stocking distribution. There is no extinction with double simultaneous stimulation.  Motor: Strength is 5/5 in the bilateral upper and lower extremities.   Shoulder shrug is equal and symmetric.  There is no pronator drift. Deep tendon reflexes: Deep tendon reflexes are 0-1/4 at the bilateral biceps, triceps, brachioradialis, patella and achilles. Plantar responses are downgoing bilaterally.  Movement examination: Tone: There is normal tone in the bilateral upper extremities.  The tone in the lower extremities is normal.  Abnormal movements: There is no rest tremor.  There is no postural tremor.   There is no tremor when given a weight.  He does have some mild tremor on the right when pouring water from 1 glass to another.  He does not spill the water.  He has no trouble with Archimedes spirals. Coordination:  There is no decremation with RAM's, with any form of RAMS, including alternating supination and pronation of the forearm, hand opening and closing, finger taps, heel taps and toe taps.  Gait and Station: The patient has no difficulty arising out of a deep-seated chair without the use of the hands. The patient's stride length is good.  He is able to ambulate in a tandem fashion I have reviewed and interpreted the following labs independently   Chemistry      Component Value Date/Time   NA 140 08/23/2022 1559   K 4.3 08/23/2022 1559   CL 100 08/23/2022 1559   CO2  25 08/23/2022 1559   BUN 11 08/23/2022 1559   CREATININE 0.98 08/23/2022 1559      Component Value Date/Time   CALCIUM 9.4 08/23/2022 1559   ALKPHOS 57 08/23/2022 1554   AST 24 08/23/2022 1554   ALT 24 08/23/2022 1554   BILITOT 0.3 08/23/2022 1554      Lab Results  Component Value Date   TSH 2.47 09/30/2020   Lab Results  Component Value Date   WBC 7.4 06/28/2022   HGB 15.2 06/28/2022   HCT 45.8 06/28/2022   MCV 91.8 06/28/2022   PLT 181 06/28/2022   No results found for: "VITAMINB12"    Total time spent on today's visit was 63 minutes, including both face-to-face time and nonface-to-face time.  Time included that spent on review of records (prior notes available to me/labs/imaging if pertinent), discussing treatment and goals, answering patient's questions and coordinating care.  Cc:  Glori Luis, MD

## 2022-12-11 ENCOUNTER — Ambulatory Visit (INDEPENDENT_AMBULATORY_CARE_PROVIDER_SITE_OTHER): Payer: BC Managed Care – PPO | Admitting: Neurology

## 2022-12-11 ENCOUNTER — Other Ambulatory Visit: Payer: Self-pay

## 2022-12-11 ENCOUNTER — Encounter: Payer: Self-pay | Admitting: Neurology

## 2022-12-11 ENCOUNTER — Other Ambulatory Visit (INDEPENDENT_AMBULATORY_CARE_PROVIDER_SITE_OTHER): Payer: BC Managed Care – PPO

## 2022-12-11 VITALS — BP 122/78 | HR 71 | Ht 70.0 in | Wt 258.4 lb

## 2022-12-11 DIAGNOSIS — E538 Deficiency of other specified B group vitamins: Secondary | ICD-10-CM

## 2022-12-11 DIAGNOSIS — F959 Tic disorder, unspecified: Secondary | ICD-10-CM | POA: Diagnosis not present

## 2022-12-11 DIAGNOSIS — R251 Tremor, unspecified: Secondary | ICD-10-CM

## 2022-12-11 DIAGNOSIS — G250 Essential tremor: Secondary | ICD-10-CM

## 2022-12-11 DIAGNOSIS — G609 Hereditary and idiopathic neuropathy, unspecified: Secondary | ICD-10-CM

## 2022-12-11 LAB — TSH: TSH: 2.05 u[IU]/mL (ref 0.35–5.50)

## 2022-12-11 LAB — B12 AND FOLATE PANEL
Folate: 10.9 ng/mL (ref >5.9)
Vitamin B-12: 412 pg/mL (ref 211–911)

## 2022-12-11 NOTE — Patient Instructions (Addendum)
We discussed that you:  Don't have Parkinsons Disease and have mild essential tremor.  We decided to hold on medication for this but discussed that your PCP may be able to change out your amlodipine for propranolol, a blood pressure medication that also helps tremor Will have labs today to rule our reversible causes of peripheral neuropathy (nerve damage in feet/legs) May have simple tics but decided to hold on treatment for that, as the treatment is often full of side effects Need some labwork  Your provider has requested that you have labwork completed today. The lab is located on the Second floor at [address], within the [facility] office. When you get off the elevator, turn right and go in the [facility] [address]; the first brown door on the left.  Tell the ladies behind the desk that you are there for lab work. If you are not called within 15 minutes please check with the front desk.   Once you complete your labs you are free to go. You will receive a call or message via MyChart with your lab results.    The physicians and staff at [facility] are committed to providing excellent care. You may receive a survey requesting feedback about your experience at our office. We strive to receive "very good" responses to the survey questions. If you feel that your experience would prevent you from giving the office a "very good " response, please contact our office to try to remedy the situation. We may be reached at [phone]. Thank you for taking the time out of your busy day to complete the survey.

## 2022-12-12 LAB — PROTEIN ELECTROPHORESIS, URINE REFLEX

## 2022-12-12 LAB — IMMUNOFIXATION, SERUM: IgG (Immunoglobin G), Serum: 1049 mg/dL (ref 603–1613)

## 2022-12-13 ENCOUNTER — Other Ambulatory Visit: Payer: Self-pay

## 2022-12-13 ENCOUNTER — Encounter: Payer: BC Managed Care – PPO | Admitting: *Deleted

## 2022-12-13 VITALS — BP 117/85 | HR 72 | Temp 97.3°F | Resp 16 | Ht 70.0 in | Wt 245.8 lb

## 2022-12-13 DIAGNOSIS — Z006 Encounter for examination for normal comparison and control in clinical research program: Secondary | ICD-10-CM

## 2022-12-13 NOTE — Progress Notes (Addendum)
Very nice gentleman seen today for possible V1-P.  Chart reviewed in detail and discussed with patient.  He has seen Dr. Cristal Deer and been sent for possible consideration of V1-P.   He previously was on another statin, and over time developed leg fatigue and pain, and more recently has been on pravastatin 3 times per week, and ezetamibe.  Most recent LDL was /dl.  Coronary CT showed 50-69% mid vessel disease.  Coronary calcification score was 120, 63rd percentile.  Currently stable on meds.  No prior cardiac event.  He does not smoke, does drink beer on occasion, and sees Dr. Arbutus Leas for essential tremor.    Alert oriented male in NAD BP 117/85  P 66 R 16 T 97.3  Lungs clear.  Cor regular No JVD or carotid bruit.  Abd soft.  Ext without edema  Protocol reviewed in detail.  Randomization process, and blinding also reviewed with patient.  He consents to undergo screening for V1-P.   Arturo Morton. Riley Kill, MD, [facility] Medical Director, [facility]

## 2022-12-13 NOTE — Research (Signed)
V1P Informed Consent  Protocol No. [id number] ICF Version number: 01.01.03     Subject met inclusion and exclusion criteria.  The informed consent form, study requirements and expectations were reviewed with the subject and questions and concerns were addressed prior to the signing of the consent form.  The subject verbalized understanding of the trial requirements.  The subject agreed to participate in the [name] trial and signed the informed consent on 13-Dec-2022 at 0910.  Informed consent was obtained prior to performance of any protocol-specific procedures. A copy of the signed informed consent was given to the subject and original copy was placed in the subject's medical record.  Subject [id number]   Date:13-Dec-2022 Male    Male  Age:62 Ethnicity: Hispanic/Latino   Non-Hispanic/Latino  Race: White  Black or African American  Asian  American [place] or [place] Native  Native Hawaiian or Other Pacific Islander   Inclusion Criteria Informed Consent Signed    Male 64-42 years of age Male 41-51 years of age    At increased risk for first MACE by one of the following:   a.) Coronary artery calcium (CAC) score ?100 Agatstin units   b.) Coronary artery stenosis ?20% but <50% in left main coronary artery or ? 20% but <70% in any major epicardial coronary artery   c.) High 10-year ASCVD risk ? 20%   d.) Intermediate 10-year ASCVD risk 7.5% - <20% with at least 2 of the following risk-enhancing factors   1. A first degree relative with history of premature ASCVD (males age <49;  females age <65)    2. History of premature menopause before age 15   3. History of preeclampsia, gestational hypertension, or gestational diabetes   4. [place] Asian ancestry   5. Chronic inflammatory disorders: -rheumatoid arthritis -systemic lupus erythematous -psoriasis covering ? 10% of body surface area -Chron's disease -ulcerative  colitis -HIV/AIDS        6. hsCRP ? /L documented in the 12 months prior to screening visit without underlying inflammatory conditions   7. Lipoprotein (a) ? 50 mg/dL or 324 nmol/L   8. eGFR <61mL/min/1.73 documented between screening and randomization visits   9. ABI <0.9 with no symptoms of intermittent claudication   10. Metabolic syndrome- Increased waist circumference Elevated triglycerides Elevated blood pressure Elevated glucose Low HDL-C (3 or more documented between screening and randomization visits)        If on background LLT, dose stable for at least 4 weeks prior to screening    LDL-C ? /dL but <401 mg/dL at screening visit     Exclusion Criteria History of Major ASCVD event defined as any: Acute coronary syndrome in past 12 months Prior myocardial infarction Prior ischemic stroke Symptomatic peripheral artery disease as evidenced by either intermittent claudication, previous revascularization, or amputation due to atherosclerotic disease at any time prior to randomization        N/A   History of, or planned ischemia-driven revascularization prior to randomization  N/A   Absence of coronary atherosclerosis on CT angiogram/invasive coronary angiogram in the 2 years prior to randomization  N/A   Coronary artery calcium score of 0 obtained in the 2 years prior to randomization  N/A   Active liver disease of hepatic dysfunction, defined as AST or ALT ? 3x ULN from central laboratory test as screening visit  N/A   Current or planned renal dialysis or transplantation  N/A   Previous (within 90 days prior to screening), current or planned treatment with  a mAb directed toward PCSK9  N/A   Previous exposure to inclisiran or other non-mAb PCSK9 within 2 years prior to randomization  N/A   Severe concomitant non-cardiovascular disease that is expected to reduce life expectancy to less  than 5 years  N/A   Use of other investigational drugs within 30 days or until expected pharmacodynamic effected has returned to baseline, whichever is longer  N/A   History of hypersensitivity to any of the study drugs or its excipients or to drugs of similar chemical class  N/A   Any surgical or medical condition which may place the participant at higher risk from his/her participation in the study, or is likely to prevent the participant from complying with the requirements of the study of completing the study    N/A   Unwillingness or inability to comply with study procedures (including adherence to study visits, fasting blood draws and compliance with study treatment regimens), and medication administration and schedule    N/A   Pregnant or nursing women  N/A   Women of child-bearing potential, unless they are using effective methods of contraception while taking study treatment  N/A      VP1 Screening Visit   Johnny Bradley D.O.B. Sep 27, 1960 MRN: [id number]  Subject number: [id number] Date: 24-Apr-202  Inclusion and exclusion criteria reviewed  Patient demography reviewed  Male    Male  Age:62 ETHNICITY Hispanic/Latino    Non-Hispanic/Latino  RACE White   Black or African American   Asian  American [place] or [place] Native   Native Hawaiian or Other Pacific Islander  Other   Smoking history reviewed   Never  Former  Current Every Day  Current Some Days  Cigarettes   Pipe   Cigars   e-Cigarettes     Past and current medical history reviewed  Surgical history reviewed  Prior or concomitant medications reviewed  Lipid lowering medications reviewed   Physical Exam performed by PI or SUB-I  Dr Riley Kill   Vital signs taken at :0942 Height:177.8 cm    JYNWGN:562 lbs./ 111.5 kg     Waist circumference: 116 cm   TIME: BP: PULSE:  0942 101/70 66  0951 114/102 71  0954  117/85 72  Mean BP: 110/85   Mean Pulse: 69  Labs collected at:0958 include: Serum pregnancy  Fasting Lipid Profile  Broad Clinical Chemistry  Hematology panel  Urinalysis   Genetic Consent obtained 

## 2022-12-14 LAB — PROTEIN ELECTROPHORESIS, SERUM
Albumin ELP: 3.8 g/dL (ref 3.8–4.8)
Alpha 1: 0.3 g/dL (ref 0.2–0.3)
Alpha 2: 0.6 g/dL (ref 0.5–0.9)
Beta 2: 0.4 g/dL (ref 0.2–0.5)
Beta Globulin: 0.5 g/dL (ref 0.4–0.6)
Gamma Globulin: 0.9 g/dL (ref 0.8–1.7)
Total Protein: 6.5 g/dL (ref 6.1–8.1)

## 2022-12-14 LAB — PROTEIN ELECTROPHORESIS, URINE REFLEX

## 2022-12-14 LAB — IMMUNOFIXATION, URINE

## 2022-12-15 ENCOUNTER — Encounter: Payer: Self-pay | Admitting: *Deleted

## 2022-12-15 DIAGNOSIS — Z006 Encounter for examination for normal comparison and control in clinical research program: Secondary | ICD-10-CM

## 2022-12-15 LAB — IMMUNOFIXATION, SERUM: IgM (Immunoglobulin M), Srm: 89 mg/dL (ref 20–172)

## 2022-12-15 NOTE — Research (Signed)
Spoke with Johnny Bradley to schedule next appointment. Scheduled for April 30 at 0830.

## 2022-12-15 NOTE — Research (Addendum)
Are there any labs that are clinically significant?  Yes []  OR No[x]   Is the patient eligible to continue enrollment in the study after screening visit?  Yes [x]   OR No[]    ACCESSION NO. [id number]                                             Page 2 of 2                        [id number]                   INVESTIGATOR: ([id number])                          PROTOCOL   [id number]                     Shawnie Pons M.D.                            INVESTIGATOR NO.: [id number]                     c/o [facility]                           PATIENT NUMBER: [id number]                     [facility]                  SUBJECT INITIALS NOT COLLECTED:                     [address]                          VISIT: SCRN                     [place],  [place] [address] Screening                   SPONSOR REPORT TO:                 COLLECTION TIME:09:58 DATE:13-Dec-2022                     Catrin Deck                      DATE RECEIVED IN LABORATORY: 14-Dec-2022                     c/o Theodosia Blender              DATE REPORTED BY LABORATORY: 14-Dec-2022                     [employer]                         SEX: M  BIRTHDATE:  21-Aug-1960    AGE: [place] Comment                     [facility]  [address] [place] [address]                                                                                        Ref. Ranges               Clinical    Comments                                                                          Significance                                                                            Yes*  No                    URINE MACRO PANEL                      Spec Grav      1.020        1.006-1.030                                 pH             6.5          5.0-8.0                                      Protein        Negative      Ref UEA:VWUJWJXB                           Glucose        Normal       Ref JYN:WGNFAO                               Ketones        Negative      Ref ZHY:QMVHQION                           Bilirubin      Negative      Ref GEX:BMWUXLKG                           Urobilin       Normal  Ref ZOX:WRUEAV                              Blood          Negative      Ref WUJ:WJXBJYNW                           Nitrite        Negative      Ref GNF:AOZHYQMV                           Leuk Est       Negative      Ref HQI:ONGEXBMW                         LIPID PANEL                      HDLC4          53           40-59 mg/dL                                 Triglycer      100          58-260 mg/dL                                 Cholest        170     L    175-298 mg/dL                                LDL Calc       97           83-210 mg/dL                                 VLDL-C         20           7-34 mg/dL                                 NON HDL CHOLESTEROL                      Non HDL ch     117          mg/dL No Ref Rng                           CREATININE                      Enz Creat      0.97         0.74-1.35 mg/dL               UXL2G                      Fast HbA1c     5.5          <  6.5%                          EGFR BY MDRD, ENZYMATIC                      GFR MDRD       78           mL/min/1.25m2                                                            No Ref Rng                      FASTING PLASMA GLUCOSE                      Gluc. Fast     114     H    70-100 mg/dL                     IS SUBJECT FASTING?                      Fasting?       Yes                                                    IS SUBJECT OF BLACK RACE?                      black?         No

## 2022-12-18 ENCOUNTER — Encounter: Payer: Self-pay | Admitting: *Deleted

## 2022-12-18 DIAGNOSIS — Z006 Encounter for examination for normal comparison and control in clinical research program: Secondary | ICD-10-CM

## 2022-12-18 LAB — PROTEIN ELECTROPHORESIS, URINE REFLEX: Alpha-2-Globulin, U: 0 %

## 2022-12-18 LAB — IMMUNOFIXATION, SERUM: IgA/Immunoglobulin A, Serum: 174 mg/dL (ref 61–437)

## 2022-12-18 NOTE — Research (Signed)
Spoke to Johnny Bradley to remind him of his appointment tomorrow at 0830, gave parking code and reminded to be NPO.

## 2022-12-19 ENCOUNTER — Encounter: Payer: BC Managed Care – PPO | Admitting: *Deleted

## 2022-12-19 VITALS — BP 118/74 | HR 77 | Resp 16

## 2022-12-19 DIAGNOSIS — Z006 Encounter for examination for normal comparison and control in clinical research program: Secondary | ICD-10-CM

## 2022-12-19 MED ORDER — STUDY - VICTORION-1 PREVENT - INCLISIRAN 300 MG/1.5 ML OR PLACEBO SQ INJECTION (PI-STUCKEY)
300.0000 mg | INJECTION | SUBCUTANEOUS | Status: DC
  Administered 2022-12-19: 300 mg via SUBCUTANEOUS
  Filled 2022-12-19: qty 1.5

## 2022-12-19 NOTE — Research (Signed)
   SUBJECT ID: [id number] Visit: V1          Prior or concomitant medications reviewed [x]   Lipid lowering medications reviewed [x]   Surgical history reviewed [x]   TIME: BP: PULSE:  0835 112/70 73  0840 102/71 70  0849 118/74 77  Mean BP: 110/72  Mean Pulse: 73  Labs collected at: 0856 Fasting Lipid Profile [x]  Fasting Lp(a) [x]  Liver function tests [x]  Exploratory biomarkers []   AE/SAE assessment completed [x]   Study drug administered [x]   Endpoint assessment completed [x]   Patient seen in clinic for V-1 Prevent study visit V1  19-Dec-2022. Patient denies abdominal pain, nausea/vomiting. Denies any ED or urgent care visits since last seen in research clinic. Denies signs/symptoms of pancreatitis. Denies other pain. Vital signs obtained at 0849 while patient was in seated position. Mean seated BP 110/72. Mean seated pulse rate 73. Labs drawn at 0856. Past and current medical history reviewed. Surgical history reviewed. Medications reviewed with patient during visit, denies changes.  Injection given in right lower abd at 0909 tol well Kit number [id number]. Scheduled next visit for Aug 5 at 0830.

## 2022-12-21 NOTE — Research (Addendum)
Are there any labs that are clinically significant?  Yes []  OR No[x]   ACCESSION NO. [id number]                                             Page 1 of 1                        [id number]                   INVESTIGATOR: ([id number])                          PROTOCOL   [id number]                     Shawnie Pons M.D.                            INVESTIGATOR NO.: [id number]                     c/o Blair Promise                           PATIENT NUMBER: [id number]                     [facility]                  SUBJECT INITIALS NOT COLLECTED:                     [address]                          VISIT: V1                     [place],  [place] [address] Baseline V1                   SPONSOR REPORT TO:                 COLLECTION TIME:08:56 DATE:19-Dec-2022                     Catrin Deck                      DATE RECEIVED IN LABORATORY: 20-Dec-2022                     c/o Theodosia Blender              DATE REPORTED BY LABORATORY: 20-Dec-2022                     [employer]                         SEX: M  BIRTHDATE:  21-Aug-1960    AGE: 62y                     [facility] [address], [place] [address]  Ref. Ranges               Clinical    Comments                                                                          Significance                                                                            Yes*  No                    LIPID PANEL                      HDLC4          53           40-59 mg/dL                                 Triglycer      93           58-260 mg/dL                                 Cholest        168     L    175-298 mg/dL                                LDL Calc       96           83-210 mg/dL                                 VLDL-C         19           7-34 mg/dL                                 NON HDL CHOLESTEROL                       Non HDL ch     115          mg/dL No Ref Rng      HS-CRP                      CRPHS          0.70    H    0.00-0.50 mg/dL          IS SUBJECT FASTING?  Fasting?       Yes

## 2023-03-26 ENCOUNTER — Encounter: Payer: BC Managed Care – PPO | Admitting: *Deleted

## 2023-03-26 ENCOUNTER — Other Ambulatory Visit: Payer: Self-pay

## 2023-03-26 DIAGNOSIS — Z006 Encounter for examination for normal comparison and control in clinical research program: Secondary | ICD-10-CM

## 2023-03-26 MED ORDER — STUDY - VICTORION-1 PREVENT - INCLISIRAN 300 MG/1.5 ML OR PLACEBO SQ INJECTION (PI-STUCKEY)
300.0000 mg | INJECTION | SUBCUTANEOUS | Status: DC
  Administered 2023-03-26: 300 mg via SUBCUTANEOUS
  Filled 2023-03-26: qty 1.5

## 2023-03-26 NOTE — Research (Addendum)
  Takahiro Baldry V1P-V2 26-Mar-2023      V1P Informed Consent- Re-consent   Protocol No. [id number] ICF Version number: 01.01.04     Subject met inclusion and exclusion criteria.  The informed consent form, study requirements and expectations were reviewed with the subject and questions and concerns were addressed prior to the signing of the consent form.  The subject verbalized understanding of the trial requirements.  The subject agreed to participate in the [name] trial and signed the informed consent on 26-Mar-2023 at 0835  DATE: 26-Mar-2023  SUBJECT ID: [id number]   Visit: V2        Day: 90          Prior or concomitant medications reviewed [x]   Lipid lowering medications reviewed [x]   Surgical history reviewed [x]   TIME: BP: PULSE:  0840 125/83 73  0844 130/86 76  0846 119/82 78  Mean BP: 124/83  Mean Pulse: 75  Labs collected at: 0851 Fasting Lipid Profile [x]  Fasting Lp(a) [x]  Liver function tests []  Exploratory biomarkers []   AE/SAE assessment completed [x]   Study drug administered [x]   Endpoint assessment completed [x]    Mr Obryon is here for V1P visit 2. He reports no abd pain, or other s&s of pancreatitis, no visits to the Ed or Urgent care, and no med changes since last seen. To clarify he is not able to take higher dose or more frequent statins due to muscle pain, so he takes statin 3 times a week. Injection was given in right lower abd tol well at 0902. Kit number [id number]. Next visit scheduled for Sep 14, 2023 at 0830.   Current Outpatient Medications:    amLODipine (NORVASC) 5 MG tablet, TAKE 1 TABLET BY MOUTH DAILY; **MUST CALL MD FOR APPOINTMENT FOR FUTURE REFILLS, Disp: 90 tablet, Rfl: 3   aspirin EC 81 MG tablet, Take 81 mg by mouth daily. Swallow whole., Disp: , Rfl:    Coenzyme Q10-Red Yeast Rice (CO Q-10 PLUS RED YEAST RICE PO), Take 2 tablets by mouth daily., Disp: , Rfl:    diazepam (VALIUM) 5 MG tablet, Take 1 tablet  (5 mg total) by mouth 30 (thirty) minutes before procedure for 1 dose., Disp: 5 tablet, Rfl: 0   ezetimibe (ZETIA) 10 MG tablet, Take 1 tablet (10 mg total) by mouth daily., Disp: 90 tablet, Rfl: 3   Omega-3 Fatty Acids (FISH OIL) 300 MG CAPS, Take by mouth., Disp: , Rfl:    pravastatin (PRAVACHOL) 20 MG tablet, Take 20 mg by mouth. Every Monday, Wednesday, and Friday, Disp: , Rfl:    Study - VICTORION-1 PREVENT - inclisiran 300 mg/1.14mL or placebo SQ injection (PI-Stuckey), Inject 300 mg into the skin every 6 (six) months. For Investigational Use Only. Inject subcutaneously into the abdomen, upper arm or thigh. Do not inject into areas of active skin disease,such as sun burns, skin rashes, inflammation, or skin infections., Disp: , Rfl:   Current Facility-Administered Medications:    Study - VICTORION-1 PREVENT - inclisiran 300 mg/1.65mL or placebo SQ injection (PI-Stuckey), 300 mg, Subcutaneous, Q6 months, , 300 mg at 03/26/23 [phone]

## 2023-04-02 ENCOUNTER — Encounter (HOSPITAL_BASED_OUTPATIENT_CLINIC_OR_DEPARTMENT_OTHER): Payer: Self-pay | Admitting: Cardiology

## 2023-04-02 ENCOUNTER — Ambulatory Visit (INDEPENDENT_AMBULATORY_CARE_PROVIDER_SITE_OTHER): Payer: BC Managed Care – PPO | Admitting: Cardiology

## 2023-04-02 VITALS — BP 114/82 | HR 80 | Ht 70.0 in | Wt 251.0 lb

## 2023-04-02 DIAGNOSIS — K760 Fatty (change of) liver, not elsewhere classified: Secondary | ICD-10-CM

## 2023-04-02 DIAGNOSIS — M791 Myalgia, unspecified site: Secondary | ICD-10-CM

## 2023-04-02 DIAGNOSIS — E7800 Pure hypercholesterolemia, unspecified: Secondary | ICD-10-CM

## 2023-04-02 DIAGNOSIS — I2510 Atherosclerotic heart disease of native coronary artery without angina pectoris: Secondary | ICD-10-CM

## 2023-04-02 DIAGNOSIS — Z7189 Other specified counseling: Secondary | ICD-10-CM

## 2023-04-02 DIAGNOSIS — T466X5A Adverse effect of antihyperlipidemic and antiarteriosclerotic drugs, initial encounter: Secondary | ICD-10-CM

## 2023-04-02 HISTORY — DX: Adverse effect of antihyperlipidemic and antiarteriosclerotic drugs, initial encounter: T46.6X5A

## 2023-04-02 NOTE — Patient Instructions (Signed)
Medication Instructions:  None   *If you need a refill on your cardiac medications before your next appointment, please call your pharmacy*   Lab Work: None   Testing/Procedures: N/A   Follow-Up: At [facility], you and your health needs are our priority.  As part of our continuing mission to provide you with exceptional heart care, we have created designated Provider Care Teams.  These Care Teams include your primary Cardiologist (physician) and Advanced Practice Providers (APPs -  Physician Assistants and Nurse Practitioners) who all work together to provide you with the care you need, when you need it.  We recommend signing up for the patient portal called "MyChart".  Sign up information is provided on this After Visit Summary.  MyChart is used to connect with patients for Virtual Visits (Telemedicine).  Patients are able to view lab/test results, encounter notes, upcoming appointments, etc.  Non-urgent messages can be sent to your provider as well.   To learn more about what you can do with MyChart, go to [email or web address].    Your next appointment:   6 month(s)  Provider:   Jodelle Red, MD    Other Instructions None

## 2023-04-02 NOTE — Progress Notes (Signed)
Cardiology Office Note:  .   Date:  04/02/2023  ID:  Johnny Bradley, DOB [birth year], MRN [id number] PCP: Glori Luis, MD  [facility] Providers Cardiologist:  Jodelle Red, MD {  History of Present Illness: .   Johnny Bradley is a 62 y.o. male ith a hx of CAD by CT, hypertension, hyperlipidemia, arthritis, and sleep apnea, here for follow up. I initially met him 08/04/22 for chest pain evaluation   CV risk factors:  -Alcohol: no hard liquor or wine. Only beer on the weekends. He drinks 8 to 10 Budweiser beers at a time 2-3 days/week. He does not drink hard liquor or wine. And he always is drinking either water or diet/ diet caffeine free soda.  -Tobacco: Not a smoker. He has smoked no more than 12 cigars in his life.  -Exercise level: Before 2011, he was able to run multiple miles a day or be on the elliptical for 45 minutes with no issues. But now he gets shortness of breath quickly, especially when using stairs. He is not very active anymore. He had a total knee replacement in August.  -Family history:  His father had pacemaker and passed away in his sleep at age 30 though nobody else in his family has had any heart issues.    CT coronary 07/2022  showed CAD with normal FFR.  Today: Doing well overall. Recently travelled to the [place] did well. Taking pravastatin MWF, has myalgia if he takes it more frequently.   Working on weight loss, has had success. Watching carbs.   ROS: Denies chest pain, shortness of breath at rest or with normal exertion. No PND, orthopnea, LE edema or unexpected weight gain. No syncope or palpitations. ROS otherwise negative except as noted.   Studies Reviewed: Marland Kitchen    EKG:  EKG Interpretation Date/Time:  Monday April 02 2023 08:50:46 EDT Ventricular Rate:  80 PR Interval:  158 QRS Duration:  114 QT Interval:  394 QTC Calculation: 454 R Axis:   -14  Text Interpretation: Normal sinus rhythm Normal ECG When compared with ECG  of 28-Jun-2022 13:50, No significant change was found Confirmed by Jodelle Red [phone]) on 04/02/2023 9:03:43 AM    Physical Exam:   VS:  BP 114/82 (BP Location: Left Arm, Patient Position: Sitting, Cuff Size: Large)   Pulse 80   Ht 5\' 10"  (1.778 m)   Wt 251 lb (113.9 kg)   BMI 36.01 kg/m    Wt Readings from Last 3 Encounters:  04/02/23 251 lb (113.9 kg)  03/26/23 250 lb (113.4 kg)  12/13/22 245 lb 13 oz (111.5 kg)    GEN: Well nourished, well developed in no acute distress HEENT: Normal, moist mucous membranes NECK: No JVD CARDIAC: regular rhythm, normal S1 and S2, no rubs or gallops. No murmur. VASCULAR: Radial and DP pulses 2+ bilaterally. No carotid bruits RESPIRATORY:  Clear to auscultation without rales, wheezing or rhonchi  ABDOMEN: Soft, non-tender, non-distended MUSCULOSKELETAL:  Ambulates independently SKIN: Warm and dry, no edema NEUROLOGIC:  Alert and oriented x 3. No focal neuro deficits noted. PSYCHIATRIC:  Normal affect    ASSESSMENT AND PLAN: .   Nonobstructive CAD Mixed hyperlipidemia Hepatic steatosis -enrolled in V1P (inclisiran vs placebo) -he was started on zetia after the scan. Cannot tolerate higher dose/intensity statin due to myalgia -continue aspirin, pravastatin three times/week, and zetia -BMI 36, working on weight loss -planned for labs next month with his PCP  CV risk counseling and prevention -recommend heart healthy/Mediterranean diet,  with whole grains, fruits, vegetable, fish, lean meats, nuts, and olive oil. Limit salt. -recommend moderate walking, 3-5 times/week for 30-50 minutes each session. Aim for at least 150 minutes.week. Goal should be pace of 3 miles/hours, or walking 1.5 miles in 30 minutes -recommend avoidance of tobacco products. Avoid excess alcohol.  Dispo: 6 mos or sooner as needed  Signed, Jodelle Red, MD   Jodelle Red, MD, PhD, [facility] [place]  [facility]  [place]  [facility] at [facility] [address], [address] [place], [place] [address] [phone]

## 2023-04-05 ENCOUNTER — Encounter (INDEPENDENT_AMBULATORY_CARE_PROVIDER_SITE_OTHER): Payer: Self-pay

## 2023-05-11 ENCOUNTER — Ambulatory Visit (INDEPENDENT_AMBULATORY_CARE_PROVIDER_SITE_OTHER): Payer: BC Managed Care – PPO | Admitting: Family Medicine

## 2023-05-11 ENCOUNTER — Encounter: Payer: Self-pay | Admitting: Family Medicine

## 2023-05-11 VITALS — BP 138/86 | HR 73 | Temp 97.9°F | Ht 70.0 in | Wt 253.0 lb

## 2023-05-11 DIAGNOSIS — Z6835 Body mass index (BMI) 35.0-35.9, adult: Secondary | ICD-10-CM

## 2023-05-11 DIAGNOSIS — Z23 Encounter for immunization: Secondary | ICD-10-CM

## 2023-05-11 DIAGNOSIS — I10 Essential (primary) hypertension: Secondary | ICD-10-CM

## 2023-05-11 DIAGNOSIS — E782 Mixed hyperlipidemia: Secondary | ICD-10-CM

## 2023-05-11 LAB — LIPID PANEL
Cholesterol: 153 mg/dL (ref 0–200)
HDL: 48.1 mg/dL (ref >39.00)
LDL Cholesterol: 84 mg/dL (ref 0–99)
NonHDL: 104.66
Total CHOL/HDL Ratio: 3
Triglycerides: 103 mg/dL (ref 0.0–149.0)
VLDL: 20.6 mg/dL (ref 0.0–40.0)

## 2023-05-11 LAB — COMPREHENSIVE METABOLIC PANEL
ALT: 19 U/L (ref 0–53)
AST: 19 U/L (ref 0–37)
Albumin: 4.2 g/dL (ref 3.5–5.2)
Alkaline Phosphatase: 44 U/L (ref 39–117)
BUN: 13 mg/dL (ref 6–23)
CO2: 29 mEq/L (ref 19–32)
Calcium: 9.1 mg/dL (ref 8.4–10.5)
Chloride: 104 mEq/L (ref 96–112)
Creatinine, Ser: 0.94 mg/dL (ref 0.40–1.50)
GFR: 85.91 mL/min (ref >60.00)
Glucose, Bld: 105 mg/dL — ABNORMAL HIGH (ref 70–99)
Potassium: 4 mEq/L (ref 3.5–5.1)
Sodium: 140 mEq/L (ref 135–145)
Total Bilirubin: 1 mg/dL (ref 0.2–1.2)
Total Protein: 6.6 g/dL (ref 6.0–8.3)

## 2023-05-11 NOTE — Assessment & Plan Note (Signed)
Chronic issue.  Patient will continue pravastatin Monday, Wednesday, and Friday.  He will also continue Zetia 10 mg daily.  Will check labs today and consider alteration of his regimen depending on his results.

## 2023-05-11 NOTE — Assessment & Plan Note (Signed)
Chronic issue.  Had been adequately controlled.  A little elevated today.  He will continue amlodipine 5 mg daily and start checking his blood pressure at home.  He will let us know if it is persistently elevated at home.

## 2023-05-11 NOTE — Addendum Note (Signed)
Addended by: Prince Solian A on: 05/11/2023 09:56 AM   Modules accepted: Orders

## 2023-05-11 NOTE — Assessment & Plan Note (Signed)
Chronic issue.  Patient had lost quite a bit of weight before traveling out of the country.  I encouraged him to continue to work on diet and exercise.

## 2023-05-11 NOTE — Progress Notes (Signed)
Marikay Alar, MD Phone: [phone]  Johnny Bradley is a 62 y.o. male who presents today for f/u.  HYPERTENSION Disease Monitoring Home BP Monitoring not checking at home Chest pain- no    Dyspnea- no change to chronic dyspnea Medications Compliance-  taking amlodipine.  Edema- no BMET    Component Value Date/Time   NA 140 08/23/2022 1559   K 4.3 08/23/2022 1559   CL 100 08/23/2022 1559   CO2 25 08/23/2022 1559   GLUCOSE 93 08/23/2022 1559   GLUCOSE 108 (H) 06/28/2022 1404   BUN 11 08/23/2022 1559   CREATININE 0.98 08/23/2022 1559   CALCIUM 9.4 08/23/2022 1559   GFRNONAA >60 06/28/2022 1404   Hyperlipidemia: Patient is in a clinical trial for this.  He saw cardiology and they recommended he go on the statin 7 days a week.  He has only been taking it Monday, Wednesday, and Friday.  He is also on Zetia.  No myalgias on this current regimen.  Obesity: Patient has been walking 50,000 steps a week.  He has cut down on bread intake and carbohydrates.  He got his weight down to 240 pounds though then he traveled to the [place] and his weight is up since then.  Social History   Tobacco Use  Smoking Status Never  Smokeless Tobacco Never    Current Outpatient Medications on File Prior to Visit  Medication Sig Dispense Refill   amLODipine (NORVASC) 5 MG tablet TAKE 1 TABLET BY MOUTH DAILY; **MUST CALL MD FOR APPOINTMENT FOR FUTURE REFILLS 90 tablet 3   aspirin EC 81 MG tablet Take 81 mg by mouth daily. Swallow whole.     Coenzyme Q10-Red Yeast Rice (CO Q-10 PLUS RED YEAST RICE PO) Take 2 tablets by mouth daily.     ezetimibe (ZETIA) 10 MG tablet Take 1 tablet (10 mg total) by mouth daily. 90 tablet 3   Omega-3 Fatty Acids (FISH OIL) 300 MG CAPS Take by mouth.     pravastatin (PRAVACHOL) 20 MG tablet Take 20 mg by mouth. Every Monday, Wednesday, and Friday     Study - VICTORION-1 PREVENT - inclisiran 300 mg/1.75mL or placebo SQ injection (PI-Stuckey) Inject 300 mg into the  skin every 6 (six) months. For Investigational Use Only. Inject subcutaneously into the abdomen, upper arm or thigh. Do not inject into areas of active skin disease,such as sun burns, skin rashes, inflammation, or skin infections.     Current Facility-Administered Medications on File Prior to Visit  Medication Dose Route Frequency Provider Last Rate Last Admin   Study - VICTORION-1 PREVENT - inclisiran 300 mg/1.35mL or placebo SQ injection (PI-Stuckey)  300 mg Subcutaneous Q6 months    300 mg at 03/26/23 0902     ROS see history of present illness  Objective  Physical Exam Vitals:   05/11/23 0931  BP: 138/86  Pulse: 73  Temp: 97.9 F (36.6 C)  SpO2: 97%    BP Readings from Last 3 Encounters:  05/11/23 138/86  04/02/23 114/82  03/26/23 119/82   Wt Readings from Last 3 Encounters:  05/11/23 253 lb (114.8 kg)  04/02/23 251 lb (113.9 kg)  03/26/23 250 lb (113.4 kg)    Physical Exam Constitutional:      General: He is not in acute distress.    Appearance: He is not diaphoretic.  Cardiovascular:     Rate and Rhythm: Normal rate and regular rhythm.     Heart sounds: Normal heart sounds.  Pulmonary:     Effort:  Pulmonary effort is normal.     Breath sounds: Normal breath sounds.  Musculoskeletal:     Right lower leg: No edema.     Left lower leg: No edema.  Skin:    General: Skin is warm and dry.  Neurological:     Mental Status: He is alert.      Assessment/Plan: Please see individual problem list.  Primary hypertension Assessment & Plan: Chronic issue.  Had been adequately controlled.  A little elevated today.  He will continue amlodipine 5 mg daily and start checking his blood pressure at home.  He will let us know if it is persistently elevated at home.   Mixed hyperlipidemia Assessment & Plan: Chronic issue.  Patient will continue pravastatin Monday, Wednesday, and Friday.  He will also continue Zetia 10 mg daily.  Will check labs today and consider  alteration of his regimen depending on his results.  Orders: -     Lipid panel -     Comprehensive metabolic panel  Class 2 severe obesity due to excess calories with serious comorbidity and body mass index (BMI) of 35.0 to 35.9 in adult [facility]) Assessment & Plan: Chronic issue.  Patient had lost quite a bit of weight before traveling out of the country.  I encouraged him to continue to work on diet and exercise.     Return in about 6 months (around 11/08/2023).   Marikay Alar, MD [facility]

## 2023-05-15 ENCOUNTER — Other Ambulatory Visit (INDEPENDENT_AMBULATORY_CARE_PROVIDER_SITE_OTHER): Payer: BC Managed Care – PPO

## 2023-05-15 ENCOUNTER — Ambulatory Visit (INDEPENDENT_AMBULATORY_CARE_PROVIDER_SITE_OTHER): Payer: BC Managed Care – PPO | Admitting: Orthopaedic Surgery

## 2023-05-15 DIAGNOSIS — Z96651 Presence of right artificial knee joint: Secondary | ICD-10-CM

## 2023-05-15 NOTE — Progress Notes (Signed)
Office Visit Note   Patient: Johnny Bradley           Date of Birth: 1961-04-06           MRN: [id number] Visit Date: 05/15/2023              Requested by: Glori Luis, MD [address] [place],  [place] [address] PCP: Glori Luis, MD   Assessment & Plan: Visit Diagnoses:  1. Status post total right knee replacement     Plan: Adetokunbo is 1 year postop from the right knee replacement.  He has resumed his activities without any problems.  At this point will see him back in another year for recheck and repeat x-rays.  Dental prophylaxis reinforced.  Follow-Up Instructions: Return in about 1 year (around 05/14/2024).   Orders:  Orders Placed This Encounter  Procedures   XR Knee 1-2 Views Right   No orders of the defined types were placed in this encounter.     Procedures: No procedures performed   Clinical Data: No additional findings.   Subjective: Chief Complaint  Patient presents with   Right Knee - Follow-up    HPI Salah returns today for a 1 year postop visit from right total knee replacement.  He is doing well and getting around okay.  Reports no pain. Review of Systems   Objective: Vital Signs: There were no vitals taken for this visit.  Physical Exam  Ortho Exam Examination of the right knee shows fully healed surgical scar.  Excellent range of motion.  Collaterals are stable. Specialty Comments:  No specialty comments available.  Imaging: XR Knee 1-2 Views Right  Result Date: 05/15/2023 X-rays of the right knee demonstrate a stable right total knee replacement in good alignment     PMFS History: Patient Active Problem List   Diagnosis Date Noted   Myalgia due to statin 04/02/2023   Nonocclusive coronary atherosclerosis of native coronary artery 04/02/2023   Encounter for general adult medical examination with abnormal findings 11/07/2022   Adenomatous polyp of colon 08/07/2022   Chest pain on breathing 07/13/2022   Chest  pain 07/06/2022   Liver lesion 07/06/2022   Status post total left knee replacement 04/17/2022   Primary osteoarthritis of right knee 11/15/2021   Right shoulder pain 11/01/2021   Abdominal discomfort 11/01/2021   Bilateral knee pain 09/15/2020   OSA (obstructive sleep apnea) 02/03/2020   Chronic knee pain 02/03/2020   Family history of angiosarcoma 04/15/2019   Prediabetes 04/14/2019   HLD (hyperlipidemia) 10/02/2018   Subcutaneous nodule of right hand 10/02/2018   Skin lesion 05/29/2018   Hypertension 12/03/2017   Encounter for screening colonoscopy 05/25/2017   Obesity 05/25/2017   Tremor 05/25/2017   Right low back pain 12/19/2014   Past Medical History:  Diagnosis Date   Arthritis 02/03/2020   Back pain 12/09/2014   Hyperlipidemia 12/15/2014   Hypertension 12/03/2017   Obesity 01/20/2020   Shoulder pain 12/09/2014   Left   Sleep apnea 01/20/2020   not currently on cpap    Family History  Problem Relation Age of Onset   Alcohol abuse Mother    Heart disease Father    Early death Brother 19       angiosarcoma   Stroke Paternal Grandmother    Sleep apnea Son    Psoriasis Son    Panic disorder Son     Past Surgical History:  Procedure Laterality Date   COLONOSCOPY WITH PROPOFOL N/A 08/07/2022  Procedure: COLONOSCOPY WITH PROPOFOL;  Surgeon: Wyline Mood, MD;  Location: [facility] ENDOSCOPY;  Service: Gastroenterology;  Laterality: N/A;   JOINT REPLACEMENT     KNEE ARTHROSCOPY Right    [facility]   PILONIDAL CYST EXCISION  1980   TOTAL KNEE ARTHROPLASTY Right 04/17/2022   Procedure: RIGHT TOTAL KNEE ARTHROPLASTY;  Surgeon: Tarry Kos, MD;  Location: [facility] OR;  Service: Orthopedics;  Laterality: Right;   WISDOM TOOTH EXTRACTION     Social History   Occupational History   Not on file  Tobacco Use   Smoking status: Never   Smokeless tobacco: Never  Vaping Use   Vaping status: Never Used  Substance and Sexual Activity   Alcohol use: Yes    Alcohol/week: 3.0  standard drinks of alcohol    Types: 3 Cans of beer per week    Comment: On weekends- [address] 62    Drug use: No   Sexual activity: Yes    Partners: Female    Comment: Wife

## 2023-08-14 IMAGING — DX DG SHOULDER 2+V*R*
3 series · 3 of 3 positions shown · non-contrast
Comparison: None.

CLINICAL DATA: Ongoing right shoulder pain for 2-3 months. Suspect
rotator cuff impingement.

EXAM:
RIGHT SHOULDER - 2+ VIEW

[shoulder (grashey) ap]
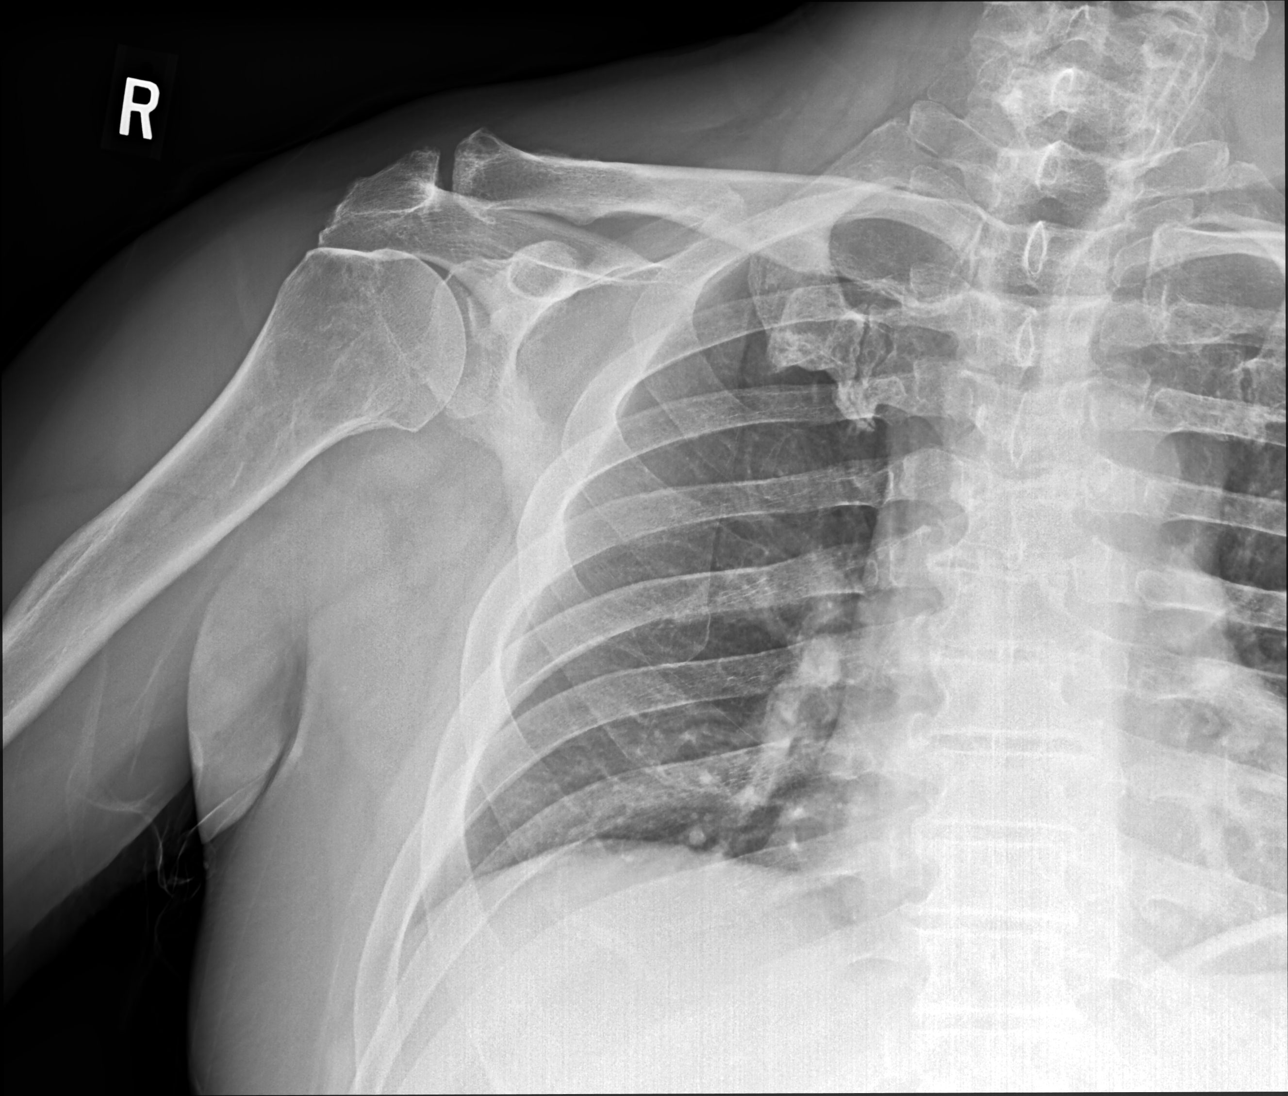

[shoulder y view]
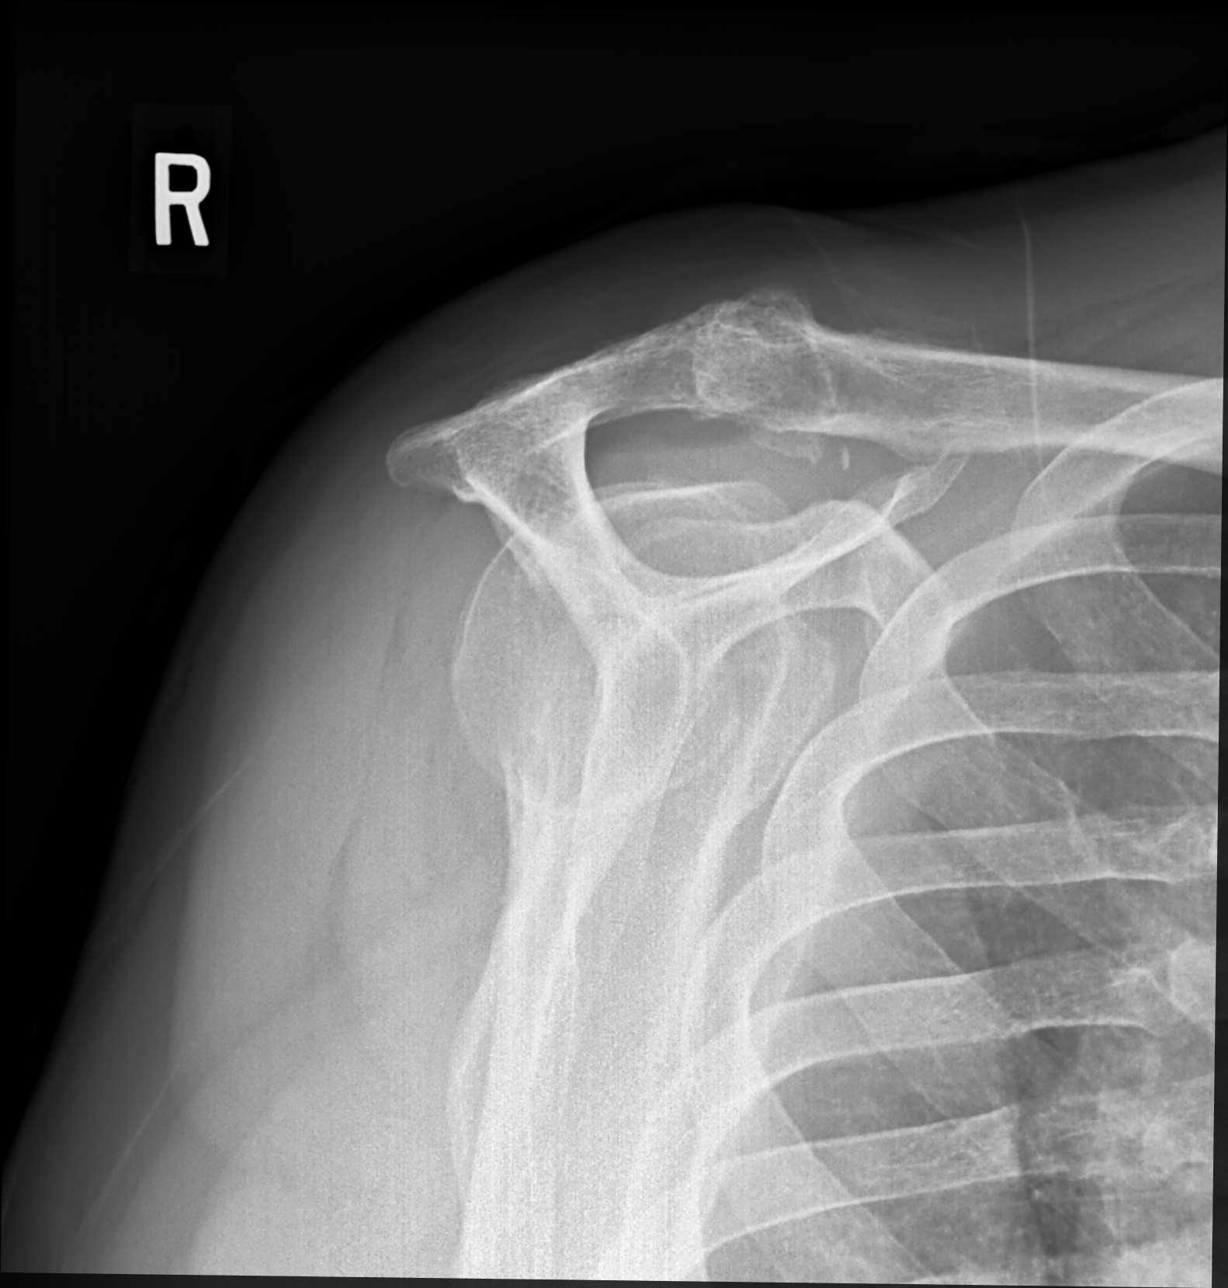

[shoulder axillary pa]
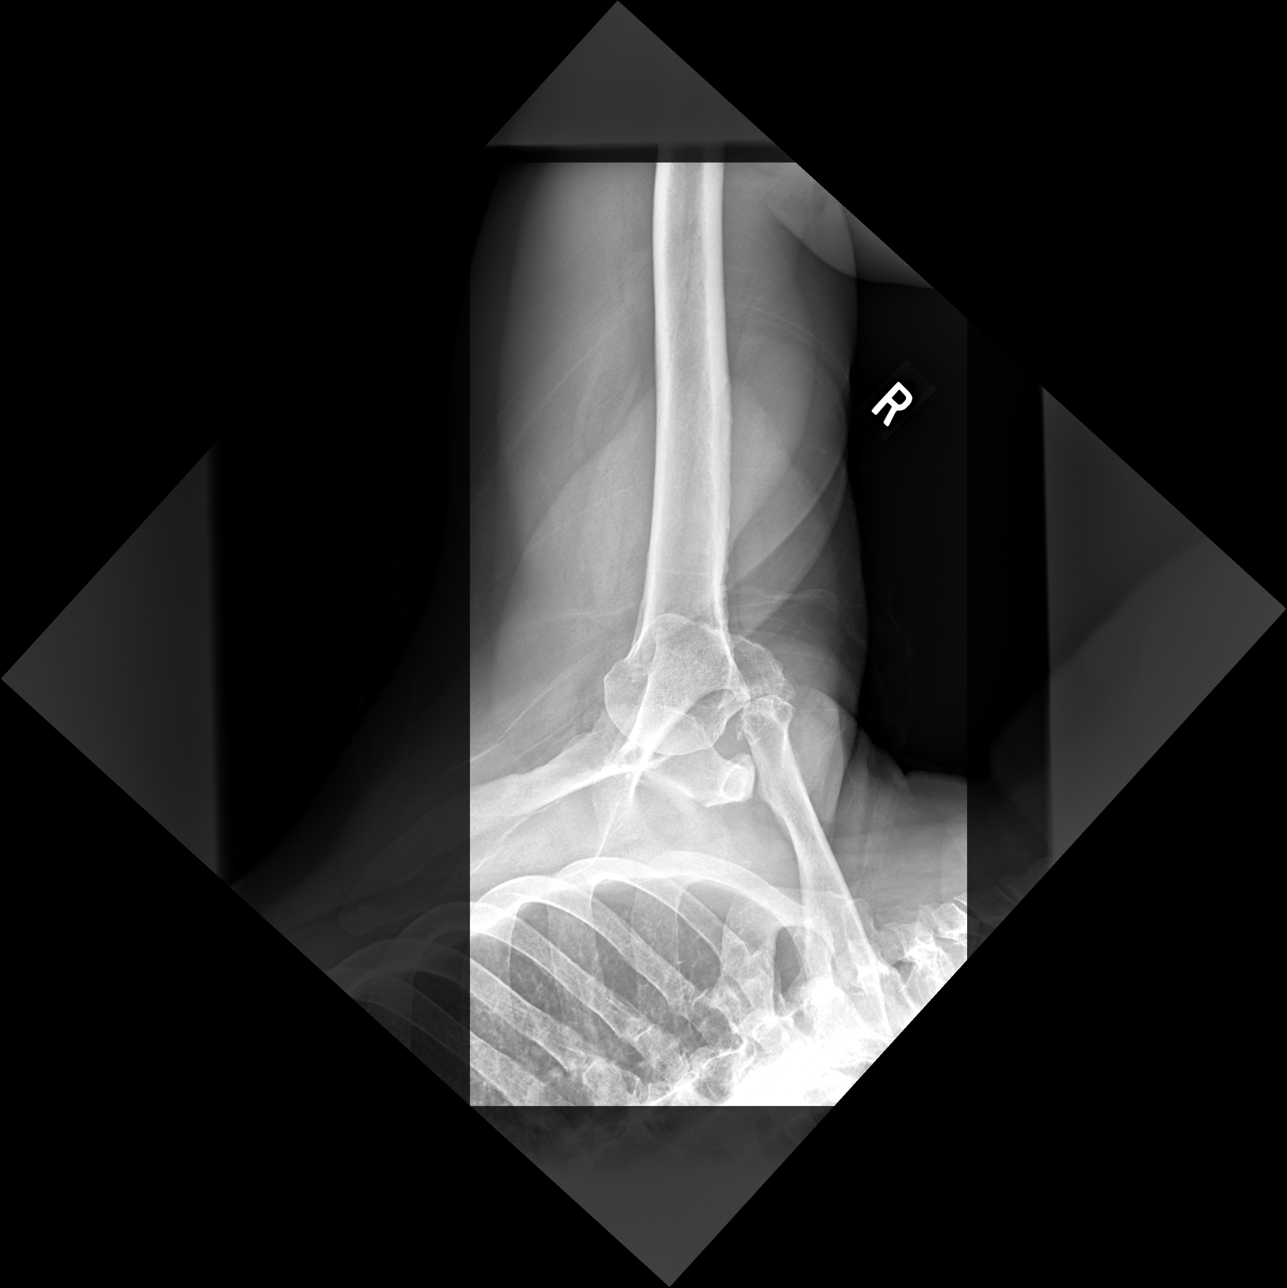

[3 of 3 positions shown; findings below may reference images not displayed]

FINDINGS: Mild acromioclavicular joint space narrowing, subchondral cystic
changes, and peripheral osteophytosis degenerative change.
Mild-to-moderate lateral downsloping of the acromion with
mild-to-moderate subacromial spurring. No acute fracture or
dislocation.
IMPRESSION: :
IMPRESSION: 1. Mild acromioclavicular osteoarthritis.
2. Mild-to-moderate distal lateral acromial downsloping and
subacromial spurring.

## 2023-08-16 ENCOUNTER — Other Ambulatory Visit (HOSPITAL_BASED_OUTPATIENT_CLINIC_OR_DEPARTMENT_OTHER): Payer: Self-pay | Admitting: Family

## 2023-09-14 ENCOUNTER — Encounter: Payer: BC Managed Care – PPO | Admitting: *Deleted

## 2023-09-14 DIAGNOSIS — Z006 Encounter for examination for normal comparison and control in clinical research program: Secondary | ICD-10-CM

## 2023-09-14 MED ORDER — STUDY - VICTORION-1 PREVENT - INCLISIRAN 300 MG/1.5 ML OR PLACEBO SQ INJECTION (PI-STUCKEY)
300.0000 mg | INJECTION | SUBCUTANEOUS | Status: AC
  Administered 2023-09-14: 300 mg via SUBCUTANEOUS
  Filled 2023-09-14: qty 1.5

## 2023-09-14 NOTE — Research (Addendum)
DATE: 14-Sep-2023  SUBJECT IR:[id number]   Visit:3         Prior or concomitant medications reviewed [x]   Lipid lowering medications reviewed [x]   Surgical history reviewed [x]   TIME: BP: PULSE:  0829 115/72 71  0834 107/73 71  0836 109/79 71  Mean BP: 110/74  Mean Pulse: 71  Labs collected at: 0843 Fasting Lipid Profile [x]  Fasting Lp(a) []  Liver function tests []  Exploratory biomarkers []   AE/SAE assessment completed [x]   Study drug administered [x]   Endpoint assessment completed [x]   Johnny Bradley is here for  Visit 3 of V1-P. He reports no abd pain, no visits to the Ed or Urgent care, but does report an increase in his Pravastatin to 5 times a week (from 3 times a week). Injection was given in left lower abd tol well. Kit number [id number]. Scheduled next appointment for 12-Mar-2024.     Current Outpatient Medications:    amLODipine (NORVASC) 5 MG tablet, TAKE 1 TABLET BY MOUTH DAILY; **MUST CALL MD FOR APPOINTMENT FOR FUTURE REFILLS, Disp: 90 tablet, Rfl: 3   aspirin EC 81 MG tablet, Take 81 mg by mouth daily. Swallow whole., Disp: , Rfl:    ezetimibe (ZETIA) 10 MG tablet, TAKE 1 TABLET BY MOUTH DAILY, Disp: 90 tablet, Rfl: 3   Omega-3 Fatty Acids (FISH OIL) 300 MG CAPS, Take by mouth., Disp: , Rfl:    pravastatin (PRAVACHOL) 20 MG tablet, Take 20 mg by mouth. Every Monday, Wednesday, and Friday, Disp: , Rfl:    Study - VICTORION-1 PREVENT - inclisiran 300 mg/1.74mL or placebo SQ injection (PI-Stuckey), Inject 300 mg into the skin every 6 (six) months. For Investigational Use Only. Inject subcutaneously into the abdomen, upper arm or thigh. Do not inject into areas of active skin disease,such as sun burns, skin rashes, inflammation, or skin infections., Disp: , Rfl:    Coenzyme Q10-Red Yeast Rice (CO Q-10 PLUS RED YEAST RICE PO), Take 2 tablets by mouth daily. (Patient not taking: Reported on 09/14/2023), Disp: , Rfl:   Current Facility-Administered Medications:    Study -  VICTORION-1 PREVENT - inclisiran 300 mg/1.2mL or placebo SQ injection (PI-Stuckey), 300 mg, Subcutaneous, Q6 months, , 300 mg at 09/14/23 [phone]

## 2023-10-31 ENCOUNTER — Telehealth: Payer: Self-pay | Admitting: Family Medicine

## 2023-10-31 ENCOUNTER — Other Ambulatory Visit (HOSPITAL_BASED_OUTPATIENT_CLINIC_OR_DEPARTMENT_OTHER): Payer: Self-pay | Admitting: Family

## 2023-10-31 NOTE — Telephone Encounter (Signed)
 Dr Clent Ridges is leaving the practice and your Transfer of Care needs to be rescheduled with another provider. Please call the office to schedule a Transfer of Care to either Dr Charlann Lange, Darleen Crocker or Kara Dies, NP.   Thank you

## 2023-11-05 ENCOUNTER — Other Ambulatory Visit: Payer: Self-pay

## 2023-11-05 MED ORDER — AMLODIPINE BESYLATE 5 MG PO TABS: ORAL_TABLET | ORAL | 0 refills | Status: DC

## 2023-11-09 ENCOUNTER — Encounter: Payer: BC Managed Care – PPO | Admitting: Family Medicine

## 2023-11-09 ENCOUNTER — Ambulatory Visit: Payer: BC Managed Care – PPO | Admitting: Family Medicine

## 2023-12-20 ENCOUNTER — Ambulatory Visit (INDEPENDENT_AMBULATORY_CARE_PROVIDER_SITE_OTHER)

## 2023-12-20 VITALS — BP 114/78 | HR 84 | Temp 98.0°F | Ht 70.0 in | Wt 264.8 lb

## 2023-12-20 DIAGNOSIS — R7303 Prediabetes: Secondary | ICD-10-CM

## 2023-12-20 DIAGNOSIS — E66812 Obesity, class 2: Secondary | ICD-10-CM | POA: Diagnosis not present

## 2023-12-20 DIAGNOSIS — E782 Mixed hyperlipidemia: Secondary | ICD-10-CM

## 2023-12-20 DIAGNOSIS — R0609 Other forms of dyspnea: Secondary | ICD-10-CM | POA: Insufficient documentation

## 2023-12-20 DIAGNOSIS — I10 Essential (primary) hypertension: Secondary | ICD-10-CM | POA: Diagnosis not present

## 2023-12-20 DIAGNOSIS — Z6837 Body mass index (BMI) 37.0-37.9, adult: Secondary | ICD-10-CM | POA: Diagnosis not present

## 2023-12-20 DIAGNOSIS — G4733 Obstructive sleep apnea (adult) (pediatric): Secondary | ICD-10-CM

## 2023-12-20 DIAGNOSIS — Z125 Encounter for screening for malignant neoplasm of prostate: Secondary | ICD-10-CM | POA: Diagnosis not present

## 2023-12-20 DIAGNOSIS — L989 Disorder of the skin and subcutaneous tissue, unspecified: Secondary | ICD-10-CM

## 2023-12-20 HISTORY — DX: Other forms of dyspnea: R06.09

## 2023-12-20 LAB — CBC WITH DIFFERENTIAL/PLATELET
Basophils Absolute: 0.1 10*3/uL (ref 0.0–0.1)
Basophils Relative: 1.5 % (ref 0.0–3.0)
Eosinophils Absolute: 0.1 10*3/uL (ref 0.0–0.7)
Eosinophils Relative: 2.1 % (ref 0.0–5.0)
HCT: 46.6 % (ref 39.0–52.0)
Hemoglobin: 15.5 g/dL (ref 13.0–17.0)
Lymphocytes Relative: 31.9 % (ref 12.0–46.0)
Lymphs Abs: 1.7 10*3/uL (ref 0.7–4.0)
MCHC: 33.2 g/dL (ref 30.0–36.0)
MCV: 92.4 fl (ref 78.0–100.0)
Monocytes Absolute: 0.3 10*3/uL (ref 0.1–1.0)
Monocytes Relative: 6.5 % (ref 3.0–12.0)
Neutro Abs: 3 10*3/uL (ref 1.4–7.7)
Neutrophils Relative %: 58 % (ref 43.0–77.0)
Platelets: 194 10*3/uL (ref 150.0–400.0)
RBC: 5.05 Mil/uL (ref 4.22–5.81)
RDW: 12.8 % (ref 11.5–15.5)
WBC: 5.2 10*3/uL (ref 4.0–10.5)

## 2023-12-20 LAB — COMPREHENSIVE METABOLIC PANEL WITH GFR
ALT: 31 U/L (ref 0–53)
AST: 23 U/L (ref 0–37)
Albumin: 4.3 g/dL (ref 3.5–5.2)
Alkaline Phosphatase: 50 U/L (ref 39–117)
BUN: 10 mg/dL (ref 6–23)
CO2: 29 meq/L (ref 19–32)
Calcium: 9.2 mg/dL (ref 8.4–10.5)
Chloride: 104 meq/L (ref 96–112)
Creatinine, Ser: 0.9 mg/dL (ref 0.40–1.50)
GFR: 90.13 mL/min (ref >60.00)
Glucose, Bld: 105 mg/dL — ABNORMAL HIGH (ref 70–99)
Potassium: 4.1 meq/L (ref 3.5–5.1)
Sodium: 141 meq/L (ref 135–145)
Total Bilirubin: 0.9 mg/dL (ref 0.2–1.2)
Total Protein: 6.7 g/dL (ref 6.0–8.3)

## 2023-12-20 LAB — LIPID PANEL
Cholesterol: 146 mg/dL (ref 0–200)
HDL: 39.1 mg/dL (ref >39.00)
LDL Cholesterol: 84 mg/dL (ref 0–99)
NonHDL: 107.34
Total CHOL/HDL Ratio: 4
Triglycerides: 119 mg/dL (ref 0.0–149.0)
VLDL: 23.8 mg/dL (ref 0.0–40.0)

## 2023-12-20 LAB — VITAMIN D 25 HYDROXY (VIT D DEFICIENCY, FRACTURES): VITD: 25.74 ng/mL — ABNORMAL LOW (ref 30.00–100.00)

## 2023-12-20 LAB — PSA: PSA: 2.09 ng/mL (ref 0.10–4.00)

## 2023-12-20 LAB — HEMOGLOBIN A1C: Hgb A1c MFr Bld: 6.2 % (ref 4.6–6.5)

## 2023-12-20 MED ORDER — AMLODIPINE BESYLATE 5 MG PO TABS: ORAL_TABLET | ORAL | 3 refills | Status: AC

## 2023-12-20 NOTE — Progress Notes (Signed)
 Established Patient Office Visit   Subjective  Patient ID: Johnny Bradley, male    DOB: Mar 27, 1961  Age: 63 y.o. MRN: [id number]  Chief Complaint  Patient presents with   Transitions Of Care   Insomnia    Patient states he has an issue with sleeping. Patient states he has issues with staying asleep once he falls asleep. Patient states he has had a CPAP machine for 3 years, but has been actively using it for the last year and a half.     He  has a past medical history of Arthritis (02/03/2020), Back pain (12/09/2014), Hyperlipidemia (12/15/2014), Hypertension (12/03/2017), Obesity (01/20/2020), Shoulder pain (12/09/2014), and Sleep apnea (01/20/2020).  HPI -- Seasonal allergy: Not taking medications. Worse during pollen season, not needing medication.   -- Sleep apnea: Diagnosed in 01/20/2020. Patient reports he had tried different masks in the past. He has been sleeping on his side and has not been snoring as much. He is not using CPAP at this time. He is using a mouth piece. He has not seen a sleep specialist. He is getting about   -- Hypertension: On Amlodipine  5 mg. Patient reports he checks BP at home when he does not feel right. He denies chest pain, leg edema.   -- Hyperlipidemia: On Zetia  10 mg once a day and Pravastain 20 mg 5 days a week (had leg pain when he took 7 days a week). Has been tolerating it now. Patient is also on cardiology study group for cholesterol lowering medication. Patient reports this is a 2/3 years study and plans on finishing the study. Next appointment is on 03/12/2024.   -- BMI: 37.99: Diet: Vegetable, fruit, protein juice every day. Exercise: Walking daily for about 30 minutes day.  Patient is aware of diet, exercise improvement in achieving healthier weight. He seems to gain weight when stress is high in his personal life.  -- Aspirin  81 mg, once a day for cardiovascular protection, takes it over the counter.   -- Takes OTC fish oil.   -- Take Vitamin  D 600 U daily.   -- Scalp lesion: 3+ years. Left forearm lesion: for about a month. Does not heal.   ROS As per HPI    Objective:     BP 114/78   Pulse 84   Temp 98 F (36.7 C) (Oral)   Ht 5\' 10"  (1.778 m)   Wt 264 lb 12.8 oz (120.1 kg)   SpO2 99%   BMI 37.99 kg/m      12/20/2023   10:28 AM 05/11/2023    9:32 AM 11/07/2022    9:19 AM  Depression screen PHQ 2/9  Decreased Interest 0 0 0  Down, Depressed, Hopeless 0 0 0  PHQ - 2 Score 0 0 0  Altered sleeping 0 0 0  Tired, decreased energy 0 0 0  Change in appetite 0 0 0  Feeling bad or failure about yourself  0 0 0  Trouble concentrating 0 0 0  Moving slowly or fidgety/restless 0 0 0  Suicidal thoughts 0 0 0  PHQ-9 Score 0 0 0  Difficult doing work/chores Not difficult at all Not difficult at all Not difficult at all      12/20/2023   10:28 AM 05/11/2023    9:32 AM 11/07/2022    9:20 AM  GAD 7 : Generalized Anxiety Score  Nervous, Anxious, on Edge 0 0 0  Control/stop worrying 0 0 0  Worry too much - different things 0  0 0  Trouble relaxing 0 0 0  Restless 0 0 0  Easily annoyed or irritable 0 0 0  Afraid - awful might happen 0 0 0  Total GAD 7 Score 0 0 0  Anxiety Difficulty Not difficult at all Not difficult at all Not difficult at all    Physical Exam Constitutional:      Appearance: He is obese.  HENT:     Head: Normocephalic and atraumatic.     Right Ear: Tympanic membrane normal.     Left Ear: Tympanic membrane normal.     Mouth/Throat:     Mouth: Mucous membranes are moist.     Pharynx: No oropharyngeal exudate.  Cardiovascular:     Rate and Rhythm: Normal rate.     Pulses: Normal pulses.  Pulmonary:     Effort: Pulmonary effort is normal.     Breath sounds: Normal breath sounds. No wheezing.  Abdominal:     General: Bowel sounds are normal.     Palpations: Abdomen is soft.  Musculoskeletal:     Cervical back: Normal range of motion and neck supple.     Right lower leg: No edema.     Left lower  leg: No edema.  Skin:    General: Skin is warm.  Neurological:     Mental Status: He is alert and oriented to person, place, and time.        No results found for any visits on 12/20/23.  The 10-year ASCVD risk score (Arnett DK, et al., 2019) is: 9.5%    Assessment & Plan:  OSA (obstructive sleep apnea) Assessment & Plan: Not using CPAP at this time. He is interested in seeing sleep specialist to go through reevaluation for OSA.  Referral made. Counseled on weight loss.   Orders: -     Pulmonary Visit  Class 2 severe obesity due to excess calories with serious comorbidity and body mass index (BMI) of 37.0 to 37.9 in adult [facility]) Assessment & Plan: Chronic issue.  Patient is working on diet and exercise. He is not interested in pharmacological intervention for weight loss at this time. We will check vitamin D  level.   Orders: -     VITAMIN D  25 Hydroxy (Vit-D Deficiency, Fractures)  Primary hypertension Assessment & Plan: Chronic issue.  Adequately controlled on Amlodipine  5 mg daily, continue. Will check CMP today.   Orders: -     Comprehensive metabolic panel with GFR  Mixed hyperlipidemia Assessment & Plan: Chronic issue.  Patient will continue pravastatin  fr 5 days a week. Check lipid panel. He will also continue Zetia  10 mg daily.    Orders: -     Lipid panel  Prediabetes Assessment & Plan: Has had elevated fasting blood glucose in the past. Check A1c today.  Orders: -     Hemoglobin A1c  Prostate cancer screening Assessment & Plan: Check PSA today.   Orders: -     PSA  Skin lesions Assessment & Plan: Multiple, lesion 1: Left forearm: AK like lesion, non healing.  Scalp: present for about 3 plus years, looks like Seborrhoic keratosis. Recommend dermatology referral for potential biopsy. Referral made.   Orders: -     Ambulatory referral to Dermatology  Exertional dyspnea Assessment & Plan: Reports noticeable when he goes up and down the stairs.  Not a/w chest pain. Will check CBC to r/o anemia. Recommend weight loss, if continues to be a problem will consider checking echocardiogram for structural abnormality of heart.  Orders: -     CBC with Differential/Platelet  Other orders -     amLODIPine  Besylate; TAKE 1 TABLET BY MOUTH DAILY; **MUST CALL MD FOR APPOINTMENT FOR FUTURE REFILLS  Dispense: 90 tablet; Refill: 3    Return in about 6 months (around 06/21/2024).   Jacklin Mascot, MD

## 2023-12-20 NOTE — Assessment & Plan Note (Signed)
 Reports noticeable when he goes up and down the stairs. Not a/w chest pain. Will check CBC to r/o anemia. Recommend weight loss, if continues to be a problem will consider checking echocardiogram for structural abnormality of heart.

## 2023-12-20 NOTE — Assessment & Plan Note (Signed)
 Chronic issue.  Patient is working on diet and exercise. He is not interested in pharmacological intervention for weight loss at this time. We will check vitamin D  level.

## 2023-12-20 NOTE — Assessment & Plan Note (Signed)
 Check PSA today.

## 2023-12-20 NOTE — Assessment & Plan Note (Signed)
 Chronic issue.  Patient will continue pravastatin  fr 5 days a week. Check lipid panel. He will also continue Zetia  10 mg daily.

## 2023-12-20 NOTE — Assessment & Plan Note (Signed)
 Multiple, lesion 1: Left forearm: AK like lesion, non healing.  Scalp: present for about 3 plus years, looks like Seborrhoic keratosis. Recommend dermatology referral for potential biopsy. Referral made.

## 2023-12-20 NOTE — Assessment & Plan Note (Signed)
 Not using CPAP at this time. He is interested in seeing sleep specialist to go through reevaluation for OSA.  Referral made. Counseled on weight loss.

## 2023-12-20 NOTE — Assessment & Plan Note (Signed)
 Has had elevated fasting blood glucose in the past. Check A1c today.

## 2023-12-20 NOTE — Assessment & Plan Note (Signed)
 Chronic issue.  Adequately controlled on Amlodipine  5 mg daily, continue. Will check CMP today.

## 2023-12-21 NOTE — Telephone Encounter (Signed)
 fyi

## 2024-01-01 ENCOUNTER — Encounter (HOSPITAL_BASED_OUTPATIENT_CLINIC_OR_DEPARTMENT_OTHER): Payer: Self-pay

## 2024-03-03 ENCOUNTER — Other Ambulatory Visit: Payer: Self-pay

## 2024-03-03 ENCOUNTER — Encounter (HOSPITAL_BASED_OUTPATIENT_CLINIC_OR_DEPARTMENT_OTHER): Payer: Self-pay

## 2024-03-03 ENCOUNTER — Emergency Department (HOSPITAL_BASED_OUTPATIENT_CLINIC_OR_DEPARTMENT_OTHER): Admitting: Radiology

## 2024-03-03 ENCOUNTER — Emergency Department (HOSPITAL_BASED_OUTPATIENT_CLINIC_OR_DEPARTMENT_OTHER): Admission: EM | Admit: 2024-03-03 | Discharge: 2024-03-03 | Disposition: A | Discharge status: Home / Self Care

## 2024-03-03 DIAGNOSIS — Y92410 Unspecified street and highway as the place of occurrence of the external cause: Secondary | ICD-10-CM | POA: Diagnosis not present

## 2024-03-03 DIAGNOSIS — M545 Low back pain, unspecified: Secondary | ICD-10-CM | POA: Insufficient documentation

## 2024-03-03 DIAGNOSIS — Z7982 Long term (current) use of aspirin: Secondary | ICD-10-CM | POA: Diagnosis not present

## 2024-03-03 MED ORDER — METHOCARBAMOL 500 MG PO TABS: 500.0000 mg | ORAL_TABLET | Freq: Two times a day (BID) | ORAL | 0 refills | Status: DC

## 2024-03-03 MED ORDER — METHOCARBAMOL 500 MG PO TABS
500.0000 mg | ORAL_TABLET | Freq: Once | ORAL | Status: AC
  Administered 2024-03-03: 500 mg via ORAL
  Filled 2024-03-03: qty 1

## 2024-03-03 MED ORDER — LIDOCAINE 5 % EX PTCH: 1.0000 | MEDICATED_PATCH | CUTANEOUS | 0 refills | Status: DC

## 2024-03-03 MED ORDER — LIDOCAINE 5 % EX PTCH
1.0000 | MEDICATED_PATCH | Freq: Once | CUTANEOUS | Status: DC
  Administered 2024-03-03: 1 via TRANSDERMAL
  Filled 2024-03-03: qty 1

## 2024-03-03 NOTE — Discharge Instructions (Addendum)
 You may take Tylenol  turning with ibuprofen for baseline pain control.  We are prescribing you lidocaine  patches and muscle relaxers.  Please follow-up with your primary doctor.  Return if develop  severe headache, chest pain, shortness of breath, abdominal pain, nausea vomiting, weakness in your lower extremities, numbness in your perineum or any new or worsening symptoms that are concerning to you.
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
## 2024-03-12 ENCOUNTER — Encounter: Admitting: *Deleted

## 2024-03-12 VITALS — BP 133/74 | HR 73 | Temp 98.0°F | Resp 18 | Wt 260.0 lb

## 2024-03-12 DIAGNOSIS — Z006 Encounter for examination for normal comparison and control in clinical research program: Secondary | ICD-10-CM

## 2024-03-12 MED ORDER — STUDY - VICTORION-1 PREVENT - INCLISIRAN 300 MG/1.5 ML OR PLACEBO SQ INJECTION (PI-STUCKEY)
300.0000 mg | INJECTION | SUBCUTANEOUS | Status: AC
  Administered 2024-03-12: 300 mg via SUBCUTANEOUS
  Filled 2024-03-12: qty 1.5
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
## 2024-04-14 ENCOUNTER — Ambulatory Visit (INDEPENDENT_AMBULATORY_CARE_PROVIDER_SITE_OTHER): Payer: Self-pay | Admitting: Sleep Medicine

## 2024-04-14 ENCOUNTER — Encounter: Payer: Self-pay | Admitting: Sleep Medicine

## 2024-04-14 VITALS — BP 120/80 | HR 89 | Temp 98.1°F | Ht 70.0 in | Wt 266.8 lb

## 2024-04-14 DIAGNOSIS — G4710 Hypersomnia, unspecified: Secondary | ICD-10-CM

## 2024-04-14 DIAGNOSIS — Z6838 Body mass index (BMI) 38.0-38.9, adult: Secondary | ICD-10-CM

## 2024-04-14 DIAGNOSIS — E669 Obesity, unspecified: Secondary | ICD-10-CM

## 2024-04-14 DIAGNOSIS — G4733 Obstructive sleep apnea (adult) (pediatric): Secondary | ICD-10-CM

## 2024-04-14 DIAGNOSIS — I10 Essential (primary) hypertension: Secondary | ICD-10-CM

## 2024-04-14 DIAGNOSIS — E66812 Obesity, class 2: Secondary | ICD-10-CM

## 2024-04-14 NOTE — Progress Notes (Unsigned)
 Name:Johnny Bradley MRN: [id number] DOB: September 14, 1960   CHIEF COMPLAINT:  REASSESSMENT OF OSA   HISTORY OF PRESENT ILLNESS: Johnny Bradley is a 63 y.o. w/ a h/o OSA, HTN, hyperlipidemia and obesity who presents for reassessment of OSA. Reports that he was diagnosed with OSA several years ago and was subsequently started on CPAP therapy. Reports using CPAP therapy for a few years and then discontinued CPAP therapy. Denies any significant weight changes. Reports c/o loud snoring when he falls asleep in his recliner. Reports excessive daytime sleepiness. Reports nocturnal awakenings due to nocturia, however does not have difficulty falling back to sleep. Denies morning headaches, RLS symptoms, dream enactment, cataplexy, hypnagogic or hypnapompic hallucinations. Reports a family history of sleep apnea. Denies drowsy driving. Drinks 2-6 sodas daily, occasional alcohol use, denies tobacco or illicit drug use.   Bedtime 12 am Sleep onset 10 mins Rise time 8-10 am   EPWORTH SLEEP SCORE 5    04/14/2024   11:00 AM  Results of the Epworth flowsheet  Sitting and reading 1  Watching TV 1  Sitting, inactive in a public place (e.g. a theatre or a meeting) 0  As a passenger in a car for an hour without a break 0  Lying down to rest in the afternoon when circumstances permit 3  Sitting and talking to someone 0  Sitting quietly after a lunch without alcohol 0  In a car, while stopped for a few minutes in traffic 0  Total score 5    PAST MEDICAL HISTORY :   has a past medical history of Arthritis (02/03/2020), Back pain (12/09/2014), Hyperlipidemia (12/15/2014), Hypertension (12/03/2017), Obesity (01/20/2020), Shoulder pain (12/09/2014), and Sleep apnea.  has a past surgical history that includes Knee arthroscopy (Right); Pilonidal cyst excision (1980); Wisdom tooth extraction; Total knee arthroplasty (Right, 04/17/2022); Joint replacement (Right, 04/17/2022); and Colonoscopy with propofol  (N/A,  08/07/2022). Prior to Admission medications   Medication Sig Start Date End Date Taking? Authorizing Provider  amLODipine  (NORVASC ) 5 MG tablet TAKE 1 TABLET BY MOUTH DAILY; **MUST CALL MD FOR APPOINTMENT FOR FUTURE REFILLS 12/20/23  Yes Bair, Kalpana, MD  aspirin  EC 81 MG tablet Take 81 mg by mouth daily. Swallow whole.   Yes [provider]  ezetimibe  (ZETIA ) 10 MG tablet TAKE 1 TABLET BY MOUTH DAILY 08/16/23  Yes Walker, Caitlin S, NP  lidocaine  (LIDODERM ) 5 % Place 1 patch onto the skin daily. Remove & Discard patch within 12 hours or as directed by MD 03/03/24  Yes Neysa Caron PARAS, DO  methocarbamol  (ROBAXIN ) 500 MG tablet Take 1 tablet (500 mg total) by mouth 2 (two) times daily. 03/03/24  Yes Neysa Caron PARAS, DO  Omega-3 Fatty Acids (FISH OIL) 300 MG CAPS Take by mouth.   Yes [provider]  pravastatin  (PRAVACHOL ) 20 MG tablet TAKE 1 TABLET BY MOUTH DAILY 10/31/23  Yes Lonni Slain, MD  Study - VICTORION-1 PREVENT - inclisiran 300 mg/1.5mL or placebo SQ injection (PI-Stuckey) Inject 300 mg into the skin every 6 (six) months. For Investigational Use Only. Inject subcutaneously into the abdomen, upper arm or thigh. Do not inject into areas of active skin disease,such as sun burns, skin rashes, inflammation, or skin infections.   Yes Morris Debby BIRCH, MD   No Known Allergies  FAMILY HISTORY:  family history includes Alcohol abuse in his mother; Colon polyps in his mother; Early death (age of onset: 50) in his brother; Heart disease in his father; Panic disorder in his  son; Psoriasis in his son; Sleep apnea in his son; Stroke in his paternal grandmother. SOCIAL HISTORY:  reports that he has never smoked. He has never used smokeless tobacco. He reports current alcohol use of about 3.0 standard drinks of alcohol per week. He reports that he does not use drugs.   Review of Systems:  Gen:  Denies  fever, sweats, chills weight loss  HEENT: Denies blurred vision, double  vision, ear pain, eye pain, hearing loss, nose bleeds, sore throat Cardiac:  No dizziness, chest pain or heaviness, chest tightness,edema, No JVD Resp:   No cough, -sputum production, -shortness of breath,-wheezing, -hemoptysis,  Gi: Denies swallowing difficulty, stomach pain, nausea or vomiting, diarrhea, constipation, bowel incontinence Gu:  Denies bladder incontinence, burning urine Ext:   Denies Joint pain, stiffness or swelling Skin: Denies  skin rash, easy bruising or bleeding or hives Endoc:  Denies polyuria, polydipsia , polyphagia or weight change Psych:   Denies depression, insomnia or hallucinations  Other:  All other systems negative  VITAL SIGNS: BP 120/80   Pulse 89   Temp 98.1 F (36.7 C)   Ht 5' 10 (1.778 m)   Wt 266 lb 12.8 oz (121 kg)   SpO2 96%   BMI 38.28 kg/m    Physical Examination:   General Appearance: No distress  EYES PERRLA, EOM intact.   NECK Supple, No JVD Pulmonary: normal breath sounds, No wheezing.  CardiovascularNormal S1,S2.  No m/r/g.   Abdomen: Benign, Soft, non-tender. Skin:   warm, no rashes, no ecchymosis  Extremities: normal, no cyanosis, clubbing. Neuro:without focal findings,  speech normal  PSYCHIATRIC: Mood, affect within normal limits.   ASSESSMENT AND PLAN  OSA I suspect that OSA is likely present due to clinical presentation. Discussed the consequences of untreated sleep apnea. Advised not to drive drowsy for safety of patient and others. Will complete further evaluation with a home sleep study and follow up to review results.    HTN Stable, on current management. Following with PCP.   Obesity Counseled patient on diet and lifestyle modification.    MEDICATION ADJUSTMENTS/LABS AND TESTS ORDERED: Recommend Sleep Study   Patient  satisfied with Plan of action and management. All questions answered  Follow up to review HST results and treatment plan.   I spent a total of 41 minutes reviewing chart data,  face-to-face evaluation with the patient, counseling and coordination of care as detailed above.    Avynn Klassen, M.D.  Sleep Medicine [facility]

## 2024-04-14 NOTE — Patient Instructions (Signed)
 [name]

## 2024-04-30 ENCOUNTER — Other Ambulatory Visit: Payer: Self-pay

## 2024-04-30 MED ORDER — PRAVASTATIN SODIUM 20 MG PO TABS: 20.0000 mg | ORAL_TABLET | Freq: Every day | ORAL | 0 refills | Status: DC

## 2024-05-13 ENCOUNTER — Other Ambulatory Visit (INDEPENDENT_AMBULATORY_CARE_PROVIDER_SITE_OTHER): Payer: Self-pay

## 2024-05-13 ENCOUNTER — Ambulatory Visit (INDEPENDENT_AMBULATORY_CARE_PROVIDER_SITE_OTHER): Payer: BC Managed Care – PPO | Admitting: Orthopaedic Surgery

## 2024-05-13 DIAGNOSIS — Z96651 Presence of right artificial knee joint: Secondary | ICD-10-CM

## 2024-05-13 NOTE — Progress Notes (Signed)
 Post-Op Visit Note   Patient: Johnny Bradley           Date of Birth: Aug 22, 1960           MRN: [id number] Visit Date: 05/13/2024 PCP: Abbey Bruckner, MD   Assessment & Plan:  Chief Complaint:  Chief Complaint  Patient presents with   Right Knee - Follow-up    Right total knee arthroplasty 04/17/2022   Visit Diagnoses:  1. Status post total right knee replacement     Plan: History of Present Illness Johnny Bradley is a 63 year old male who presents for a two-year follow-up after right knee replacement surgery.  Two years post right knee replacement surgery, he experiences a sensation when kneeling but otherwise reports no discomfort. He is cautious when getting on the floor but has no restrictions in activities. He has no other complaints or issues with the right knee. He feels his left knee is functioning adequately and does not require the same care as the right knee did before surgery.  Physical Exam MUSCULOSKELETAL: Right knee scar well-healed with excellent flexibility.  Results RADIOLOGY Knee X-ray: No evidence of loosening or displacement  Assessment and Plan Status post right total knee arthroplasty Two years post-surgery with good function, occasional kneeling discomfort expected. X-rays show stable prosthesis. Well-healed scar. - Discontinue routine follow-up unless issues arise. - Optional follow-up every two to three years for x-ray evaluation. - Provided contact information for future appointments.  Follow-Up Instructions: Return if symptoms worsen or fail to improve.   Orders:  Orders Placed This Encounter  Procedures   XR Knee 1-2 Views Right   No orders of the defined types were placed in this encounter.   Imaging: XR Knee 1-2 Views Right Result Date: 05/13/2024 X-rays of the right knee demonstrate a stable right total knee replacement in good alignment    PMFS History: Patient Active Problem List   Diagnosis Date Noted   Exertional dyspnea  12/20/2023   Myalgia due to statin 04/02/2023   Nonocclusive coronary atherosclerosis of native coronary artery 04/02/2023   Prostate cancer screening 11/07/2022   Adenomatous polyp of colon 08/07/2022   Chest pain on breathing 07/13/2022   Chest pain 07/06/2022   Liver lesion 07/06/2022   Status post total left knee replacement 04/17/2022   Primary osteoarthritis of right knee 11/15/2021   Right shoulder pain 11/01/2021   Abdominal discomfort 11/01/2021   Bilateral knee pain 09/15/2020   OSA (obstructive sleep apnea) 02/03/2020   Chronic knee pain 02/03/2020   Family history of angiosarcoma 04/15/2019   Prediabetes 04/14/2019   HLD (hyperlipidemia) 10/02/2018   Subcutaneous nodule of right hand 10/02/2018   Skin lesions 05/29/2018   Hypertension 12/03/2017   Encounter for screening colonoscopy 05/25/2017   Obesity 05/25/2017   Tremor 05/25/2017   Right low back pain 12/19/2014   Past Medical History:  Diagnosis Date   Arthritis 02/03/2020   Back pain 12/09/2014   Hyperlipidemia 12/15/2014   Hypertension 12/03/2017   Obesity 01/20/2020   Shoulder pain 12/09/2014   Left   Sleep apnea    not currently on cpap    Family History  Problem Relation Age of Onset   Alcohol abuse Mother    Colon polyps Mother    Heart disease Father    Early death Brother 45       angiosarcoma   Stroke Paternal Grandmother    Sleep apnea Son    Psoriasis Son    Panic disorder Son  Past Surgical History:  Procedure Laterality Date   COLONOSCOPY WITH PROPOFOL  N/A 08/07/2022   Procedure: COLONOSCOPY WITH PROPOFOL ;  Surgeon: Therisa Bi, MD;  Location[facility] ENDOSCOPY;  Service: Gastroenterology;  Laterality: N/A;   JOINT REPLACEMENT Right 04/17/2022   knee   KNEE ARTHROSCOPY Right    [facility] Torn   PILONIDAL CYST EXCISION  1980   TOTAL KNEE ARTHROPLASTY Right 04/17/2022   Procedure: RIGHT TOTAL KNEE ARTHROPLASTY;  Surgeon: Jerri Kay HERO, MD;  Location: [facility] OR;  Service: Orthopedics;   Laterality: Right;   WISDOM TOOTH EXTRACTION     Social History   Occupational History   Not on file  Tobacco Use   Smoking status: Never   Smokeless tobacco: Never  Vaping Use   Vaping status: Never Used  Substance and Sexual Activity   Alcohol use: Yes    Alcohol/week: 3.0 standard drinks of alcohol    Types: 3 Cans of beer per week    Comment: On weekends- [employer] [address]    Drug use: No   Sexual activity: Yes    Partners: Female    Comment: Wife

## 2024-05-17 ENCOUNTER — Encounter

## 2024-05-17 DIAGNOSIS — G4733 Obstructive sleep apnea (adult) (pediatric): Secondary | ICD-10-CM

## 2024-05-28 DIAGNOSIS — G4733 Obstructive sleep apnea (adult) (pediatric): Secondary | ICD-10-CM | POA: Diagnosis not present

## 2024-05-29 ENCOUNTER — Ambulatory Visit: Payer: Self-pay

## 2024-05-29 DIAGNOSIS — G4733 Obstructive sleep apnea (adult) (pediatric): Secondary | ICD-10-CM

## 2024-05-29 NOTE — Telephone Encounter (Signed)
-----   Message from [facility] D REDDY sent at 05/29/2024  1:18 PM EDT ----- Please notify patient that HST revealed severe OSA, recommend proceeding with APAP therapy set to 4-16 cm H2O, EPR 3 with the Airtouch N30i nasal mask. Please also schedule a 3 month follow up visit  if patient wishes to proceed with CPAP therapy. Thanks   ----- Message ----- From: Vannie Donzell RAMAN Sent: 05/29/2024   7:33 AM EDT To: Pallavi D Reddy, MD

## 2024-06-23 ENCOUNTER — Encounter: Payer: Self-pay | Admitting: Radiology

## 2024-06-26 ENCOUNTER — Ambulatory Visit (INDEPENDENT_AMBULATORY_CARE_PROVIDER_SITE_OTHER)

## 2024-06-26 ENCOUNTER — Ambulatory Visit

## 2024-06-26 VITALS — BP 110/80 | HR 84 | Temp 98.7°F | Ht 70.0 in | Wt 265.0 lb

## 2024-06-26 DIAGNOSIS — Z6838 Body mass index (BMI) 38.0-38.9, adult: Secondary | ICD-10-CM

## 2024-06-26 DIAGNOSIS — E782 Mixed hyperlipidemia: Secondary | ICD-10-CM | POA: Diagnosis not present

## 2024-06-26 DIAGNOSIS — M545 Low back pain, unspecified: Secondary | ICD-10-CM | POA: Diagnosis not present

## 2024-06-26 DIAGNOSIS — I10 Essential (primary) hypertension: Secondary | ICD-10-CM

## 2024-06-26 DIAGNOSIS — G8929 Other chronic pain: Secondary | ICD-10-CM

## 2024-06-26 DIAGNOSIS — R1084 Generalized abdominal pain: Secondary | ICD-10-CM | POA: Diagnosis not present

## 2024-06-26 DIAGNOSIS — Z23 Encounter for immunization: Secondary | ICD-10-CM

## 2024-06-26 DIAGNOSIS — E66812 Obesity, class 2: Secondary | ICD-10-CM

## 2024-06-26 DIAGNOSIS — G4733 Obstructive sleep apnea (adult) (pediatric): Secondary | ICD-10-CM

## 2024-06-26 MED ORDER — PRAVASTATIN SODIUM 20 MG PO TABS
20.0000 mg | ORAL_TABLET | Freq: Every day | ORAL | 3 refills | Status: AC
Start: 2024-06-26 — End: ?

## 2024-06-26 NOTE — Assessment & Plan Note (Addendum)
 Check fasting lipid panel, continue zetia  10 mg, Pravastatin  20 mg daily.  Orders:   Comp Met (CMET); Future   Lipid panel; Future   pravastatin  (PRAVACHOL ) 20 MG tablet; Take 1 tablet (20 mg total) by mouth at bedtime.
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
## 2024-06-26 NOTE — Patient Instructions (Signed)
-   As discussed during your visit referral to physical therapy and ortho was done. Recommend follow up with me if pain worsens, or emergent evaluation  in ED if red flag symptoms occurs including but not limited to: bowel/bladder incontinence, saddle anesthesia, weakness of lower limbs, radiating lower legs pain, fever, chills.

## 2024-06-26 NOTE — Assessment & Plan Note (Addendum)
 BP within goal on Amlodipine  5 mg daily, continue.

## 2024-06-26 NOTE — Assessment & Plan Note (Addendum)
 Weight stable. Discussed dietary habits and challenges. Encouraged continued healthy eating habits and exercise. Not interested in pharmacological intervention for weight loss at this time.

## 2024-06-26 NOTE — Assessment & Plan Note (Addendum)
 Managed with CPAP. Improved adherence and tolerance since his visit with sleep specialist. Continue nightly CPAP use.

## 2024-07-03 ENCOUNTER — Ambulatory Visit: Payer: Self-pay

## 2024-07-03 ENCOUNTER — Other Ambulatory Visit (INDEPENDENT_AMBULATORY_CARE_PROVIDER_SITE_OTHER)

## 2024-07-03 DIAGNOSIS — E782 Mixed hyperlipidemia: Secondary | ICD-10-CM | POA: Diagnosis not present

## 2024-07-03 DIAGNOSIS — R1084 Generalized abdominal pain: Secondary | ICD-10-CM

## 2024-07-03 LAB — COMPREHENSIVE METABOLIC PANEL WITH GFR
ALT: 25 U/L (ref 0–53)
AST: 21 U/L (ref 0–37)
Albumin: 4.4 g/dL (ref 3.5–5.2)
Alkaline Phosphatase: 45 U/L (ref 39–117)
BUN: 10 mg/dL (ref 6–23)
CO2: 33 meq/L — ABNORMAL HIGH (ref 19–32)
Calcium: 9 mg/dL (ref 8.4–10.5)
Chloride: 101 meq/L (ref 96–112)
Creatinine, Ser: 0.96 mg/dL (ref 0.40–1.50)
GFR: 83.1 mL/min (ref >60.00)
Glucose, Bld: 103 mg/dL — ABNORMAL HIGH (ref 70–99)
Potassium: 4.2 meq/L (ref 3.5–5.1)
Sodium: 141 meq/L (ref 135–145)
Total Bilirubin: 0.8 mg/dL (ref 0.2–1.2)
Total Protein: 6.7 g/dL (ref 6.0–8.3)

## 2024-07-03 LAB — LIPID PANEL
Cholesterol: 153 mg/dL (ref 0–200)
HDL: 45.7 mg/dL (ref >39.00)
LDL Cholesterol: 83 mg/dL (ref 0–99)
NonHDL: 107.06
Total CHOL/HDL Ratio: 3
Triglycerides: 122 mg/dL (ref 0.0–149.0)
VLDL: 24.4 mg/dL (ref 0.0–40.0)

## 2024-07-03 LAB — LIPASE: Lipase: 21 U/L (ref 11.0–59.0)

## 2024-07-21 ENCOUNTER — Ambulatory Visit: Admitting: Physical Therapy

## 2024-07-21 ENCOUNTER — Encounter: Payer: Self-pay | Admitting: Physical Therapy

## 2024-07-21 DIAGNOSIS — M5459 Other low back pain: Secondary | ICD-10-CM | POA: Diagnosis not present
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
## 2024-08-08 ENCOUNTER — Other Ambulatory Visit (HOSPITAL_BASED_OUTPATIENT_CLINIC_OR_DEPARTMENT_OTHER): Payer: Self-pay | Admitting: Family

## 2024-09-08 ENCOUNTER — Encounter

## 2024-09-10 ENCOUNTER — Encounter: Admitting: *Deleted

## 2024-09-10 VITALS — BP 106/64 | HR 67 | Temp 97.3°F | Resp 18 | Wt 263.0 lb

## 2024-09-10 DIAGNOSIS — Z006 Encounter for examination for normal comparison and control in clinical research program: Secondary | ICD-10-CM

## 2024-09-10 MED ORDER — STUDY - VICTORION-1 PREVENT - INCLISIRAN 300 MG/1.5 ML OR PLACEBO SQ INJECTION (PI-CHRISTOPHER)
300.0000 mg | INJECTION | SUBCUTANEOUS | Status: AC
  Filled 2024-09-10: qty 1.5

## 2024-09-10 NOTE — Research (Signed)
 DATE: 10-Sep-2024  SUBJECT ID: [id number]   Visit:V5  Prior or concomitant medications reviewed [x]   Lipid lowering medications reviewed [x]   Surgical history reviewed [x]   TIME: BP: PULSE:  0912 114/69 66  0914 122/75 65  0915 106/64 67  Mean BP: 114/69   Mean Pulse: 66  Labs collected at:0922  Fasting Lipid Profile [x]  Fasting Lp(a) []  Liver function tests []  Exploratory biomarkers []   AE/SAE assessment completed [x]   Study drug administered [x]   Endpoint assessment completed [x]   Johnny Bradley is here for Visit 5 of V1P. He reports no changes in his meds, no abd pain,and no visits to the Ed or urgent care since last seen. Scheduled next appointment for July 22 at 0900. Injection given in right lower abd tol well at 0930. Kit number [id number]. Reviewed  lifestyle instructions, voices understanding.  Current Medications[1]      [1]  Current Outpatient Medications:    amLODipine  (NORVASC ) 5 MG tablet, TAKE 1 TABLET BY MOUTH DAILY; **MUST CALL MD FOR APPOINTMENT FOR FUTURE REFILLS, Disp: 90 tablet, Rfl: 3   aspirin  EC 81 MG tablet, Take 81 mg by mouth daily. Swallow whole., Disp: , Rfl:    ezetimibe  (ZETIA ) 10 MG tablet, TAKE 1 TABLET BY MOUTH DAILY, Disp: 30 tablet, Rfl: 0   Omega-3 Fatty Acids (FISH OIL) 300 MG CAPS, Take by mouth., Disp: , Rfl:    pravastatin  (PRAVACHOL ) 20 MG tablet, Take 1 tablet (20 mg total) by mouth at bedtime., Disp: 90 tablet, Rfl: 3   Study - VICTORION-1 PREVENT - inclisiran 300 mg/1.5mL or placebo SQ injection (PI-Stuckey), Inject 300 mg into the skin every 6 (six) months. For Investigational Use Only. Inject subcutaneously into the abdomen, upper arm or thigh. Do not inject into areas of active skin disease,such as sun burns, skin rashes, inflammation, or skin infections., Disp: , Rfl:   Current Facility-Administered Medications:    Study - VICTORION-1 PREVENT - inclisiran 300 mg/1.5mL or placebo SQ injection (PI-Christopher), 300 mg,  Subcutaneous, Q6 months,    Study - VICTORION-1 PREVENT - inclisiran 300 mg/1.5mL or placebo SQ injection (PI-Stuckey), 300 mg, Subcutaneous, Q6 months, , 300 mg at 09/14/23 9150   Study - VICTORION-1 PREVENT - inclisiran 300 mg/1.5mL or placebo SQ injection (PI-Stuckey), 300 mg, Subcutaneous, Q6 months, , 300 mg at 03/12/24 [phone]

## 2024-09-19 ENCOUNTER — Other Ambulatory Visit: Payer: Self-pay | Admitting: Cardiology

## 2024-09-23 MED ORDER — EZETIMIBE 10 MG PO TABS: 10.0000 mg | ORAL_TABLET | Freq: Every day | ORAL | 3 refills | Status: AC

## 2024-12-19 ENCOUNTER — Ambulatory Visit (HOSPITAL_BASED_OUTPATIENT_CLINIC_OR_DEPARTMENT_OTHER): Admitting: Cardiology

## 2024-12-25 ENCOUNTER — Ambulatory Visit

## 2025-03-11 ENCOUNTER — Encounter
